# Patient Record
Sex: Female | Born: 1950
Health system: Southern US, Community
[De-identification: ages and names within clinical notes are randomized; demographics above are authoritative.]

## PROBLEM LIST (undated history)

## (undated) DIAGNOSIS — M1990 Unspecified osteoarthritis, unspecified site: Secondary | ICD-10-CM

## (undated) DIAGNOSIS — E119 Type 2 diabetes mellitus without complications: Secondary | ICD-10-CM

## (undated) HISTORY — DX: Unspecified osteoarthritis, unspecified site: M19.90

## (undated) HISTORY — DX: Type 2 diabetes mellitus without complications: E11.9

## (undated) HISTORY — PX: TUBAL LIGATION: SHX77

---

## 2004-04-08 ENCOUNTER — Emergency Department (HOSPITAL_COMMUNITY): Admission: EM | Admit: 2004-04-08 | Discharge: 2004-04-08 | Payer: Self-pay | Admitting: Family Medicine

## 2004-04-13 ENCOUNTER — Emergency Department (HOSPITAL_COMMUNITY): Admission: EM | Admit: 2004-04-13 | Discharge: 2004-04-13 | Payer: Self-pay | Admitting: Family Medicine

## 2005-12-23 ENCOUNTER — Encounter: Admission: RE | Admit: 2005-12-23 | Discharge: 2005-12-23 | Payer: Self-pay | Admitting: Internal Medicine

## 2006-12-24 ENCOUNTER — Encounter: Admission: RE | Admit: 2006-12-24 | Discharge: 2006-12-24 | Payer: Self-pay | Admitting: Internal Medicine

## 2007-12-27 ENCOUNTER — Encounter: Admission: RE | Admit: 2007-12-27 | Discharge: 2007-12-27 | Payer: Self-pay | Admitting: Internal Medicine

## 2009-01-29 ENCOUNTER — Encounter: Admission: RE | Admit: 2009-01-29 | Discharge: 2009-01-29 | Payer: Self-pay | Admitting: Internal Medicine

## 2009-02-28 ENCOUNTER — Encounter: Admission: RE | Admit: 2009-02-28 | Discharge: 2009-02-28 | Payer: Self-pay | Admitting: Obstetrics and Gynecology

## 2009-04-18 ENCOUNTER — Ambulatory Visit: Admission: RE | Admit: 2009-04-18 | Discharge: 2009-04-18 | Payer: Self-pay | Admitting: Gynecologic Oncology

## 2010-02-04 ENCOUNTER — Encounter: Admission: RE | Admit: 2010-02-04 | Discharge: 2010-02-04 | Payer: Self-pay | Admitting: Internal Medicine

## 2011-01-11 ENCOUNTER — Other Ambulatory Visit: Payer: Self-pay | Admitting: Internal Medicine

## 2011-01-11 DIAGNOSIS — Z1231 Encounter for screening mammogram for malignant neoplasm of breast: Secondary | ICD-10-CM

## 2011-02-05 ENCOUNTER — Ambulatory Visit
Admission: RE | Admit: 2011-02-05 | Discharge: 2011-02-05 | Disposition: A | Discharge status: Home / Self Care | Payer: 59 | Source: Ambulatory Visit | Attending: Internal Medicine | Admitting: Internal Medicine

## 2011-02-05 DIAGNOSIS — Z1231 Encounter for screening mammogram for malignant neoplasm of breast: Secondary | ICD-10-CM
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
## 2012-01-05 ENCOUNTER — Other Ambulatory Visit: Payer: Self-pay | Admitting: Internal Medicine

## 2012-01-05 DIAGNOSIS — Z1231 Encounter for screening mammogram for malignant neoplasm of breast: Secondary | ICD-10-CM

## 2012-02-09 ENCOUNTER — Ambulatory Visit
Admission: RE | Admit: 2012-02-09 | Discharge: 2012-02-09 | Disposition: A | Discharge status: Home / Self Care | Payer: 59 | Source: Ambulatory Visit | Attending: Internal Medicine | Admitting: Internal Medicine

## 2012-02-09 DIAGNOSIS — Z1231 Encounter for screening mammogram for malignant neoplasm of breast: Secondary | ICD-10-CM

## 2012-08-25 ENCOUNTER — Ambulatory Visit
Admission: RE | Admit: 2012-08-25 | Discharge: 2012-08-25 | Disposition: A | Discharge status: Home / Self Care | Payer: 59 | Source: Ambulatory Visit | Attending: Internal Medicine | Admitting: Internal Medicine

## 2012-08-25 ENCOUNTER — Other Ambulatory Visit: Payer: Self-pay | Admitting: Internal Medicine

## 2012-08-25 DIAGNOSIS — R05 Cough: Secondary | ICD-10-CM

## 2012-09-08 ENCOUNTER — Ambulatory Visit (HOSPITAL_COMMUNITY)
Admission: RE | Admit: 2012-09-08 | Discharge: 2012-09-08 | Disposition: A | Discharge status: Home / Self Care | Payer: 59 | Source: Ambulatory Visit | Attending: Internal Medicine | Admitting: Internal Medicine

## 2012-09-08 DIAGNOSIS — R1310 Dysphagia, unspecified: Secondary | ICD-10-CM | POA: Insufficient documentation

## 2012-09-08 NOTE — Procedures (Signed)
Objective Swallowing Evaluation: Modified Barium Swallowing Study  Patient Details  Name: [name] W Carra MRN: [id number] Date of Birth: February 10, 1951  Today's Date: 09/08/2012 Time: 1140-1200 SLP Time Calculation (min): 20 min  Past Medical History: No past medical history on file. Past Surgical History: No past surgical history on file. HPI:  61 year old female with medical history of prediabetes seen for outpatient MBS due to recent c/o new onset dry cough.  After further questioning, patient also reports new onset hoarse vocal quality and occassional globus.      Assessment / Plan / Recommendation Clinical Impression  Dysphagia Diagnosis: Within Functional Limits Clinical impression: Patient presents with a functional oropharyngeal swallow. Full airway protection noted with no penetration or aspiration. Occassional delayed swallow initiation and trace pharyngeal coating noted which is within normal limits. This SLP suspects that symtoms may be due to a primary esophageal dysfunction, ? GERD vs LPR, specifically due to her dry cough and hoarse vocal quality. Defer further w/o and/or medication management of symptoms to MD. No SLP f/u indicated.     Treatment Recommendation  No treatment recommended at this time    Diet Recommendation Regular;Thin liquid   Liquid Administration via: Straw;Cup Medication Administration: Whole meds with liquid Supervision: Patient able to self feed Compensations: Slow rate;Small sips/bites Postural Changes and/or Swallow Maneuvers: Seated upright 90 degrees;Upright 30-60 min after meal    Other  Recommendations Oral Care Recommendations: Oral care BID   Follow Up Recommendations  None             General HPI: 61 year old female with medical history of prediabetes seen for outpatient MBS due to recent c/o new onset dry cough.  After further questioning, patient also reports new onset hoarse vocal quality and occassional globus.  Type of Study:  Modified Barium Swallowing Study Reason for Referral: Objectively evaluate swallowing function Diet Prior to this Study: Regular;Thin liquids Temperature Spikes Noted: No Respiratory Status: Room air History of Recent Intubation: No Behavior/Cognition: Alert;Cooperative;Pleasant mood Oral Cavity - Dentition: Adequate natural dentition Oral Motor / Sensory Function: Within functional limits Self-Feeding Abilities: Able to feed self Patient Positioning: Upright in chair Baseline Vocal Quality: Clear Volitional Cough: Strong Volitional Swallow: Able to elicit Anatomy: Within functional limits Pharyngeal Secretions: Not observed secondary MBS    Reason for Referral Objectively evaluate swallowing function   Oral Phase Oral Preparation/Oral Phase Oral Phase: WFL   Pharyngeal Phase Pharyngeal Phase Pharyngeal Phase: Within functional limits  Cervical Esophageal Phase    GO  Ferdinand Lango MA, CCC-SLP [phone]   Cervical Esophageal Phase Cervical Esophageal Phase: [facility]         Faizon Capozzi Meryl 09/08/2012, 1:57 PM

## 2012-12-30 ENCOUNTER — Other Ambulatory Visit: Payer: Self-pay | Admitting: Internal Medicine

## 2012-12-30 DIAGNOSIS — Z1231 Encounter for screening mammogram for malignant neoplasm of breast: Secondary | ICD-10-CM

## 2013-02-09 ENCOUNTER — Ambulatory Visit
Admission: RE | Admit: 2013-02-09 | Discharge: 2013-02-09 | Disposition: A | Discharge status: Home / Self Care | Payer: 59 | Source: Ambulatory Visit | Attending: Internal Medicine | Admitting: Internal Medicine

## 2013-02-09 DIAGNOSIS — Z1231 Encounter for screening mammogram for malignant neoplasm of breast: Secondary | ICD-10-CM

## 2014-01-10 ENCOUNTER — Other Ambulatory Visit: Payer: Self-pay

## 2014-01-10 DIAGNOSIS — Z1231 Encounter for screening mammogram for malignant neoplasm of breast: Secondary | ICD-10-CM

## 2014-02-13 ENCOUNTER — Ambulatory Visit
Admission: RE | Admit: 2014-02-13 | Discharge: 2014-02-13 | Disposition: A | Discharge status: Home / Self Care | Payer: 59 | Source: Ambulatory Visit

## 2014-02-13 DIAGNOSIS — Z1231 Encounter for screening mammogram for malignant neoplasm of breast: Secondary | ICD-10-CM

## 2015-01-11 ENCOUNTER — Other Ambulatory Visit: Payer: Self-pay

## 2015-01-11 DIAGNOSIS — Z1231 Encounter for screening mammogram for malignant neoplasm of breast: Secondary | ICD-10-CM

## 2015-02-14 ENCOUNTER — Ambulatory Visit
Admission: RE | Admit: 2015-02-14 | Discharge: 2015-02-14 | Disposition: A | Discharge status: Home / Self Care | Payer: 59 | Source: Ambulatory Visit

## 2015-02-14 ENCOUNTER — Encounter (INDEPENDENT_AMBULATORY_CARE_PROVIDER_SITE_OTHER): Payer: Self-pay

## 2015-02-14 DIAGNOSIS — Z1231 Encounter for screening mammogram for malignant neoplasm of breast: Secondary | ICD-10-CM

## 2015-06-14 ENCOUNTER — Other Ambulatory Visit: Payer: Self-pay | Admitting: Obstetrics and Gynecology

## 2015-06-14 NOTE — Anesthesia Preprocedure Evaluation (Addendum)
Anesthesia Evaluation  Patient identified by MRN, date of birth, ID band Patient awake    Reviewed: Allergy & Precautions, NPO status , Patient's Chart, lab work & pertinent test results, reviewed documented beta blocker date and time   Airway Mallampati: II   Neck ROM: Full    Dental  (+) Partial Upper, Dental Advisory Given   Pulmonary neg pulmonary ROS, former smoker,  breath sounds clear to auscultation        Cardiovascular negative cardio ROS  Rhythm:Regular     Neuro/Psych negative neurological ROS  negative psych ROS   GI/Hepatic negative GI ROS, Neg liver ROS,   Endo/Other  diabetes, Oral Hypoglycemic Agents  Renal/GU negative Renal ROS     Musculoskeletal   Abdominal (+)  Abdomen: soft.    Peds  Hematology 12/36   Anesthesia Other Findings   Reproductive/Obstetrics                         Anesthesia Physical Anesthesia Plan  ASA: II  Anesthesia Plan: General   Post-op Pain Management:    Induction: Intravenous  Airway Management Planned: Oral ETT  Additional Equipment:   Intra-op Plan:   Post-operative Plan: Extubation in OR  Informed Consent: I have reviewed the patients History and Physical, chart, labs and discussed the procedure including the risks, benefits and alternatives for the proposed anesthesia with the patient or authorized representative who has indicated his/her understanding and acceptance.     Plan Discussed with:   Anesthesia Plan Comments:         Anesthesia Quick Evaluation

## 2015-06-15 ENCOUNTER — Encounter (HOSPITAL_COMMUNITY)
Admission: RE | Admit: 2015-06-15 | Discharge: 2015-06-15 | Disposition: A | Discharge status: Home / Self Care | Payer: 59 | Source: Ambulatory Visit | Attending: Obstetrics and Gynecology | Admitting: Obstetrics and Gynecology

## 2015-06-15 ENCOUNTER — Other Ambulatory Visit: Payer: Self-pay

## 2015-06-15 ENCOUNTER — Encounter (HOSPITAL_COMMUNITY): Payer: Self-pay

## 2015-06-15 DIAGNOSIS — Z01818 Encounter for other preprocedural examination: Secondary | ICD-10-CM | POA: Diagnosis not present

## 2015-06-15 LAB — BASIC METABOLIC PANEL
Anion gap: 7 (ref 5–15)
BUN: 17 mg/dL (ref 6–20)
CO2: 26 mmol/L (ref 22–32)
Calcium: 9.6 mg/dL (ref 8.9–10.3)
Chloride: 106 mmol/L (ref 101–111)
Creatinine, Ser: 0.78 mg/dL (ref 0.44–1.00)
Glucose, Bld: 132 mg/dL — ABNORMAL HIGH (ref 65–99)
Potassium: 4.2 mmol/L (ref 3.5–5.1)
Sodium: 139 mmol/L (ref 135–145)

## 2015-06-15 LAB — CBC
HCT: 36.2 % (ref 36.0–46.0)
Hemoglobin: 12.5 g/dL (ref 12.0–15.0)
MCH: 29.6 pg (ref 26.0–34.0)
MCHC: 34.5 g/dL (ref 30.0–36.0)
MCV: 85.6 fL (ref 78.0–100.0)
PLATELETS: 218 10*3/uL (ref 150–400)
RBC: 4.23 MIL/uL (ref 3.87–5.11)
RDW: 12.2 % (ref 11.5–15.5)
WBC: 6.6 10*3/uL (ref 4.0–10.5)

## 2015-06-15 NOTE — Patient Instructions (Signed)
Your procedure is scheduled on:  June 21, 2015  Enter through the Main Entrance of [facility] at:  11:15 am    Pick up the phone at the desk and dial [phone].  Call this number if you have problems the morning of surgery: [phone].  Remember: Do NOT eat food:  After midnight on Wednesday  Do NOT drink clear liquids after:  0845 day of surgery  Take these medicines the morning of surgery with a SIP OF WATER:  None  DO NOT TAKE METFORMIN 24 HOURS PRIOR TO SURGERY   Do NOT wear jewelry (body piercing), metal hair clips/bobby pins, make-up, or nail polish. Do NOT wear lotions, powders, or perfumes.  You may wear deoderant. Do NOT shave for 48 hours prior to surgery. Do NOT bring valuables to the hospital. Contacts, dentures, or bridgework may not be worn into surgery. Have a responsible adult drive you home and stay with you for 24 hours after your procedure

## 2015-06-21 ENCOUNTER — Ambulatory Visit (HOSPITAL_COMMUNITY)
Admission: RE | Admit: 2015-06-21 | Discharge: 2015-06-21 | Disposition: A | Discharge status: Home / Self Care | Payer: 59 | Source: Ambulatory Visit | Attending: Obstetrics and Gynecology | Admitting: Obstetrics and Gynecology

## 2015-06-21 ENCOUNTER — Ambulatory Visit (HOSPITAL_COMMUNITY): Payer: 59 | Admitting: Anesthesiology

## 2015-06-21 ENCOUNTER — Encounter (HOSPITAL_COMMUNITY): Payer: Self-pay | Admitting: Emergency Medicine

## 2015-06-21 ENCOUNTER — Encounter (HOSPITAL_COMMUNITY)
Admission: RE | Disposition: A | Discharge status: Home / Self Care | Payer: Self-pay | Source: Ambulatory Visit | Attending: Obstetrics and Gynecology

## 2015-06-21 DIAGNOSIS — D271 Benign neoplasm of left ovary: Secondary | ICD-10-CM | POA: Diagnosis not present

## 2015-06-21 DIAGNOSIS — N8320 Unspecified ovarian cysts: Secondary | ICD-10-CM | POA: Diagnosis present

## 2015-06-21 DIAGNOSIS — Z79899 Other long term (current) drug therapy: Secondary | ICD-10-CM | POA: Diagnosis not present

## 2015-06-21 DIAGNOSIS — N950 Postmenopausal bleeding: Secondary | ICD-10-CM | POA: Diagnosis not present

## 2015-06-21 DIAGNOSIS — N840 Polyp of corpus uteri: Secondary | ICD-10-CM | POA: Insufficient documentation

## 2015-06-21 DIAGNOSIS — Z87891 Personal history of nicotine dependence: Secondary | ICD-10-CM | POA: Diagnosis not present

## 2015-06-21 DIAGNOSIS — E119 Type 2 diabetes mellitus without complications: Secondary | ICD-10-CM | POA: Diagnosis not present

## 2015-06-21 DIAGNOSIS — N838 Other noninflammatory disorders of ovary, fallopian tube and broad ligament: Secondary | ICD-10-CM | POA: Diagnosis not present

## 2015-06-21 DIAGNOSIS — Z9851 Tubal ligation status: Secondary | ICD-10-CM | POA: Insufficient documentation

## 2015-06-21 HISTORY — PX: ROBOTIC ASSISTED SALPINGO OOPHERECTOMY: SHX6082

## 2015-06-21 HISTORY — PX: DILATATION & CURRETTAGE/HYSTEROSCOPY WITH RESECTOCOPE: SHX5572

## 2015-06-21 LAB — GLUCOSE, CAPILLARY: Glucose-Capillary: 133 mg/dL — ABNORMAL HIGH (ref 65–99)

## 2015-06-21 SURGERY — ROBOTIC ASSISTED SALPINGO OOPHORECTOMY
Anesthesia: General | Site: Vagina

## 2015-06-21 MED ORDER — GLYCOPYRROLATE 0.2 MG/ML IJ SOLN
INTRAMUSCULAR | Status: AC
  Filled 2015-06-21: qty 3

## 2015-06-21 MED ORDER — OXYCODONE-ACETAMINOPHEN 5-325 MG PO TABS: 1.0000 | ORAL_TABLET | Freq: Four times a day (QID) | ORAL | Status: DC | PRN

## 2015-06-21 MED ORDER — ARTIFICIAL TEARS OP OINT
TOPICAL_OINTMENT | OPHTHALMIC | Status: AC
Start: 2015-06-21 — End: 2015-06-21
  Filled 2015-06-21: qty 3.5

## 2015-06-21 MED ORDER — GLYCOPYRROLATE 0.2 MG/ML IJ SOLN
INTRAMUSCULAR | Status: DC | PRN
  Administered 2015-06-21: 0.6 mg via INTRAVENOUS

## 2015-06-21 MED ORDER — BUPIVACAINE HCL (PF) 0.25 % IJ SOLN
INTRAMUSCULAR | Status: DC | PRN
  Administered 2015-06-21: 15 mL

## 2015-06-21 MED ORDER — GLYCINE 1.5 % IR SOLN
Status: DC | PRN
  Administered 2015-06-21: 3000 mL

## 2015-06-21 MED ORDER — LACTATED RINGERS IR SOLN
Status: DC | PRN
  Administered 2015-06-21: 3000 mL

## 2015-06-21 MED ORDER — SODIUM CHLORIDE 0.9 % IR SOLN
Status: DC | PRN
  Administered 2015-06-21: 3000 mL

## 2015-06-21 MED ORDER — PROMETHAZINE HCL 25 MG/ML IJ SOLN
6.2500 mg | INTRAMUSCULAR | Status: DC | PRN
Start: 2015-06-21 — End: 2015-06-21

## 2015-06-21 MED ORDER — ONDANSETRON HCL 4 MG/2ML IJ SOLN
INTRAMUSCULAR | Status: DC | PRN
  Administered 2015-06-21: 4 mg via INTRAVENOUS

## 2015-06-21 MED ORDER — MIDAZOLAM HCL 2 MG/2ML IJ SOLN
INTRAMUSCULAR | Status: AC
  Filled 2015-06-21: qty 2

## 2015-06-21 MED ORDER — PROPOFOL 10 MG/ML IV BOLUS
INTRAVENOUS | Status: DC | PRN
  Administered 2015-06-21: 200 mg via INTRAVENOUS

## 2015-06-21 MED ORDER — NEOSTIGMINE METHYLSULFATE 10 MG/10ML IV SOLN
INTRAVENOUS | Status: AC
  Filled 2015-06-21: qty 1

## 2015-06-21 MED ORDER — BUPIVACAINE HCL (PF) 0.25 % IJ SOLN
INTRAMUSCULAR | Status: AC
  Filled 2015-06-21: qty 30

## 2015-06-21 MED ORDER — ONDANSETRON HCL 4 MG/2ML IJ SOLN
INTRAMUSCULAR | Status: AC
  Filled 2015-06-21: qty 2

## 2015-06-21 MED ORDER — HEPARIN SODIUM (PORCINE) 5000 UNIT/ML IJ SOLN
INTRAMUSCULAR | Status: AC
  Filled 2015-06-21: qty 1

## 2015-06-21 MED ORDER — SODIUM CHLORIDE 0.9 % IJ SOLN
INTRAMUSCULAR | Status: AC
  Filled 2015-06-21: qty 50

## 2015-06-21 MED ORDER — FENTANYL CITRATE (PF) 100 MCG/2ML IJ SOLN
INTRAMUSCULAR | Status: AC
  Filled 2015-06-21: qty 2

## 2015-06-21 MED ORDER — ROCURONIUM BROMIDE 100 MG/10ML IV SOLN
INTRAVENOUS | Status: DC | PRN
  Administered 2015-06-21 (×2): 10 mg via INTRAVENOUS
  Administered 2015-06-21: 50 mg via INTRAVENOUS

## 2015-06-21 MED ORDER — PHENYLEPHRINE 40 MCG/ML (10ML) SYRINGE FOR IV PUSH (FOR BLOOD PRESSURE SUPPORT)
PREFILLED_SYRINGE | INTRAVENOUS | Status: AC
  Filled 2015-06-21: qty 10

## 2015-06-21 MED ORDER — LACTATED RINGERS IV SOLN
INTRAVENOUS | Status: DC
  Administered 2015-06-21 (×2): via INTRAVENOUS

## 2015-06-21 MED ORDER — FENTANYL CITRATE (PF) 100 MCG/2ML IJ SOLN
INTRAMUSCULAR | Status: DC | PRN
  Administered 2015-06-21 (×6): 50 ug via INTRAVENOUS

## 2015-06-21 MED ORDER — MEPERIDINE HCL 25 MG/ML IJ SOLN: 6.2500 mg | INTRAMUSCULAR | Status: DC | PRN

## 2015-06-21 MED ORDER — DEXAMETHASONE SODIUM PHOSPHATE 4 MG/ML IJ SOLN
INTRAMUSCULAR | Status: DC | PRN
  Administered 2015-06-21: 4 mg via INTRAVENOUS

## 2015-06-21 MED ORDER — ROPIVACAINE HCL 5 MG/ML IJ SOLN
INTRAMUSCULAR | Status: AC
  Filled 2015-06-21: qty 30

## 2015-06-21 MED ORDER — LIDOCAINE HCL (CARDIAC) 20 MG/ML IV SOLN
INTRAVENOUS | Status: DC | PRN
  Administered 2015-06-21: 50 mg via INTRAVENOUS

## 2015-06-21 MED ORDER — DEXAMETHASONE SODIUM PHOSPHATE 10 MG/ML IJ SOLN
INTRAMUSCULAR | Status: AC
  Filled 2015-06-21: qty 1

## 2015-06-21 MED ORDER — LIDOCAINE HCL (CARDIAC) 20 MG/ML IV SOLN
INTRAVENOUS | Status: AC
  Filled 2015-06-21: qty 5

## 2015-06-21 MED ORDER — FENTANYL CITRATE (PF) 100 MCG/2ML IJ SOLN
INTRAMUSCULAR | Status: AC
Start: 2015-06-21 — End: 2015-06-21
  Filled 2015-06-21: qty 2

## 2015-06-21 MED ORDER — NEOSTIGMINE METHYLSULFATE 10 MG/10ML IV SOLN
INTRAVENOUS | Status: DC | PRN
  Administered 2015-06-21: 4 mg via INTRAVENOUS

## 2015-06-21 MED ORDER — KETOROLAC TROMETHAMINE 30 MG/ML IJ SOLN
INTRAMUSCULAR | Status: DC | PRN
  Administered 2015-06-21: 30 mg via INTRAVENOUS

## 2015-06-21 MED ORDER — PROPOFOL 10 MG/ML IV BOLUS
INTRAVENOUS | Status: AC
  Filled 2015-06-21: qty 20

## 2015-06-21 MED ORDER — ROPIVACAINE HCL 5 MG/ML IJ SOLN
INTRAMUSCULAR | Status: DC | PRN
  Administered 2015-06-21: 60 mL

## 2015-06-21 MED ORDER — FENTANYL CITRATE (PF) 100 MCG/2ML IJ SOLN
25.0000 ug | INTRAMUSCULAR | Status: DC | PRN
  Administered 2015-06-21: 50 ug via INTRAVENOUS

## 2015-06-21 SURGICAL SUPPLY — 61 items
BARRIER ADHS 3X4 INTERCEED (GAUZE/BANDAGES/DRESSINGS) ×4 IMPLANT
CANISTER SUCT 3000ML (MISCELLANEOUS) ×8 IMPLANT
CATH ROBINSON RED A/P 16FR (CATHETERS) IMPLANT
CHLORAPREP W/TINT 26ML (MISCELLANEOUS) ×4 IMPLANT
CLOTH BEACON ORANGE TIMEOUT ST (SAFETY) ×4 IMPLANT
CONT PATH 16OZ SNAP LID 3702 (MISCELLANEOUS) ×4 IMPLANT
CONTAINER PREFILL 10% NBF 60ML (FORM) ×8 IMPLANT
COVER BACK TABLE 60X90IN (DRAPES) ×8 IMPLANT
COVER TIP SHEARS 8 DVNC (MISCELLANEOUS) ×2 IMPLANT
COVER TIP SHEARS 8MM DA VINCI (MISCELLANEOUS) ×2
DECANTER SPIKE VIAL GLASS SM (MISCELLANEOUS) ×4 IMPLANT
DRAPE WARM FLUID 44X44 (DRAPE) ×4 IMPLANT
DRSG OPSITE POSTOP 3X4 (GAUZE/BANDAGES/DRESSINGS) ×4 IMPLANT
ELECT REM PT RETURN 9FT ADLT (ELECTROSURGICAL) ×4
ELECTRODE REM PT RTRN 9FT ADLT (ELECTROSURGICAL) ×2 IMPLANT
GAUZE VASELINE 3X9 (GAUZE/BANDAGES/DRESSINGS) IMPLANT
GLOVE BIOGEL PI IND STRL 7.0 (GLOVE) ×4 IMPLANT
GLOVE BIOGEL PI INDICATOR 7.0 (GLOVE) ×4
GLOVE ECLIPSE 6.5 STRL STRAW (GLOVE) ×4 IMPLANT
GOWN STRL REUS W/TWL LRG LVL3 (GOWN DISPOSABLE) ×8 IMPLANT
KIT ACCESSORY DA VINCI DISP (KITS) ×2
KIT ACCESSORY DVNC DISP (KITS) ×2 IMPLANT
LEGGING LITHOTOMY PAIR STRL (DRAPES) ×4 IMPLANT
LIQUID BAND (GAUZE/BANDAGES/DRESSINGS) ×4 IMPLANT
LOOP ANGLED CUTTING 22FR (CUTTING LOOP) ×4 IMPLANT
NEEDLE INSUFFLATION 120MM (ENDOMECHANICALS) ×4 IMPLANT
NS IRRIG 1000ML POUR BTL (IV SOLUTION) ×12 IMPLANT
OCCLUDER COLPOPNEUMO (BALLOONS) ×4 IMPLANT
PACK ROBOT WH (CUSTOM PROCEDURE TRAY) ×4 IMPLANT
PACK ROBOTIC GOWN (GOWN DISPOSABLE) ×4 IMPLANT
PACK VAGINAL MINOR WOMEN LF (CUSTOM PROCEDURE TRAY) ×4 IMPLANT
PAD OB MATERNITY 4.3X12.25 (PERSONAL CARE ITEMS) ×4 IMPLANT
PAD POSITIONER PINK NONSTERILE (MISCELLANEOUS) ×4 IMPLANT
PAD PREP 24X48 CUFFED NSTRL (MISCELLANEOUS) ×8 IMPLANT
SCISSORS LAP 5X35 DISP (ENDOMECHANICALS) IMPLANT
SET CYSTO W/LG BORE CLAMP LF (SET/KITS/TRAYS/PACK) IMPLANT
SET IRRIG TUBING LAPAROSCOPIC (IRRIGATION / IRRIGATOR) ×4 IMPLANT
SET TRI-LUMEN FLTR TB AIRSEAL (TUBING) ×4 IMPLANT
SUT VIC AB 0 CT1 27 (SUTURE) ×10
SUT VIC AB 0 CT1 27XBRD ANTBC (SUTURE) ×10 IMPLANT
SUT VICRYL 0 UR6 27IN ABS (SUTURE) ×4 IMPLANT
SUT VICRYL 4-0 PS2 18IN ABS (SUTURE) ×8 IMPLANT
SYR 50ML LL SCALE MARK (SYRINGE) ×4 IMPLANT
SYSTEM CONVERTIBLE TROCAR (TROCAR) ×4 IMPLANT
TIP UTERINE 5.1X6CM LAV DISP (MISCELLANEOUS) IMPLANT
TIP UTERINE 6.7X10CM GRN DISP (MISCELLANEOUS) IMPLANT
TIP UTERINE 6.7X6CM WHT DISP (MISCELLANEOUS) IMPLANT
TIP UTERINE 6.7X8CM BLUE DISP (MISCELLANEOUS) ×4 IMPLANT
TOWEL OR 17X24 6PK STRL BLUE (TOWEL DISPOSABLE) ×12 IMPLANT
TRAY FOLEY BAG SILVER LF 16FR (SET/KITS/TRAYS/PACK) ×4 IMPLANT
TROCAR 12M 150ML BLUNT (TROCAR) IMPLANT
TROCAR DISP BLADELESS 8 DVNC (TROCAR) ×2 IMPLANT
TROCAR DISP BLADELESS 8MM (TROCAR) ×2
TROCAR OPTI TIP 12M 100M (ENDOMECHANICALS) IMPLANT
TROCAR PORT AIRSEAL 5X120 (TROCAR) ×8 IMPLANT
TROCAR XCEL NON-BLD 5MMX100MML (ENDOMECHANICALS) IMPLANT
TROCAR Z-THREAD 12X150 (TROCAR) ×4 IMPLANT
TUBING AQUILEX INFLOW (TUBING) ×4 IMPLANT
TUBING AQUILEX OUTFLOW (TUBING) ×4 IMPLANT
WARMER LAPAROSCOPE (MISCELLANEOUS) ×4 IMPLANT
WATER STERILE IRR 1000ML POUR (IV SOLUTION) ×4 IMPLANT

## 2015-06-21 NOTE — Brief Op Note (Signed)
06/21/2015  2:43 PM  PATIENT:  Leah Robinson  64 y.o. female  PRE-OPERATIVE DIAGNOSIS:  Left Complex Adnexal Mass, Postmenopausal Bleeding, Endometrial Mass  POST-OPERATIVE DIAGNOSIS:  left complex adnexal mass,postmenopausal bleeding, submucosal fibroids  PROCEDURE:  Diagnostic hysterectomy, hysteroscopic resection of submucosal fibroids, dilation and curettage, da vinci robotic left salpingo-oophorectomy, right salpingectomy, pelvic washings,   SURGEON:  Surgeon(s) and Role:    * Servando Salina, MD - Primary  PHYSICIAN ASSISTANT:   ASSISTANTS: Laury Deep, CNM   ANESTHESIA:   general Findings: right tube with falope ring, left tube and ovary cystic enlarged and attached to left pelvic sidewall, left bowel adhesions, fibroid uterus, multiple small sm fibroids EBL:  Total I/O In: 1500 [I.V.:1500] Out: 425 [Urine:400; Blood:25]  BLOOD ADMINISTERED:none  DRAINS: none   LOCAL MEDICATIONS USED:  MARCAINE    and BUPIVICAINE   SPECIMEN:  Source of Specimen:  lso, right tube, pelvic washing  DISPOSITION OF SPECIMEN:  PATHOLOGY  COUNTS:  YES  TOURNIQUET:  * No tourniquets in log *  DICTATION: .Other Dictation: Dictation Number [id number]  PLAN OF CARE: Discharge to home after PACU  PATIENT DISPOSITION:  PACU - hemodynamically stable.   Delay start of Pharmacological VTE agent (>24hrs) due to surgical blood loss or risk of bleeding: no

## 2015-06-21 NOTE — Transfer of Care (Signed)
Immediate Anesthesia Transfer of Care Note  Patient: Leah Robinson  Procedure(s) Performed: Procedure(s) with comments: ROBOTIC ASSISTED LEFT SALPINGO OOPHORECTOMY AND RIGHT SALPINGECTOMY With  Pelvic Washings (Left) - 2 1/2 hrs. DILATATION & CURETTAGE/HYSTEROSCOPY WITH RESECTOCOPE (N/A)  Patient Location: PACU  Anesthesia Type:General  Level of Consciousness: awake, alert , oriented and patient cooperative  Airway & Oxygen Therapy: Patient Spontanous Breathing and Patient connected to nasal cannula oxygen  Post-op Assessment: Report given to RN, Post -op Vital signs reviewed and stable and Patient moving all extremities X 4  Post vital signs: Reviewed and stable  Last Vitals:  Filed Vitals:   06/21/15 1034  BP: 145/68  Pulse: 66  Temp: 36.8 C  Resp: 16    Complications: No apparent anesthesia complications

## 2015-06-21 NOTE — Discharge Instructions (Signed)

## 2015-06-21 NOTE — Anesthesia Procedure Notes (Signed)
Procedure Name: Intubation Date/Time: 06/21/2015 11:16 AM Performed by: Gilmer Mor R Pre-anesthesia Checklist: Patient identified, Emergency Drugs available, Suction available, Patient being monitored and Timeout performed Patient Re-evaluated:Patient Re-evaluated prior to inductionOxygen Delivery Method: Circle system utilized Preoxygenation: Pre-oxygenation with 100% oxygen Intubation Type: IV induction Ventilation: Mask ventilation without difficulty Laryngoscope Size: Mac and 3 Tube type: Oral Tube size: 7.0 mm Number of attempts: 1 Placement Confirmation: ETT inserted through vocal cords under direct vision,  positive ETCO2 and breath sounds checked- equal and bilateral Secured at: 21 cm Tube secured with: Tape Dental Injury: Teeth and Oropharynx as per pre-operative assessment

## 2015-06-22 NOTE — Op Note (Signed)
NAME:  Leah Robinson, Leah Robinson              ACCOUNT NO.:  [id number]  MEDICAL RECORD NO.:  [id number]  LOCATION:  [facility]                          FACILITY:  [place]  PHYSICIAN:  Servando Salina, M.D.DATE OF BIRTH:  06/18/1951  DATE OF PROCEDURE:  06/21/2015 DATE OF DISCHARGE:  06/21/2015                              OPERATIVE REPORT   PREOPERATIVE DIAGNOSES:  Postmenopausal bleeding, endometrial polyp, persistent left complex ovarian cyst.  PROCEDURES:  Diagnostic hysteroscopy, hysteroscopic resection of submucosal fibroid, dilation and curettage, da Vinci robotic left salpingo-oophorectomy, right salpingectomy.  POSTOPERATIVE DIAGNOSES:  Left ovarian dermoid cyst, postmenopausal bleeding, submucosal fibroids.  ANESTHESIA:  General.  SURGEON:  Servando Salina, M.D.  ASSISTANT:  Laury Deep, CNM  DESCRIPTION OF PROCEDURE:  Under adequate general anesthesia, the patient was placed in the dorsal lithotomy position.  She was sterilely prepped and draped in usual fashion.  The bladder was not initially catheterized.  Examination under anesthesia revealed an irregular anteverted uterus with left adnexal fullness noted.  The patient was positioned for robotic surgery.  She has had a prior tubal ligation.  A bivalve speculum was placed in the vagina.  Single-tooth tenaculum was placed on the anterior lip of the cervix.  The cervix was serially dilated up to #27 [facility] dilator.  The resectoscope with a single loop was then introduced into the uterine cavity.  Other than an irregular cystic cavity, particularly in the fundal area, small submucosal fibroid was noted.  No endometrial polyps seen.  The tubal ostia were sclerosed. Nonetheless, using the small loop, the small submucosal fibroids were resected.  The resectoscope was removed.  The cavity was gently curetted for scant amount of tissue.  The resectoscope was inserted.  No other lesions were noted.  The endocervical canal was without  any lesions.  At that point, the resectoscope was removed.  Uterus had sounded to 9 cm and therefore a # 8 uterine manipulator without the Rumi cup was placed.  I then went to the patient's left side.  The patient's prior infraumbilical scar was noted.  Marcaine 0.25% was injected at that site.  Infraumbilical incision was then made and the umbilical site was opened, carried down to the rectus fascia.  Rectus fascia was then grasped and opened in a vertical fashion.  The rectus muscle was then noted.  The parietal peritoneum was subsequently identified and opened. Pursestring suture 0 Vicryl was then placed on fascia.  The Hasson cannula with 12 mm trocar was introduced into the abdomen and a lighted video laparoscope was then inserted, confirming entering the abdomen was without incident.  The patient had a  fatty omentum .  The liver could not be seen with this omental fat.  Nonetheless, with the abdomen subsequently distended and the patient placed in Trendelenburg position, the pelvis was inspected.  There was evidence of prior tubal ligation with the Falope ring that was noted.  On the left, there was proximal portion of the fallopian tube segment, was attached to some bowel/omentum area.  The left ovary was involved in the cul-de-sac on the left side and clearly was enlarged and cystic in appearance.  The right ovary was normal.  The right tube other than  the surgical separation was normal.  The patient was taken out of Trendelenburg.  500 mL of fluid was instilled in the abdomen and basically almost 500 mL was obtained for pelvic washings.  The patient was placed back in Trendelenburg.  The additional robotic port sites were then placed with 2 being used, 1 on the right, 1 on the left, and the AirSeal was used for insufflation as well as the assistant port.  The robot was then docked to the patient's left side.  I then went to the surgical console. At the surgical console, the  pelvis was once again inspected and in particular the left ovary, it was then noted that there were some bowel adhesions to the left pelvic brim which was obscuring the view to the Ureter.  The ureter still was not clearly seen.  It was then noted that the ovary with the cyst was attached to the pelvic sidewall and bearing into the retroperitoneal space.  The procedure was started with right fallopian tube, the mesosalpinx was serially clamped, cauterized, and then cut and the tube removed including the Falope ring. On the left side, the retroperitoneal space was opened, however, there was scarring in that area as well, and attempt to lift the ovary off the pelvic side wall resulted in incidental rupture of one of the cysts which was clear serous appearing fluid.  Subsequently as this being lifted further, that was ruptured, showed sebaceous material consistent with a dermoid.  The ureter again was attempted to be identified.  The left retroperitoneal space was then attempted to be opened, but was not amenable to dissection that well either from the medial or lateral side of the retroperitoneum.  A decision was then made to start with the proximal utero-ovarian ligament which was then carefully clamped, cauterized, and then cut.  Brought that down close to the ovary and utilizing that to open up the retroperitoneal space on that side and continued the dissection.  Once the scar tissue in that retroperitoneal space was dissected further, as the ovary was being lifted up and brought up, I was then able to see the retroperitoneal space better, subsequently find the ureter proximally deep in the pelvis peristalsing. Since that was found, dissection was then continued from distal to proximal with resultant IP ligament being serially clamped, cauterized, and cut with then removal of the left tube and ovary.  The ruptured cystic fluid throughout the case was being suctioned.  Once that ovary was  detached, the arm #2 was undocked and the Endobag was then placed. The specimen was placed in the bag.  Pelvis was copiously irrigated and suctioned.  The robot was subsequently undocked further and the specimen in the bag was brought up to the umbilicus.  At the umbilicus, using the S retractors, the specimen was then easily removed.  Once that was removed, the pelvis was again inspected, irrigated copiously, suctioned until fluid was clear.  The sites were inspected.  Good hemostasis was noted.  Bupivacaine was then instilled in the pelvis.  The robotic port sites were then removed and lastly, the AirSeal was removed.  The infraumbilical site, the pursestring was closed, taking care not to bring up any underlying structure and then the skin incision sites were then closed with 4-0 Vicryl subcuticular sutures.  SPECIMENS:  Endometrial curetting with submucosal fibroid, left tube and ovary and the right fallopian tube, pelvic washings all sent  ESTIMATED BLOOD LOSS:  Less than 25 mL.  INTRAOPERATIVE FLUID:  About  a liter.  URINE OUTPUT:  About 200, clear yellow urine.  COUNTS:  Sponge and instrument counts x2 were correct.  COMPLICATIONS:  None.  The patient tolerated the procedure well and was transferred to recovery in stable condition.     Servando Salina, M.D.     [place]/MEDQ  D:  06/21/2015  T:  06/22/2015  Job:  [id number]

## 2015-06-23 MED FILL — Heparin Sodium (Porcine) Inj 5000 Unit/ML: INTRAMUSCULAR | Qty: 1 | Status: AC

## 2015-06-25 ENCOUNTER — Encounter (HOSPITAL_COMMUNITY): Payer: Self-pay | Admitting: Obstetrics and Gynecology

## 2015-07-02 ENCOUNTER — Encounter (HOSPITAL_COMMUNITY): Payer: Self-pay | Admitting: Obstetrics and Gynecology

## 2015-07-02 NOTE — Anesthesia Postprocedure Evaluation (Signed)
  Anesthesia Post-op Note  Patient: Leah Robinson  Procedure(s) Performed: Procedure(s) with comments: ROBOTIC ASSISTED LEFT SALPINGO OOPHORECTOMY AND RIGHT SALPINGECTOMY With  Pelvic Washings (Left) - 2 1/2 hrs. DILATATION & CURETTAGE/HYSTEROSCOPY WITH RESECTOCOPE (N/A) Patient is awake and responsive. Pain and nausea are reasonably well controlled. Vital signs are stable and clinically acceptable. Oxygen saturation is clinically acceptable. There are no apparent anesthetic complications at this time. Patient is ready for discharge.

## 2015-07-13 ENCOUNTER — Encounter (HOSPITAL_COMMUNITY): Payer: Self-pay | Admitting: Obstetrics and Gynecology

## 2015-07-13 NOTE — Addendum Note (Signed)
Addendum  created 07/13/15 0021 by Lyndle Herrlich, MD   Modules edited: Anesthesia Events

## 2016-01-14 ENCOUNTER — Other Ambulatory Visit: Payer: Self-pay

## 2016-01-14 DIAGNOSIS — Z1231 Encounter for screening mammogram for malignant neoplasm of breast: Secondary | ICD-10-CM

## 2016-02-18 ENCOUNTER — Ambulatory Visit
Admission: RE | Admit: 2016-02-18 | Discharge: 2016-02-18 | Disposition: A | Discharge status: Home / Self Care | Payer: 59 | Source: Ambulatory Visit

## 2016-02-18 DIAGNOSIS — Z1231 Encounter for screening mammogram for malignant neoplasm of breast: Secondary | ICD-10-CM

## 2016-04-03 DIAGNOSIS — E785 Hyperlipidemia, unspecified: Secondary | ICD-10-CM | POA: Diagnosis not present

## 2016-04-03 DIAGNOSIS — E559 Vitamin D deficiency, unspecified: Secondary | ICD-10-CM | POA: Diagnosis not present

## 2016-04-03 DIAGNOSIS — E1165 Type 2 diabetes mellitus with hyperglycemia: Secondary | ICD-10-CM | POA: Diagnosis not present

## 2016-04-03 DIAGNOSIS — Z0000 Encounter for general adult medical examination without abnormal findings: Secondary | ICD-10-CM | POA: Diagnosis not present

## 2016-04-03 DIAGNOSIS — Z79899 Other long term (current) drug therapy: Secondary | ICD-10-CM | POA: Diagnosis not present

## 2016-04-07 DIAGNOSIS — E1165 Type 2 diabetes mellitus with hyperglycemia: Secondary | ICD-10-CM | POA: Diagnosis not present

## 2016-04-07 DIAGNOSIS — E785 Hyperlipidemia, unspecified: Secondary | ICD-10-CM | POA: Diagnosis not present

## 2016-04-07 DIAGNOSIS — Z0000 Encounter for general adult medical examination without abnormal findings: Secondary | ICD-10-CM | POA: Diagnosis not present

## 2016-09-15 DIAGNOSIS — E785 Hyperlipidemia, unspecified: Secondary | ICD-10-CM | POA: Diagnosis not present

## 2016-09-15 DIAGNOSIS — E1165 Type 2 diabetes mellitus with hyperglycemia: Secondary | ICD-10-CM | POA: Diagnosis not present

## 2016-09-15 DIAGNOSIS — Z79899 Other long term (current) drug therapy: Secondary | ICD-10-CM | POA: Diagnosis not present

## 2016-12-10 DIAGNOSIS — E1165 Type 2 diabetes mellitus with hyperglycemia: Secondary | ICD-10-CM | POA: Diagnosis not present

## 2016-12-10 DIAGNOSIS — E785 Hyperlipidemia, unspecified: Secondary | ICD-10-CM | POA: Diagnosis not present

## 2017-01-15 ENCOUNTER — Ambulatory Visit: Payer: 59 | Admitting: Internal Medicine

## 2017-01-26 ENCOUNTER — Other Ambulatory Visit: Payer: Self-pay | Admitting: Internal Medicine

## 2017-01-26 DIAGNOSIS — Z1231 Encounter for screening mammogram for malignant neoplasm of breast: Secondary | ICD-10-CM

## 2017-02-20 ENCOUNTER — Ambulatory Visit
Admission: RE | Admit: 2017-02-20 | Discharge: 2017-02-20 | Disposition: A | Discharge status: Home / Self Care | Payer: Medicare PPO | Source: Ambulatory Visit | Attending: Internal Medicine | Admitting: Internal Medicine

## 2017-02-20 DIAGNOSIS — Z1231 Encounter for screening mammogram for malignant neoplasm of breast: Secondary | ICD-10-CM | POA: Diagnosis not present

## 2017-02-27 ENCOUNTER — Other Ambulatory Visit: Payer: Self-pay

## 2017-02-27 ENCOUNTER — Encounter: Payer: Self-pay | Admitting: Internal Medicine

## 2017-02-27 ENCOUNTER — Ambulatory Visit (INDEPENDENT_AMBULATORY_CARE_PROVIDER_SITE_OTHER): Payer: Medicare PPO | Admitting: Internal Medicine

## 2017-02-27 VITALS — BP 122/82 | HR 70 | Ht 65.0 in | Wt 214.0 lb

## 2017-02-27 DIAGNOSIS — E119 Type 2 diabetes mellitus without complications: Secondary | ICD-10-CM | POA: Insufficient documentation

## 2017-02-27 DIAGNOSIS — E1165 Type 2 diabetes mellitus with hyperglycemia: Secondary | ICD-10-CM | POA: Diagnosis not present

## 2017-02-27 LAB — POCT GLYCOSYLATED HEMOGLOBIN (HGB A1C): Hemoglobin A1C: 6.9

## 2017-02-27 MED ORDER — ACCU-CHEK FASTCLIX LANCETS MISC: 3 refills | Status: DC

## 2017-02-27 MED ORDER — GLUCOSE BLOOD VI STRP: ORAL_STRIP | 5 refills | Status: DC

## 2017-02-27 MED ORDER — ACCU-CHEK GUIDE W/DEVICE KIT: 1.0000 | PACK | Freq: Every day | 0 refills | Status: DC

## 2017-02-27 MED ORDER — ONETOUCH DELICA LANCETS FINE MISC: 5 refills | Status: DC

## 2017-02-27 NOTE — Patient Instructions (Signed)
Please stay off Metformin.  Please let me know if the sugars are consistently <80 or >200.c  Start checking sugars 1x a day, rotating check times.  Let me know about the test strips.  Please return in 3 months with your sugar log.   PATIENT INSTRUCTIONS FOR TYPE 2 DIABETES:  **Please join MyChart!** - see attached instructions about how to join if you have not done so already.  DIET AND EXERCISE Diet and exercise is an important part of diabetic treatment.  We recommended aerobic exercise in the form of brisk walking (working between 40-60% of maximal aerobic capacity, similar to brisk walking) for 150 minutes per week (such as 30 minutes five days per week) along with 3 times per week performing 'resistance' training (using various gauge rubber tubes with handles) 5-10 exercises involving the major muscle groups (upper body, lower body and core) performing 10-15 repetitions (or near fatigue) each exercise. Start at half the above goal but build slowly to reach the above goals. If limited by weight, joint pain, or disability, we recommend daily walking in a swimming pool with water up to waist to reduce pressure from joints while allow for adequate exercise.    BLOOD GLUCOSES Monitoring your blood glucoses is important for continued management of your diabetes. Please check your blood glucoses 2-4 times a day: fasting, before meals and at bedtime (you can rotate these measurements - e.g. one day check before the 3 meals, the next day check before 2 of the meals and before bedtime, etc.).   HYPOGLYCEMIA (low blood sugar) Hypoglycemia is usually a reaction to not eating, exercising, or taking too much insulin/ other diabetes drugs.  Symptoms include tremors, sweating, hunger, confusion, headache, etc. Treat IMMEDIATELY with 15 grams of Carbs: . 4 glucose tablets .  cup regular juice/soda . 2 tablespoons raisins . 4 teaspoons sugar . 1 tablespoon honey Recheck blood glucose in 15 mins and  repeat above if still symptomatic/blood glucose <100.  RECOMMENDATIONS TO REDUCE YOUR RISK OF DIABETIC COMPLICATIONS: * Take your prescribed MEDICATION(S) * Follow a DIABETIC diet: Complex carbs, fiber rich foods, (monounsaturated and polyunsaturated) fats * AVOID saturated/trans fats, high fat foods, >2,300 mg salt per day. * EXERCISE at least 5 times a week for 30 minutes or preferably daily.  * DO NOT SMOKE OR DRINK more than 1 drink a day. * Check your FEET every day. Do not wear tightfitting shoes. Contact us if you develop an ulcer * See your EYE doctor once a year or more if needed * Get a FLU shot once a year * Get a PNEUMONIA vaccine once before and once after age 107 years  GOALS:  * Your Hemoglobin A1c of <7%  * fasting sugars need to be <130 * after meals sugars need to be <180 (2h after you start eating) * Your Systolic BP should be 233 or lower  * Your Diastolic BP should be 80 or lower  * Your HDL (Good Cholesterol) should be 40 or higher  * Your LDL (Bad Cholesterol) should be 100 or lower. * Your Triglycerides should be 150 or lower  * Your Urine microalbumin (kidney function) should be <30 * Your Body Mass Index should be 25 or lower    Please consider the following ways to cut down carbs and fat and increase fiber and micronutrients in your diet: - substitute whole grain for white bread or pasta - substitute brown rice for white rice - substitute 90-calorie flat bread pieces for slices  of bread when possible - substitute sweet potatoes or yams for white potatoes - substitute humus for margarine - substitute tofu for cheese when possible - substitute almond or rice milk for regular milk (would not drink soy milk daily due to concern for soy estrogen influence on breast cancer risk) - substitute dark chocolate for other sweets when possible - substitute water - can add lemon or orange slices for taste - for diet sodas (artificial sweeteners will trick your body that  you can eat sweets without getting calories and will lead you to overeating and weight gain in the long run) - do not skip breakfast or other meals (this will slow down the metabolism and will result in more weight gain over time)  - can try smoothies made from fruit and almond/rice milk in am instead of regular breakfast - can also try old-fashioned (not instant) oatmeal made with almond/rice milk in am - order the dressing on the side when eating salad at a restaurant (pour less than half of the dressing on the salad) - eat as little meat as possible - can try juicing, but should not forget that juicing will get rid of the fiber, so would alternate with eating raw veg./fruits or drinking smoothies - use as little oil as possible, even when using olive oil - can dress a salad with a mix of balsamic vinegar and lemon juice, for e.g. - use agave nectar, stevia sugar, or regular sugar rather than artificial sweateners - steam or broil/roast veggies  - snack on veggies/fruit/nuts (unsalted, preferably) when possible, rather than processed foods - reduce or eliminate aspartame in diet (it is in diet sodas, chewing gum, etc) Read the labels!  Try to read Dr. Janene Harvey book: "Program for Reversing Diabetes" for other ideas for healthy eating.  Also, "[employer]" by the same author.

## 2017-02-27 NOTE — Telephone Encounter (Signed)
Called and LVM for patient regarding the meter, strips, and lancets. Left call back number for patient to call back if any questions.

## 2017-02-27 NOTE — Progress Notes (Signed)
Patient ID: Leah W Hice, female   DOB: [birth year], 66 y.o.   MRN: [id number]   HPI: Leah Robinson is a 66 y.o.-year-old female, referred by her Damita Lack FNP (Dr. Glendale Chard), for management of DM2, dx as prediabetes in 2016, then DM2 in , non-insulin-dependent, uncontrolled, without long term complications.  Last hemoglobin A1c was: Reportedly, in the 6-7% range. No results found for: HGBA1C  Pt is not on any meds for DM. She had to stop Metformin (started 2016) b/c diarrhea. She did not try Metformin ER, although this was recommended by PCP. She tells me she could not get this from the pharmacy.  Pt does not check sugars. - am: n/c - 2h after b'fast: n/c - before lunch: n/c - 2h after lunch: n/c - before dinner: n/c - 2h after dinner: n/c - bedtime: n/c - nighttime: n/c  Glucometer: ? (will let me know)  Pt's meals are: - Breakfast: toast, bacon, cereal, sandwich, ham bisqit, boiled egg - Lunch: salad + nuts, [place] sandwich, tuna - Dinner: greens or veggies, chicken/pork/fish - Snacks: 3 (mostly at night)  She goes to the [facility] 5x a week: 2-3h a day: Silver Sneakers, Zumba, kick boxing, swimming.  - no CKD, last BUN/creatinine:  12/10/2016: 10/0.80, eGFR 89, Glu 126 Lab Results  Component Value Date   BUN 17 06/15/2015   CREATININE 0.78 06/15/2015   - She has HL. Latets Lipid panel: 12/10/2016: 186/83/51/118 No results found for: CHOL, HDL, LDLCALC, LDLDIRECT, TRIG, CHOLHDL  On Lipitor. - last eye exam was in 05/2015. No DR.  - no numbness and tingling in her feet.  Pt has FH of DM in mother, maternal uncle.  ROS: Constitutional: no weight gain/loss, no fatigue, no subjective hyperthermia/hypothermia Eyes: no blurry vision, no xerophthalmia ENT: no sore throat, no nodules palpated in throat, no dysphagia/odynophagia, no hoarseness Cardiovascular: no CP/SOB/palpitations/leg swelling Respiratory: + cough/ no SOB Gastrointestinal: no  N/V/D/C Musculoskeletal: no muscle/joint aches Skin: no rashes Neurological: no tremors/numbness/tingling/dizziness Psychiatric: no depression/anxiety  No past medical history on file. Past Surgical History:  Procedure Laterality Date  . DILATATION & CURRETTAGE/HYSTEROSCOPY WITH RESECTOCOPE N/A 06/21/2015   Procedure: DILATATION & CURETTAGE/HYSTEROSCOPY WITH RESECTOCOPE;  Surgeon: Servando Salina, MD;  Location: [facility];  Service: Gynecology;  Laterality: N/A;  . ROBOTIC ASSISTED SALPINGO OOPHERECTOMY Left 06/21/2015   Procedure: ROBOTIC ASSISTED LEFT SALPINGO OOPHORECTOMY AND RIGHT SALPINGECTOMY With  Pelvic Washings;  Surgeon: Servando Salina, MD;  Location: [facility];  Service: Gynecology;  Laterality: Left;  2 1/2 hrs.  . TUBAL LIGATION     Social History   Social History  . Marital status: Married    Spouse name: N/A  . Number of children: 1   Occupational History  . Retired from [employer]   Social History Main Topics  . Smoking status: Former Research scientist (life sciences), quit in 1995  . Smokeless tobacco: Never Used  . Alcohol use No  . Drug use: No   Current Outpatient Prescriptions on File Prior to Visit  Medication Sig Dispense Refill  . cholecalciferol (VITAMIN D) 1000 UNITS tablet Take 1,000 Units by mouth daily.    . Multiple Vitamin (MULTIVITAMIN WITH MINERALS) TABS tablet Take 1 tablet by mouth daily.     No current facility-administered medications on file prior to visit.    No Known Allergies   No family history on file.  PE: BP 122/82 (BP Location: Left Arm, Patient Position: Sitting)   Pulse 70   Ht '5\' 5"'$  (1.651 m)  Wt 214 lb (97.1 kg)   SpO2 97%   BMI 35.61 kg/m  Wt Readings from Last 3 Encounters:  02/27/17 214 lb (97.1 kg)  06/15/15 217 lb 4 oz (98.5 kg)   Constitutional: overweight, in NAD Eyes: PERRLA, EOMI, no exophthalmos ENT: moist mucous membranes, no thyromegaly, no cervical lymphadenopathy Cardiovascular: RRR, No MRG Respiratory: CTA B Gastrointestinal:  abdomen soft, NT, ND, BS+ Musculoskeletal: no deformities, strength intact in all 4 Skin: moist, warm, no rashes Neurological: no tremor with outstretched hands, DTR normal in all 4  ASSESSMENT: 1. DM2, non-insulin-dependent (diet-controlled), without long term complications, but with hyperglycemia  PLAN:  1. Patient with relatively recent, diet-controlled diabetes, prev. On Metformin, however, which she could not tolerate 2/2 abd. Cramps and diarrhea. She would like to continue to control her DM with diet and exercise, if possible. HbA1c today 6.9%.  - she is doing a wonderful job with exercise and is also improving her diet. We discussed about the benefits of reducing fat in her diet to reduce her insulin resistance. She is interested in doing this (eliminating bacon, cheese, eggs,  and most meats). I recommended materials for the plant-based diet. No other intervention is needed for now. - I suggested to:  Patient Instructions  Please stay off Metformin.  Please let me know if the sugars are consistently <80 or >200.c  Start checking sugars 1x a day, rotating check times.  Let me know about the test strips.  Please return in 3 months with your sugar log.  - Strongly advised her to start checking sugars at different times of the day - check once a day, rotating checks >> she will let us know what meter she has at home so we can send lancets and strips to her pharmacy - given sugar log and advised how to fill it and to bring it at next appt  - given foot care handout and explained the principles  - given instructions for hypoglycemia management "15-15 rule"  - advised for yearly eye exams >> she needs one - Return to clinic in 3 mo with sugar log   [name], MD PhD [facility]

## 2017-02-27 NOTE — Telephone Encounter (Signed)
Patient called and stated the meter she has was the one touch verio flex, I submitted the test strips and lancets to her pharmacy. Patient wanted you to be aware.

## 2017-05-25 ENCOUNTER — Ambulatory Visit: Payer: Medicare PPO | Admitting: Internal Medicine

## 2017-06-26 DIAGNOSIS — R202 Paresthesia of skin: Secondary | ICD-10-CM | POA: Diagnosis not present

## 2017-06-26 DIAGNOSIS — H9190 Unspecified hearing loss, unspecified ear: Secondary | ICD-10-CM | POA: Diagnosis not present

## 2017-06-26 DIAGNOSIS — E1165 Type 2 diabetes mellitus with hyperglycemia: Secondary | ICD-10-CM | POA: Diagnosis not present

## 2017-06-26 DIAGNOSIS — Z0000 Encounter for general adult medical examination without abnormal findings: Secondary | ICD-10-CM | POA: Diagnosis not present

## 2017-06-26 DIAGNOSIS — E785 Hyperlipidemia, unspecified: Secondary | ICD-10-CM | POA: Diagnosis not present

## 2017-08-17 ENCOUNTER — Encounter: Payer: Self-pay | Admitting: Internal Medicine

## 2017-08-17 ENCOUNTER — Ambulatory Visit (INDEPENDENT_AMBULATORY_CARE_PROVIDER_SITE_OTHER): Payer: Medicare PPO | Admitting: Internal Medicine

## 2017-08-17 VITALS — BP 130/82 | HR 60 | Ht 65.0 in | Wt 216.0 lb

## 2017-08-17 DIAGNOSIS — E785 Hyperlipidemia, unspecified: Secondary | ICD-10-CM

## 2017-08-17 DIAGNOSIS — E119 Type 2 diabetes mellitus without complications: Secondary | ICD-10-CM

## 2017-08-17 LAB — POCT GLYCOSYLATED HEMOGLOBIN (HGB A1C): Hemoglobin A1C: 7.7

## 2017-08-17 NOTE — Progress Notes (Addendum)
Patient ID: Leah Robinson, female   DOB: [birth year], 66 y.o.   MRN: [id number]   HPI: Leah Robinson is a 66 y.o.-year-old female, initially referred by her Ko Vaya FNP (Dr. Glendale Chard - [facility] returning for f/u for DM2, dx as prediabetes in 2016, then DM2 in , non-insulin-dependent, uncontrolled, without long term complications. Last visit 5.5 mo ago.  She had increased stress this summer: relatives were sick, a friend died unexpectedly. She was eating at night more.  Last hemoglobin A1c was: Lab Results  Component Value Date   HGBA1C 6.9 02/27/2017  Reportedly, prev. in the 6-7% range.  Pt is not on any meds for DM. She had to stop Metformin (started 2016) b/c diarrhea. She did not try Metformin ER, although this was recommended by PCP. She could not get this from the pharmacy (?).  Pt checks sugars 0-1x a day: - am: n/c >> 148 (not usually checking as she goes to the gym at 5 am) - 2h after b'fast: n/c >> 129, 150 - before lunch: n/c >> 119-136 - 2h after lunch: n/c >> 126, 212 - before dinner: n/c >> 105-120, 173 - 2h after dinner: n/c >> 90-127 - bedtime: n/c >> 134, 149 - nighttime: n/c  Glucometer: AccuChek Guide  Pt's meals are: - Breakfast: toast, bacon, cereal, sandwich, ham bisqit, boiled egg - Lunch: salad + nuts, [place] sandwich, tuna - Dinner: greens or veggies, chicken/pork/fish - Snacks: 3 (mostly at night)  She continues to go to the [facility] 5x a week, 2-3 hrs a day: Silver Sneakers, Zumba, kick boxing, swimming.  - No CKD, last BUN/creatinine:  12/10/2016: 10/0.80, eGFR 89, Glu 126 Lab Results  Component Value Date   BUN 17 06/15/2015   CREATININE 0.78 06/15/2015   - + HL. Latest lipids: 12/10/2016: 186/83/51/118 No results found for: CHOL, HDL, LDLCALC, LDLDIRECT, TRIG, CHOLHDL  Was supposed to be on Lipitor. - last eye exam was in 05/2015 >> No DR. - she denies numbness and tingling in her feet.  ROS: Constitutional: no  weight gain/no weight loss, no fatigue, no subjective hyperthermia, no subjective hypothermia Eyes: no blurry vision, no xerophthalmia ENT: no sore throat, no nodules palpated in throat, no dysphagia, no odynophagia, no hoarseness Cardiovascular: no CP/no SOB/no palpitations/no leg swelling Respiratory: no cough/no SOB/no wheezing Gastrointestinal: no N/no V/no D/no C/no acid reflux Musculoskeletal: no muscle aches/no joint aches Skin: no rashes, no hair loss Neurological: no tremors/no numbness/no tingling/no dizziness  I reviewed pt's medications, allergies, PMH, social hx, family hx, and changes were documented in the history of present illness. Otherwise, unchanged from my initial visit note.  No past medical history on file. Past Surgical History:  Procedure Laterality Date  . DILATATION & CURRETTAGE/HYSTEROSCOPY WITH RESECTOCOPE N/A 06/21/2015   Procedure: DILATATION & CURETTAGE/HYSTEROSCOPY WITH RESECTOCOPE;  Surgeon: Servando Salina, MD;  Location: [facility];  Service: Gynecology;  Laterality: N/A;  . ROBOTIC ASSISTED SALPINGO OOPHERECTOMY Left 06/21/2015   Procedure: ROBOTIC ASSISTED LEFT SALPINGO OOPHORECTOMY AND RIGHT SALPINGECTOMY With  Pelvic Washings;  Surgeon: Servando Salina, MD;  Location: [facility];  Service: Gynecology;  Laterality: Left;  2 1/2 hrs.  . TUBAL LIGATION     Social History   Social History  . Marital status: Married    Spouse name: N/A  . Number of children: 1   Occupational History  . Retired from [employer]   Social History Main Topics  . Smoking status: Former Research scientist (life sciences), quit in 1995  . Smokeless tobacco:  Never Used  . Alcohol use No  . Drug use: No   Current Outpatient Prescriptions on File Prior to Visit  Medication Sig Dispense Refill  . ACCU-CHEK FASTCLIX LANCETS MISC Use to check sugar once daily 100 each 3  . Blood Glucose Monitoring Suppl (ACCU-CHEK GUIDE) w/Device KIT 1 Device by Does not apply route daily. 1 kit 0  . cholecalciferol (VITAMIN  D) 1000 UNITS tablet Take 1,000 Units by mouth daily.    Marland Kitchen glucose blood (ACCU-CHEK GUIDE) test strip Use as instructed to check sugar once daily 100 each 5  . Multiple Vitamin (MULTIVITAMIN WITH MINERALS) TABS tablet Take 1 tablet by mouth daily.     No current facility-administered medications on file prior to visit.    No Known Allergies   Pertinent FH: Pt has FH of DM in mother, maternal uncle.  PE: BP 130/82 (BP Location: Left Arm, Patient Position: Sitting)   Pulse 60   Ht _0  (1.651 m)   Wt 216 lb (98 kg)   SpO2 95%   BMI 35.94 kg/m   Wt Readings from Last 3 Encounters:  08/17/17 216 lb (98 kg)  02/27/17 214 lb (97.1 kg)  06/15/15 217 lb 4 oz (98.5 kg)   Constitutional: overweight, in NAD Eyes: PERRLA, EOMI, no exophthalmos ENT: moist mucous membranes, no thyromegaly, no cervical lymphadenopathy Cardiovascular: RRR, No MRG Respiratory: CTA B Gastrointestinal: abdomen soft, NT, ND, BS+ Musculoskeletal: no deformities, strength intact in all 4 Skin: moist, warm, no rashes Neurological: no tremor with outstretched hands, DTR normal in all 4  ASSESSMENT: 1. DM2, non-insulin-dependent (diet-controlled), without long term complications, but with hyperglycemia  2. HL  PLAN:  1. Patient with Relatively recent diagnosis of diabetes, previously on metformin, however, she could not tolerate this because of abdominal cramps and diarrhea. She was suggested to start metformin ER, but the prescription was apparently not ready at the pharmacy. At last visit, since her HbA1c was at goal, lower than 7, we continued off diabetes medicines per her preference. Since then, however, she relaxed her diet and, despite continuing exercise, she feels that her sugars are higher. Reviewing her log, her sugars are higher in the morning, but she did not have many checks in the last 2 months. Previously (150-190). Today, HbA1c is 7.7% (higher). - We discussed about diet and I recommended to cut  down eating at night. We also discussed about how best to eat fruit and the need to cut down on dietary fats. She would like to work on her diet for now and not start any medication for diabetes, but she agrees to come back in 3 months and to start metformin ER then, if still needed. - I suggested to:  Patient Instructions  You can continue without diabetes medications, but try to limit eating at night.  Please let me know if the sugars are consistently <80 or >200.  Check sugars 1x a day, rotating check times.  Please return in 3 months with your sugar log.  - continue checking sugars at different times of the day - check 1x a day, rotating checks - advised for yearly eye exams >> she needs one - Return to clinic in 3 mo with sugar log    2. HL - she had recent labs by PCP >> we are asking for records - she was apparently started on Lipitor but when she went to the pharmacy, her Rx was not there >> not on the med - I will let  her know about the labs when I receive them, but if they were not checked, will check them at next visit and may start the statin then.  Received labs from PCP, performed on 06/30/2017:  HbA1c 7.8% TSH 2.88 Lipids: 223/102/58/145 CMP normal, but with a glucose of 168, BUN/Cr 14/0.86 (GFR 81)  CBC with differential normal     [name], MD PhD [facility]

## 2017-08-17 NOTE — Addendum Note (Signed)
Addended by: Caprice Beaver T on: 08/17/2017 09:00 AM   Modules accepted: Orders

## 2017-08-17 NOTE — Patient Instructions (Addendum)
You can continue without diabetes medications, but try to limit eating at night.  Please let me know if the sugars are consistently <80 or >200.  Check sugars 1x a day, rotating check times.  Please return in 3 months with your sugar log.

## 2017-11-17 ENCOUNTER — Ambulatory Visit: Payer: Medicare PPO | Admitting: Internal Medicine

## 2018-01-18 ENCOUNTER — Other Ambulatory Visit: Payer: Self-pay | Admitting: Internal Medicine

## 2018-01-18 DIAGNOSIS — Z1231 Encounter for screening mammogram for malignant neoplasm of breast: Secondary | ICD-10-CM

## 2018-01-19 ENCOUNTER — Encounter: Payer: Self-pay | Admitting: Internal Medicine

## 2018-01-19 ENCOUNTER — Ambulatory Visit: Payer: Medicare PPO | Admitting: Internal Medicine

## 2018-01-19 VITALS — BP 142/80 | HR 78 | Ht 65.0 in | Wt 216.4 lb

## 2018-01-19 DIAGNOSIS — E119 Type 2 diabetes mellitus without complications: Secondary | ICD-10-CM

## 2018-01-19 DIAGNOSIS — E785 Hyperlipidemia, unspecified: Secondary | ICD-10-CM | POA: Diagnosis not present

## 2018-01-19 LAB — POCT GLYCOSYLATED HEMOGLOBIN (HGB A1C): Hemoglobin A1C: 8.1

## 2018-01-19 MED ORDER — GLUCOSE BLOOD VI STRP: ORAL_STRIP | 3 refills | Status: DC

## 2018-01-19 NOTE — Addendum Note (Signed)
Addended by: Drucilla Schmidt on: 01/19/2018 03:50 PM   Modules accepted: Orders

## 2018-01-19 NOTE — Patient Instructions (Addendum)
You can continue without diabetes medications for now.  Please stop at the lab.  Please let me know if the sugars are consistently <80 or >200.  Check sugars 1x a day, rotating check times.  Please return in 4 months with your sugar log.

## 2018-01-19 NOTE — Progress Notes (Signed)
Patient ID: Leah W Barasch, female   DOB: [birth year], 67 y.o.   MRN: [id number]   HPI: Leah Robinson is a 67 y.o.-year-old female, initially referred by her [place] FNP (Dr. Glendale Chard - [facility] returning for f/u for DM2, dx as prediabetes in 2016, then DM2 in , non-insulin-dependent, uncontrolled, without long term complications. Last visit 5 mo ago.  She is exercising: Silver sneakers and lifting weights.   Last hemoglobin A1c was: Lab Results  Component Value Date   HGBA1C 7.7 08/17/2017   HGBA1C 6.9 02/27/2017  Received labs from PCP, performed on 06/30/2017:  HbA1c 7.8% Reportedly, prev. in the 6-7% range.  Pt is not on any meds for DM. She had to stop Metformin (started 2016) because of diarrhea and abdominal pain. She did not try Metformin ER, although this was recommended by PCP. She could not get this from the pharmacy (?).  Pt checks sugars 0 to once a day: - am: n/c >> 148 (not usually checking as she goes to the gym at 5 am) >> 140 - 2h after b'fast: n/c >> 129, 150 >> 118-140 - before lunch: n/c >> 119-136 >> 110-123 - 2h after lunch: n/c >> 126, 212 >> 121-128 - before dinner: n/c >> 105-120, 173 >> 96-119 - 2h after dinner: n/c >> 90-127 >> 118-130, 150 - bedtime: n/c >> 134, 149 >> 120 - nighttime: n/c  Glucometer: AccuChek Guide  Pt's meals are: - Breakfast: toast, bacon, cereal, sandwich, ham bisqit, boiled egg - Lunch: salad + nuts, [place] sandwich, tuna - Dinner: greens or veggies, chicken/pork/fish - Snacks: 3 (mostly at night)  -+ Mild CKD, last BUN/creatinine:  Received labs from PCP, performed on 06/30/2017:  CMP normal, but with a glucose of 168, BUN/Cr 14/0.86 (GFR 81)  12/10/2016: 10/0.80, eGFR 89, Glu 126 Lab Results  Component Value Date   BUN 17 06/15/2015   CREATININE 0.78 06/15/2015   - + HL. Latest lipids: 06/30/2017: 223/102/58/145  12/10/2016: 186/83/51/118 No results found for: CHOL, HDL, LDLCALC, LDLDIRECT,  TRIG, CHOLHDL  Not on a statin. - last eye exam was in 10/2017 >>  No DR - + occasional numbness and tingling in her feet.  ROS: Constitutional: no weight gain/no weight loss, no fatigue, no subjective hyperthermia, no subjective hypothermia Eyes: no blurry vision, no xerophthalmia ENT: no sore throat, no nodules palpated in throat, no dysphagia, no odynophagia, no hoarseness Cardiovascular: no CP/no SOB/no palpitations/no leg swelling Respiratory: no cough/no SOB/no wheezing Gastrointestinal: no N/no V/no D/no C/no acid reflux Musculoskeletal: no muscle aches/no joint aches Skin: no rashes, no hair loss Neurological: no tremors/+ numbness/+ tingling/no dizziness  I reviewed pt's medications, allergies, PMH, social hx, family hx, and changes were documented in the history of present illness. Otherwise, unchanged from my initial visit note.  No past medical history on file. Past Surgical History:  Procedure Laterality Date  . DILATATION & CURRETTAGE/HYSTEROSCOPY WITH RESECTOCOPE N/A 06/21/2015   Procedure: DILATATION & CURETTAGE/HYSTEROSCOPY WITH RESECTOCOPE;  Surgeon: Servando Salina, MD;  Location: [facility];  Service: Gynecology;  Laterality: N/A;  . ROBOTIC ASSISTED SALPINGO OOPHERECTOMY Left 06/21/2015   Procedure: ROBOTIC ASSISTED LEFT SALPINGO OOPHORECTOMY AND RIGHT SALPINGECTOMY With  Pelvic Washings;  Surgeon: Servando Salina, MD;  Location: [facility];  Service: Gynecology;  Laterality: Left;  2 1/2 hrs.  . TUBAL LIGATION     Social History   Social History  . Marital status: Married    Spouse name: N/A  . Number of children: 1  Occupational History  . Retired from [employer]   Social History Main Topics  . Smoking status: Former Research scientist (life sciences), quit in 1995  . Smokeless tobacco: Never Used  . Alcohol use No  . Drug use: No   Current Outpatient Medications on File Prior to Visit  Medication Sig Dispense Refill  . ACCU-CHEK FASTCLIX LANCETS MISC Use to check sugar once daily 100  each 3  . Blood Glucose Monitoring Suppl (ACCU-CHEK GUIDE) w/Device KIT 1 Device by Does not apply route daily. 1 kit 0  . cholecalciferol (VITAMIN D) 1000 UNITS tablet Take 1,000 Units by mouth daily.    Marland Kitchen glucose blood (ACCU-CHEK GUIDE) test strip Use as instructed to check sugar once daily 100 each 5  . Multiple Vitamin (MULTIVITAMIN WITH MINERALS) TABS tablet Take 1 tablet by mouth daily.     No current facility-administered medications on file prior to visit.    No Known Allergies   Pertinent FH: Pt has FH of DM in mother, maternal uncle.  PE: BP (!) 142/80   Pulse 78   Ht 5' 5" (1.651 m)   Wt 216 lb 6.4 oz (98.2 kg)   SpO2 96%   BMI 36.01 kg/m  Wt Readings from Last 3 Encounters:  01/19/18 216 lb 6.4 oz (98.2 kg)  08/17/17 216 lb (98 kg)  02/27/17 214 lb (97.1 kg)   Constitutional: overweight, in NAD Eyes: PERRLA, EOMI, no exophthalmos ENT: moist mucous membranes, no thyromegaly, no cervical lymphadenopathy Cardiovascular: RRR, No MRG Respiratory: CTA B Gastrointestinal: abdomen soft, NT, ND, BS+ Musculoskeletal: no deformities, strength intact in all 4 Skin: moist, warm, no rashes Neurological: no tremor with outstretched hands, DTR normal in all 4  ASSESSMENT: 1. DM2, non-insulin-dependent (diet-controlled), without long term complications, but with hyperglycemia  2. HL  PLAN:  1. Patient with relatively recent diagnosis of diabetes, previously on metformin, which she could not tolerate because of abdominal cramps and diarrhea.  At last visit, her HbA1c was higher, at 7.7%, but this was after a period of dietary indiscretions so we discussed about resuming her previous diet but we did not attempt to start metformin ER at that time. - At this visit, her sugars are at or close to goal, still slightly higher in the morning, but she does not have many readings in the morning.  During the rest of the day, I do not see any spikes in the last 2 months, but she had some in  September. - today, HbA1c is 8.1% (higher) but does not correlate with the sugars in her log.  We will check a fructosamine. - I suggested to:  Patient Instructions  You can continue without diabetes medications for now.  Please let me know if the sugars are consistently <80 or >200.  Check sugars 1x a day, rotating check times.  Please return in 4 months with your sugar log.  - continue checking sugars at different times of the day - check 1x a day, rotating checks - advised for yearly eye exams >> she is UTD - Return to clinic in 4 mo with sugar log    2. HL - Reviewed her most recent lipid panel from 07/2017: LDL and triglycerides high - she is not on Lipitor... - She has another visit with PCP in 02/2018.  She may need to start a statin at that time.  We will review her lipids with her at next visit.   [name], MD PhD [facility]

## 2018-02-26 DIAGNOSIS — E119 Type 2 diabetes mellitus without complications: Secondary | ICD-10-CM | POA: Diagnosis not present

## 2018-02-26 DIAGNOSIS — E785 Hyperlipidemia, unspecified: Secondary | ICD-10-CM | POA: Diagnosis not present

## 2018-03-16 ENCOUNTER — Ambulatory Visit
Admission: RE | Admit: 2018-03-16 | Discharge: 2018-03-16 | Disposition: A | Discharge status: Home / Self Care | Payer: Medicare PPO | Source: Ambulatory Visit | Attending: Internal Medicine | Admitting: Internal Medicine

## 2018-03-16 DIAGNOSIS — Z1231 Encounter for screening mammogram for malignant neoplasm of breast: Secondary | ICD-10-CM

## 2018-03-30 DIAGNOSIS — E119 Type 2 diabetes mellitus without complications: Secondary | ICD-10-CM | POA: Diagnosis not present

## 2018-04-26 ENCOUNTER — Other Ambulatory Visit: Payer: Self-pay

## 2018-04-26 MED ORDER — ACCU-CHEK FASTCLIX LANCETS MISC: 3 refills | Status: DC

## 2018-05-18 ENCOUNTER — Ambulatory Visit: Payer: Medicare PPO | Admitting: Internal Medicine

## 2018-05-18 ENCOUNTER — Encounter: Payer: Self-pay | Admitting: Internal Medicine

## 2018-05-18 VITALS — BP 146/78 | HR 77 | Ht 65.0 in | Wt 213.0 lb

## 2018-05-18 DIAGNOSIS — E785 Hyperlipidemia, unspecified: Secondary | ICD-10-CM | POA: Diagnosis not present

## 2018-05-18 DIAGNOSIS — E119 Type 2 diabetes mellitus without complications: Secondary | ICD-10-CM | POA: Diagnosis not present

## 2018-05-18 LAB — POCT GLYCOSYLATED HEMOGLOBIN (HGB A1C): Hemoglobin A1C: 7.9 % — AB (ref 4.0–5.6)

## 2018-05-18 NOTE — Patient Instructions (Addendum)
You can continue with all diabetes medicines for now.  Please let me know if the sugars are consistently lower than 80 or higher than 180s.  Check sugars once a day, rotating check times.  Stop eating after dinner or earlier during the weekend.  Please stop at the lab.  Please return in 4 months with your sugar log.

## 2018-05-18 NOTE — Progress Notes (Signed)
Patient ID: Leah Robinson, female   DOB: 21-Oct-1951, 67 y.o.   MRN: [id number]   HPI: Leah Robinson is a 67 y.o.-year-old female, initially referred by her [place] FNP (Dr. Glendale Chard - [facility] returning for f/u for DM2, dx as prediabetes in 2016, then DM2 in , non-insulin-dependent, uncontrolled, without long term complications. Last visit 4 months ago.  Last hemoglobin A1c was: Lab Results  Component Value Date   HGBA1C 8.1 01/19/2018   HGBA1C 7.7 08/17/2017   HGBA1C 6.9 02/27/2017  Received labs from PCP, performed on 06/30/2017:  HbA1c 7.8% Reportedly, prev. in the 6-7% range.  Patient is not on any meds for diabetes. She had to stop Metformin (started 2016) because of diarrhea and abdominal pain. She did not try Metformin ER, although this was recommended by PCP. She could not get this from the pharmacy (?).  Pt checks sugars 1x a day: - am: n/c >> 148  >> 140 >> 140-160 (snacks at bedtime!) - 2h after b'fast: 129, 150 >> 118-140 >> 128-140 - before lunch:  119-136 >> 110-123 >> 130-140 - 2h after lunch:126, 212 >> 121-128 >> 129-141 - before dinner: 105-120, 173 >> 96-119 >> 131-143 - 2h after dinner: 90-127 >> 118-130, 150 >> 89, 104-140 - bedtime: n/c >> 134, 149 >> 120 >> n/c - nighttime: n/c  Glucometer: Accu-Chek guide >> needs new one as hers is broken (given today)  Pt's meals are: - Breakfast: toast, bacon, cereal, sandwich, ham bisqit, boiled egg - Lunch: salad + nuts, [place] sandwich, tuna - Dinner: greens or veggies, chicken/pork/fish - Snacks: 3 (mostly at night)  - + mild CKD, last BUN/creatinine:  06/30/2017: CMP normal, but with a glucose of 168, BUN/Cr 14/0.86 (GFR 81)  12/10/2016: 10/0.80, eGFR 89, Glu 126 Lab Results  Component Value Date   BUN 17 06/15/2015   CREATININE 0.78 06/15/2015   - + HL. Latest lipids: 06/30/2017: 223/102/58/145  12/10/2016: 186/83/51/118 No results found for: CHOL, HDL, LDLCALC, LDLDIRECT,  TRIG, CHOLHDL  Not on a statin.  She had lipids checked by PCP in 02/2018 >> need records.  - last eye exam was in 03/2018: No DR, low stage cataracts  - + Occasional numbness and tingling in her feet.  ROS: Constitutional: no weight gain/no weight loss, no fatigue, no subjective hyperthermia, no subjective hypothermia Eyes: no blurry vision, no xerophthalmia ENT: no sore throat, no nodules palpated in throat, no dysphagia, no odynophagia, no hoarseness Cardiovascular: no CP/no SOB/no palpitations/no leg swelling Respiratory: + cough/no SOB/no wheezing Gastrointestinal: no N/no V/no D/no C/no acid reflux Musculoskeletal: no muscle aches/no joint aches Skin: no rashes, no hair loss Neurological: no tremors/+ numbness/+ tingling/no dizziness  I reviewed pt's medications, allergies, PMH, social hx, family hx, and changes were documented in the history of present illness. Otherwise, unchanged from my initial visit note.  History reviewed. No pertinent past medical history. Past Surgical History:  Procedure Laterality Date  . DILATATION & CURRETTAGE/HYSTEROSCOPY WITH RESECTOCOPE N/A 06/21/2015   Procedure: DILATATION & CURETTAGE/HYSTEROSCOPY WITH RESECTOCOPE;  Surgeon: Servando Salina, MD;  Location: [facility];  Service: Gynecology;  Laterality: N/A;  . ROBOTIC ASSISTED SALPINGO OOPHERECTOMY Left 06/21/2015   Procedure: ROBOTIC ASSISTED LEFT SALPINGO OOPHORECTOMY AND RIGHT SALPINGECTOMY With  Pelvic Washings;  Surgeon: Servando Salina, MD;  Location: [facility];  Service: Gynecology;  Laterality: Left;  2 1/2 hrs.  . TUBAL LIGATION     Social History   Social History  . Marital status: Married  Spouse name: N/A  . Number of children: 1   Occupational History  . Retired from [employer]   Social History Main Topics  . Smoking status: Former Research scientist (life sciences), quit in 1995  . Smokeless tobacco: Never Used  . Alcohol use No  . Drug use: No   Current Outpatient Medications on File Prior to Visit   Medication Sig Dispense Refill  . ACCU-CHEK FASTCLIX LANCETS MISC Use to check sugar once daily 102 each 3  . Blood Glucose Monitoring Suppl (ACCU-CHEK GUIDE) w/Device KIT 1 Device by Does not apply route daily. 1 kit 0  . cholecalciferol (VITAMIN D) 1000 UNITS tablet Take 1,000 Units by mouth daily.    Marland Kitchen glucose blood (ACCU-CHEK GUIDE) test strip Use as instructed to check sugar once daily 100 each 3  . Multiple Vitamin (MULTIVITAMIN WITH MINERALS) TABS tablet Take 1 tablet by mouth daily.     No current facility-administered medications on file prior to visit.    No Known Allergies   Pertinent FH: Pt has FH of DM in mother, maternal uncle.  PE: BP (!) 146/78 (BP Location: Left Arm, Patient Position: Sitting, Cuff Size: Normal)   Pulse 77   Ht '5\' 5"'$  (1.651 m)   Wt 213 lb (96.6 kg)   SpO2 95%   BMI 35.45 kg/m  Wt Readings from Last 3 Encounters:  05/18/18 213 lb (96.6 kg)  01/19/18 216 lb 6.4 oz (98.2 kg)  08/17/17 216 lb (98 kg)   Constitutional: overweight, in NAD Eyes: PERRLA, EOMI, no exophthalmos ENT: moist mucous membranes, no thyromegaly, no cervical lymphadenopathy Cardiovascular: RRR, No MRG Respiratory: CTA B Gastrointestinal: abdomen soft, NT, ND, BS+ Musculoskeletal: no deformities, strength intact in all 4 Skin: moist, warm, no rashes Neurological: no tremor with outstretched hands, DTR normal in all 4  ASSESSMENT: 1. DM2, non-insulin-dependent (diet-controlled), without long term complications, but with hyperglycemia  2. HL  PLAN:  1. Patient with relatively recent diagnosis of diabetes, previously on metformin, which she could not tolerate because of abdominal cramps and diarrhea.  Her HbA1c was higher at last visit, at 8.1%, but this did not correlate with the sugars in her log.  I advised her to stop at the lab for a fructosamine level at that time, but she forgot.  We continued her without medications at last visit.   - at this visit, sugars are mostly  at goal except am hyperglycemia, most likely 2/2 snacks at night >> discussed to stop eating after dinner and maybe during the weekends to skip dinner (intermittent fasting). She wants to try. - will not add medications now, but discussed improving diet (see above) - I suggested to:  Patient Instructions  You can continue with all diabetes medicines for now.  Please let me know if the sugars are consistently lower than 80 or higher than 180s.  Check sugars once a day, rotating check times.  Stop eating after dinner or earlier during the weekend.  Please stop at the lab.  Please return in 4 months with your sugar log.  - today, HbA1c is 7.9% (slightly better) - will also check a fructosamine level - continue checking sugars at different times of the day - check 1x a day, rotating checks - advised for yearly eye exams >> she is UTD - Return to clinic in 4 mo with sugar log    2. HL - Reviewed latest lipid panel from 07/2017 >> LDL and TG high - not on a statin - will try to  obtain latest labs from PCP  Component     Latest Ref Rng & Units 05/18/2018  Hemoglobin A1C     4.0 - 5.6 % 7.9 (A)  Fructosamine     190 - 270 umol/L 288 (H)   HbA1c calculated from the fructosamine is much better, at 6.5%.  [name], MD PhD [facility] Endocrinology

## 2018-05-20 LAB — FRUCTOSAMINE: FRUCTOSAMINE: 288 umol/L — AB (ref 190–270)

## 2018-08-25 DIAGNOSIS — Z124 Encounter for screening for malignant neoplasm of cervix: Secondary | ICD-10-CM | POA: Diagnosis not present

## 2018-08-25 DIAGNOSIS — E119 Type 2 diabetes mellitus without complications: Secondary | ICD-10-CM | POA: Diagnosis not present

## 2018-08-25 DIAGNOSIS — E785 Hyperlipidemia, unspecified: Secondary | ICD-10-CM | POA: Diagnosis not present

## 2018-08-25 DIAGNOSIS — Z0000 Encounter for general adult medical examination without abnormal findings: Secondary | ICD-10-CM | POA: Diagnosis not present

## 2018-08-25 DIAGNOSIS — Z13820 Encounter for screening for osteoporosis: Secondary | ICD-10-CM | POA: Diagnosis not present

## 2018-08-25 DIAGNOSIS — E2839 Other primary ovarian failure: Secondary | ICD-10-CM | POA: Diagnosis not present

## 2018-09-03 ENCOUNTER — Other Ambulatory Visit: Payer: Self-pay | Admitting: Nurse Practitioner

## 2018-09-03 DIAGNOSIS — E2839 Other primary ovarian failure: Secondary | ICD-10-CM

## 2018-09-22 ENCOUNTER — Encounter: Payer: Self-pay | Admitting: Internal Medicine

## 2018-09-22 ENCOUNTER — Ambulatory Visit: Payer: Medicare PPO | Admitting: Internal Medicine

## 2018-09-22 VITALS — BP 140/80 | HR 83 | Ht 65.0 in | Wt 209.0 lb

## 2018-09-22 DIAGNOSIS — E669 Obesity, unspecified: Secondary | ICD-10-CM | POA: Insufficient documentation

## 2018-09-22 DIAGNOSIS — E785 Hyperlipidemia, unspecified: Secondary | ICD-10-CM | POA: Diagnosis not present

## 2018-09-22 DIAGNOSIS — Z23 Encounter for immunization: Secondary | ICD-10-CM

## 2018-09-22 DIAGNOSIS — E119 Type 2 diabetes mellitus without complications: Secondary | ICD-10-CM | POA: Diagnosis not present

## 2018-09-22 LAB — POCT GLYCOSYLATED HEMOGLOBIN (HGB A1C): Hemoglobin A1C: 7.7 % — AB (ref 4.0–5.6)

## 2018-09-22 NOTE — Progress Notes (Addendum)
Patient ID: Leah W Cech, female   DOB: [birth year], 67 y.o.   MRN: [id number]   HPI: Leah Robinson is a 67 y.o.-year-old female, initially referred by her [place] FNP (Dr. Glendale Chard - [facility] returning for f/u for DM2, dx as prediabetes in 2016, then DM2 in , non-insulin-dependent, uncontrolled, without long term complications. Last visit 4 months ago.  Last hemoglobin A1c was: 05/18/2018:HbA1c calculated from the fructosamine is much better, at 6.5%. Lab Results  Component Value Date   HGBA1C 7.9 (A) 05/18/2018   HGBA1C 8.1 01/19/2018   HGBA1C 7.7 08/17/2017  Received labs from PCP, performed on 06/30/2017:  HbA1c 7.8% Reportedly, prev. in the 6-7% range.  Patient is not on any medicines for her diabetes. She had to stop Metformin (started 2016) because of diarrhea and abdominal pain. She did not try Metformin ER, although this was recommended by PCP. She could not get this from the pharmacy (?).  Pt checks sugars once a day: - am: 140 >> 140-160 (snacks at bedtime!) >> 136-150 - 2h after b'fast: 129, 150 >> 118-140 >> 128-140 >> 138, 142 - before lunch:  119-136 >> 110-123 >> 130-140 >> 134-141 - 2h after lunch:126, 212 >> 121-128 >> 129-141 >> n/c - before dinner:  96-119 >> 131-143 >> 112-124, 160, 195 - 2h after dinner: 118-130, 150 >> 89, 104-140 >> 118 - bedtime: n/c >> 134, 149 >> 120 >> n/c >> 126 - nighttime: n/c  Glucometer: Accu-Chek guide   Pt's meals are: - Breakfast: toast, bacon, cereal, sandwich, ham bisqit, boiled egg - Lunch: salad + nuts, [place] sandwich, tuna - Dinner: greens or veggies, chicken/pork/fish - Snacks: 3 (mostly at night)  -+ Mild CKD, last BUN/creatinine:  06/30/2017: CMP normal, but with a glucose of 168, BUN/Cr 14/0.86 (GFR 81)  12/10/2016: 10/0.80, eGFR 89, Glu 126 Lab Results  Component Value Date   BUN 17 06/15/2015   CREATININE 0.78 06/15/2015   -+ HL. Latest lipids: 06/30/2017: 223/102/58/145   12/10/2016: 186/83/51/118 No results found for: CHOL, HDL, LDLCALC, LDLDIRECT, TRIG, CHOLHDL  Not on a statin.  She had lipids checked by PCP in 02/2018 >> need records.  - last eye exam was in 03/2018: No DR, low stage cataracts  -+ Occasional numbness and tingling in her feet.  She goes to the gym: swimming, walking, weights, Silver sneakers class-  5x a week.  ROS: Constitutional: no weight gain/no weight loss, no fatigue, no subjective hyperthermia, no subjective hypothermia Eyes: no blurry vision, no xerophthalmia ENT: no sore throat, no nodules palpated in throat, no dysphagia, no odynophagia, no hoarseness Cardiovascular: no CP/no SOB/no palpitations/no leg swelling Respiratory: no cough/no SOB/no wheezing, + upper respiratory congestion Gastrointestinal: no N/no V/no D/no C/no acid reflux Musculoskeletal: no muscle aches/no joint aches Skin: no rashes, no hair loss Neurological: no tremors/+ numbness/+ tingling/no dizziness  I reviewed pt's medications, allergies, PMH, social hx, family hx, and changes were documented in the history of present illness. Otherwise, unchanged from my initial visit note.  No past medical history on file. Past Surgical History:  Procedure Laterality Date  . DILATATION & CURRETTAGE/HYSTEROSCOPY WITH RESECTOCOPE N/A 06/21/2015   Procedure: DILATATION & CURETTAGE/HYSTEROSCOPY WITH RESECTOCOPE;  Surgeon: Servando Salina, MD;  Location: [facility];  Service: Gynecology;  Laterality: N/A;  . ROBOTIC ASSISTED SALPINGO OOPHERECTOMY Left 06/21/2015   Procedure: ROBOTIC ASSISTED LEFT SALPINGO OOPHORECTOMY AND RIGHT SALPINGECTOMY With  Pelvic Washings;  Surgeon: Servando Salina, MD;  Location: [place] [facility];  Service: Gynecology;  Laterality: Left;  2 1/2 hrs.  . TUBAL LIGATION     Social History   Social History  . Marital status: Married    Spouse name: N/A  . Number of children: 1   Occupational History  . Retired from [employer]   Social History Main  Topics  . Smoking status: Former Research scientist (life sciences), quit in 1995  . Smokeless tobacco: Never Used  . Alcohol use No  . Drug use: No   Current Outpatient Medications on File Prior to Visit  Medication Sig Dispense Refill  . ACCU-CHEK FASTCLIX LANCETS MISC Use to check sugar once daily 102 each 3  . Blood Glucose Monitoring Suppl (ACCU-CHEK GUIDE) w/Device KIT 1 Device by Does not apply route daily. 1 kit 0  . cholecalciferol (VITAMIN D) 1000 UNITS tablet Take 1,000 Units by mouth daily.    Marland Kitchen glucose blood (ACCU-CHEK GUIDE) test strip Use as instructed to check sugar once daily 100 each 3  . Multiple Vitamin (MULTIVITAMIN WITH MINERALS) TABS tablet Take 1 tablet by mouth daily.     No current facility-administered medications on file prior to visit.    No Known Allergies   Pertinent FH: Pt has FH of DM in mother, maternal uncle.  PE: BP 140/80   Pulse 83   Ht 5' 5" (1.651 m)   Wt 209 lb (94.8 kg)   SpO2 97%   BMI 34.78 kg/m  Wt Readings from Last 3 Encounters:  09/22/18 209 lb (94.8 kg)  05/18/18 213 lb (96.6 kg)  01/19/18 216 lb 6.4 oz (98.2 kg)   Constitutional: overweight, in NAD Eyes: PERRLA, EOMI, no exophthalmos ENT: moist mucous membranes, no thyromegaly, no cervical lymphadenopathy Cardiovascular: RRR, No MRG Respiratory: CTA B Gastrointestinal: abdomen soft, NT, ND, BS+ Musculoskeletal: no deformities, strength intact in all 4 Skin: moist, warm, no rashes Neurological: no tremor with outstretched hands, DTR normal in all 4  ASSESSMENT: 1. DM2, non-insulin-dependent (diet-controlled), without long term complications, but with hyperglycemia  2. HL  3.  Obesity  PLAN:  1. Patient with relatively well-controlled diabetes, previously on metformin, which she could not tolerate because of abdominal cramps and diarrhea.  Her sugar levels at home do not usually correlate with the HbA1c.  At last visit, a measured HbA1c was 7.9%, however, calculated from fructosamine, this was  6.5%, much better correlating with sugars at home.  We continued her off medications at last visit. Time, her sugars were mostly at goal, except a.m. hyperglycemia, most likely due to snacks at night.  We discussed about stopping eating after dinner or even earlier during the weekends. -At this visit, sugars are slightly better, but still the highest in the morning.  We discussed about possible reasons for this to include increased liver gluconeogenesis overnight, dawn phenomenon, hyperglycemia after dinner (however, this latter process does not appear to be going on for her based on review of her log).  As of now, we do not absolutely need to start medication, since sugars do appear to be controlled. - I suggested to:  Patient Instructions  You can continue to stay off diabetes medicines for now..  Please let me know if the sugars are consistently lower than 80 or higher than 180.  Check sugars once a day, rotating check times..  Please stop at the lab.  Please return in 4 months with your sugar log.  - today, we will check a fructosamine level.  HbA1c today was 7.7%, but usually fructosamine levels are more accurate for her. -  continue checking sugars at different times of the day - check 1x a day, rotating checks - advised for yearly eye exams >> she is UTD - + flu shot today - Return to clinic in 4 mo with sugar log     2. HL - Reviewed latest lipid panel from 07/2017: LDL and triglycerides high - Not on a statin - We will try to obtain latest lipid panel from PCP  3.  Obesity -She reduced snacking at night.  She was able to lose 4 pounds since last visit and 7 pounds since the beginning of the year.  I congratulated her and strongly advised her to continue with reducing snacks.  She is also exercising 5 times a week at the gym, early in the morning.  Office Visit on 09/22/2018  Component Date Value Ref Range Status  . Fructosamine 09/22/2018 288* 190 - 270 umol/L Final  .  Hemoglobin A1C 09/22/2018 7.7* 4.0 - 5.6 % Final  HbA1c calculated from the fructosamine is 6.5% -stable.  [name], MD PhD [facility] Endocrinology

## 2018-09-22 NOTE — Patient Instructions (Signed)
You can continue to stay off diabetes medicines for now..  Please let me know if the sugars are consistently lower than 80 or higher than 180.  Check sugars once a day, rotating check times..  Please stop at the lab.  Please return in 4 months with your sugar log.

## 2018-09-22 NOTE — Addendum Note (Signed)
Addended by: Cardell Peach I on: 09/22/2018 10:45 AM   Modules accepted: Orders

## 2018-09-25 LAB — FRUCTOSAMINE: FRUCTOSAMINE: 288 umol/L — AB (ref 190–270)

## 2018-09-29 ENCOUNTER — Telehealth: Payer: Self-pay

## 2018-09-29 NOTE — Telephone Encounter (Signed)
-----   Message from [name], MD sent at 09/28/2018  5:12 PM EDT ----- Lenna Sciara, can you please call pt: HbA1c calculated from the fructosamine is 6.5% -stable.

## 2018-09-29 NOTE — Telephone Encounter (Signed)
Left message for patient to return our call at [phone].  

## 2018-09-30 NOTE — Telephone Encounter (Signed)
Notified patient of message from Dr. Gherghe, patient expressed understanding and agreement. No further questions.  

## 2018-10-29 ENCOUNTER — Ambulatory Visit
Admission: RE | Admit: 2018-10-29 | Discharge: 2018-10-29 | Disposition: A | Discharge status: Home / Self Care | Payer: Medicare PPO | Source: Ambulatory Visit | Attending: Nurse Practitioner | Admitting: Nurse Practitioner

## 2018-10-29 DIAGNOSIS — Z780 Asymptomatic menopausal state: Secondary | ICD-10-CM | POA: Diagnosis not present

## 2018-10-29 DIAGNOSIS — E2839 Other primary ovarian failure: Secondary | ICD-10-CM

## 2018-10-29 DIAGNOSIS — Z13820 Encounter for screening for osteoporosis: Secondary | ICD-10-CM | POA: Diagnosis not present

## 2018-12-24 ENCOUNTER — Ambulatory Visit: Payer: Self-pay | Admitting: Nurse Practitioner

## 2019-01-24 ENCOUNTER — Ambulatory Visit: Payer: Medicare PPO | Admitting: Internal Medicine

## 2019-02-15 ENCOUNTER — Other Ambulatory Visit: Payer: Self-pay | Admitting: Internal Medicine

## 2019-02-15 DIAGNOSIS — Z1231 Encounter for screening mammogram for malignant neoplasm of breast: Secondary | ICD-10-CM

## 2019-02-18 ENCOUNTER — Encounter: Payer: Self-pay | Admitting: Nurse Practitioner

## 2019-02-18 ENCOUNTER — Ambulatory Visit: Payer: Medicare PPO | Admitting: Nurse Practitioner

## 2019-02-18 VITALS — BP 130/82 | HR 57 | Temp 97.5°F | Ht 65.0 in | Wt 210.0 lb

## 2019-02-18 DIAGNOSIS — E119 Type 2 diabetes mellitus without complications: Secondary | ICD-10-CM | POA: Diagnosis not present

## 2019-02-18 DIAGNOSIS — E785 Hyperlipidemia, unspecified: Secondary | ICD-10-CM

## 2019-02-18 LAB — POCT UA - MICROALBUMIN
Creatinine, POC: 200 mg/dL
Microalbumin Ur, POC: 30 mg/L

## 2019-02-18 LAB — POCT URINALYSIS DIP (PROADVANTAGE DEVICE)
Bilirubin, UA: NEGATIVE
Glucose, UA: NEGATIVE mg/dL
Ketones, POC UA: NEGATIVE mg/dL
NITRITE UA: NEGATIVE
PH UA: 5.5 (ref 5.0–8.0)
PROTEIN UA: NEGATIVE mg/dL
RBC UA: NEGATIVE
Specific Gravity, Urine: 1.025
UUROB: 0.2

## 2019-02-18 LAB — LIPID PANEL
Chol/HDL Ratio: 3.9 ratio (ref 0.0–4.4)
Cholesterol, Total: 201 mg/dL — ABNORMAL HIGH (ref 100–199)
HDL: 52 mg/dL (ref >39)
LDL Calculated: 133 mg/dL — ABNORMAL HIGH (ref 0–99)
TRIGLYCERIDES: 81 mg/dL (ref 0–149)
VLDL Cholesterol Cal: 16 mg/dL (ref 5–40)

## 2019-02-18 LAB — GLUCOSE, POCT (MANUAL RESULT ENTRY): POC Glucose: 180 mg/dl — AB (ref 70–99)

## 2019-02-18 NOTE — Progress Notes (Signed)
  Subjective:     Patient ID: Leah Robinson , female    DOB: [birth year] , 68 y.o.   MRN: [id number]   Chief Complaint  Patient presents with  . other    Diabetes check   HPI  She is exercising 5 days a week at the [facility].   Diabetes  She presents for her follow-up ([place]) diabetic visit. She has type 2 diabetes mellitus. Pertinent negatives for hypoglycemia include no dizziness or headaches. Pertinent negatives for diabetes include no blurred vision, no chest pain and no fatigue. Symptoms are stable. There are no diabetic complications. There are no known risk factors for coronary artery disease. Current diabetic treatment includes diet. She is compliant with treatment all of the time. She has not had a previous visit with a dietitian.     No past medical history on file.   Family History  Problem Relation Age of Onset  . Breast cancer Neg Hx      Current Outpatient Medications:  .  ACCU-CHEK FASTCLIX LANCETS MISC, Use to check sugar once daily, Disp: 102 each, Rfl: 3 .  Blood Glucose Monitoring Suppl (ACCU-CHEK GUIDE) w/Device KIT, 1 Device by Does not apply route daily., Disp: 1 kit, Rfl: 0 .  cholecalciferol (VITAMIN D) 1000 UNITS tablet, Take 1,000 Units by mouth daily., Disp: , Rfl:  .  glucose blood (ACCU-CHEK GUIDE) test strip, Use as instructed to check sugar once daily, Disp: 100 each, Rfl: 3 .  Multiple Vitamin (MULTIVITAMIN WITH MINERALS) TABS tablet, Take 1 tablet by mouth daily., Disp: , Rfl:    No Known Allergies   Review of Systems  Constitutional: Negative for fatigue and fever.  Eyes: Negative for blurred vision.  Respiratory: Negative.  Negative for cough.   Cardiovascular: Negative.  Negative for chest pain, palpitations and leg swelling.  Musculoskeletal: Negative.   Neurological: Negative for dizziness and headaches.     Today's Vitals   02/18/19 0910  BP: 130/82  Pulse: (!) 57  Temp: (!) 97.5 F (36.4 C)  SpO2: 97%  Weight: 210 lb (95.3 kg)   Height: _0  (1.651 m)   Body mass index is 34.95 kg/m.   Objective:  Physical Exam Constitutional:      Appearance: Normal appearance.  Cardiovascular:     Rate and Rhythm: Normal rate.     Pulses: Normal pulses.     Heart sounds: Normal heart sounds. No murmur.  Pulmonary:     Effort: Pulmonary effort is normal. No respiratory distress.     Breath sounds: Normal breath sounds. No wheezing.  Musculoskeletal: Normal range of motion.        General: No swelling or tenderness.  Skin:    General: Skin is warm and dry.  Neurological:     General: No focal deficit present.     Mental Status: She is alert and oriented to person, place, and time.         Assessment And Plan:     1.1. Diet-controlled diabetes mellitus ([place])  Chronic  She is being followed by Dr. Renne Crigler  Per Dr. Renne Crigler last visit she has a HgbA1c of 6.5 - POCT UA - Microalbumin - POCT glucose (manual entry) - POCT Urinalysis DIP (Proadvantage Device)  2. Hyperlipidemia, unspecified hyperlipidemia type  Chronic, slightly worse in Sept  She is diet controlled  Will check lipid panel.        Minette Brine, FNP

## 2019-03-18 ENCOUNTER — Ambulatory Visit: Payer: Medicare PPO

## 2019-04-14 ENCOUNTER — Ambulatory Visit: Payer: Medicare PPO

## 2019-04-19 ENCOUNTER — Ambulatory Visit: Payer: Medicare PPO | Admitting: Internal Medicine

## 2019-05-26 ENCOUNTER — Ambulatory Visit: Payer: Medicare PPO

## 2019-07-05 ENCOUNTER — Ambulatory Visit (INDEPENDENT_AMBULATORY_CARE_PROVIDER_SITE_OTHER): Payer: Medicare PPO

## 2019-07-05 ENCOUNTER — Other Ambulatory Visit: Payer: Self-pay

## 2019-07-05 ENCOUNTER — Ambulatory Visit: Payer: Medicare PPO | Admitting: Podiatry

## 2019-07-05 ENCOUNTER — Encounter: Payer: Self-pay | Admitting: Podiatry

## 2019-07-05 ENCOUNTER — Other Ambulatory Visit: Payer: Self-pay | Admitting: *Deleted

## 2019-07-05 VITALS — BP 151/66 | HR 75 | Temp 97.1°F | Resp 16

## 2019-07-05 DIAGNOSIS — M778 Other enthesopathies, not elsewhere classified: Secondary | ICD-10-CM

## 2019-07-05 DIAGNOSIS — Z20822 Contact with and (suspected) exposure to covid-19: Secondary | ICD-10-CM

## 2019-07-05 DIAGNOSIS — M19079 Primary osteoarthritis, unspecified ankle and foot: Secondary | ICD-10-CM

## 2019-07-05 DIAGNOSIS — M779 Enthesopathy, unspecified: Secondary | ICD-10-CM | POA: Diagnosis not present

## 2019-07-05 MED ORDER — MELOXICAM 15 MG PO TABS: 15.0000 mg | ORAL_TABLET | Freq: Every day | ORAL | 3 refills | Status: DC

## 2019-07-05 NOTE — Progress Notes (Signed)
  Subjective:  Patient ID: Leah Robinson, female    DOB: Feb 07, 1951,  MRN: [id number] HPI Chief Complaint  Patient presents with  . Foot Pain    Dorsal midfoot/anterior aknle bilateral - aching x 2 weeks, no injury (R>L), active in swimming and walking all week, tried aleve  . Diabetes    Diet controlled  . New Patient (Initial Visit)    68 y.o. female presents with the above complaint.   ROS: Denies fever chills nausea vomiting muscle aches pains calf pain back pain chest pain shortness of breath.  No past medical history on file. Past Surgical History:  Procedure Laterality Date  . DILATATION & CURRETTAGE/HYSTEROSCOPY WITH RESECTOCOPE N/A 06/21/2015   Procedure: DILATATION & CURETTAGE/HYSTEROSCOPY WITH RESECTOCOPE;  Surgeon: Servando Salina, MD;  Location: [facility];  Service: Gynecology;  Laterality: N/A;  . ROBOTIC ASSISTED SALPINGO OOPHERECTOMY Left 06/21/2015   Procedure: ROBOTIC ASSISTED LEFT SALPINGO OOPHORECTOMY AND RIGHT SALPINGECTOMY With  Pelvic Washings;  Surgeon: Servando Salina, MD;  Location: [facility];  Service: Gynecology;  Laterality: Left;  2 1/2 hrs.  . TUBAL LIGATION      Current Outpatient Medications:  .  ACCU-CHEK FASTCLIX LANCETS MISC, Use to check sugar once daily, Disp: 102 each, Rfl: 3 .  Blood Glucose Monitoring Suppl (ACCU-CHEK GUIDE) w/Device KIT, 1 Device by Does not apply route daily., Disp: 1 kit, Rfl: 0 .  cholecalciferol (VITAMIN D) 1000 UNITS tablet, Take 1,000 Units by mouth daily., Disp: , Rfl:  .  glucose blood (ACCU-CHEK GUIDE) test strip, Use as instructed to check sugar once daily, Disp: 100 each, Rfl: 3 .  meloxicam (MOBIC) 15 MG tablet, Take 1 tablet (15 mg total) by mouth daily., Disp: 30 tablet, Rfl: 3 .  Multiple Vitamin (MULTIVITAMIN WITH MINERALS) TABS tablet, Take 1 tablet by mouth daily., Disp: , Rfl:   No Known Allergies Review of Systems Objective:   Vitals:   07/05/19 1339  BP: (!) 151/66  Pulse: 75  Resp: 16  Temp: (!)  97.1 F (36.2 C)    General: Well developed, nourished, in no acute distress, alert and oriented x3   Dermatological: Skin is warm, dry and supple bilateral. Nails x 10 are well maintained; remaining integument appears unremarkable at this time. There are no open sores, no preulcerative lesions, no rash or signs of infection present.  Vascular: Dorsalis Pedis artery and Posterior Tibial artery pedal pulses are 2/4 bilateral with immedate capillary fill time. Pedal hair growth present. No varicosities and no lower extremity edema present bilateral.   Neruologic: Grossly intact via light touch bilateral. Vibratory intact via tuning fork bilateral. Protective threshold with Semmes Wienstein monofilament intact to all pedal sites bilateral. Patellar and Achilles deep tendon reflexes 2+ bilateral. No Babinski or clonus noted bilateral.   Musculoskeletal: No gross boney pedal deformities bilateral. No pain, crepitus, or limitation noted with foot and ankle range of motion bilateral. Muscular strength 5/5 in all groups tested bilateral.  Gait: Unassisted, Nonantalgic.    Radiographs:  Radiographs taken today demonstrate considerable osteoarthritic changes of the midtarsal joints bilateral left things to be worse than the right considerable's spurring dorsally subtalar joint changes and TMT changes.  Assessment & Plan:   Assessment: Osteoarthritis of the midfoot.  Plan: Discussed appropriate shoe gear stretching exercise ice therapy and shoe gear modifications.  Started her on meloxicam daily I will follow-up with her thank you in a couple months.     Max T. [place], [place]

## 2019-07-06 DIAGNOSIS — R6889 Other general symptoms and signs: Secondary | ICD-10-CM | POA: Diagnosis not present

## 2019-07-07 ENCOUNTER — Ambulatory Visit
Admission: RE | Admit: 2019-07-07 | Discharge: 2019-07-07 | Disposition: A | Discharge status: Home / Self Care | Payer: Medicare PPO | Source: Ambulatory Visit | Attending: Internal Medicine | Admitting: Internal Medicine

## 2019-07-07 ENCOUNTER — Other Ambulatory Visit: Payer: Self-pay

## 2019-07-07 DIAGNOSIS — Z1231 Encounter for screening mammogram for malignant neoplasm of breast: Secondary | ICD-10-CM | POA: Diagnosis not present

## 2019-07-10 LAB — NOVEL CORONAVIRUS, NAA: SARS-CoV-2, NAA: NOT DETECTED

## 2019-08-16 ENCOUNTER — Ambulatory Visit: Payer: Medicare PPO | Admitting: Podiatry

## 2019-08-25 ENCOUNTER — Encounter: Payer: Self-pay | Admitting: Podiatry

## 2019-08-25 ENCOUNTER — Ambulatory Visit: Payer: Medicare PPO | Admitting: Podiatry

## 2019-08-25 ENCOUNTER — Other Ambulatory Visit: Payer: Self-pay

## 2019-08-25 DIAGNOSIS — M7751 Other enthesopathy of right foot: Secondary | ICD-10-CM

## 2019-08-25 DIAGNOSIS — M7752 Other enthesopathy of left foot: Secondary | ICD-10-CM | POA: Diagnosis not present

## 2019-08-25 DIAGNOSIS — M778 Other enthesopathies, not elsewhere classified: Secondary | ICD-10-CM

## 2019-08-25 NOTE — Progress Notes (Signed)
She presents today for follow-up of capsulitis dorsal aspect of bilateral foot states that the Mobic really did not help Aleve seems to help better.  Objective: Vital signs are stable alert and oriented x3.  Pulses are palpable.  She has pain across the dorsal aspect of the bilateral foot at the TMT joints.  Review of radiographs consistent with osteoarthritis.  Assessment: Osteoarthritis TMT joints bilateral right greater than left.  Plan: At this point after thorough discussion we injected 10 mg of Kenalog 5 mg of Marcaine to the dorsal aspect of the bilateral foot.  She tolerated procedure well without complications.  Follow-up with her in 2 months.

## 2019-08-30 ENCOUNTER — Other Ambulatory Visit: Payer: Self-pay

## 2019-08-31 ENCOUNTER — Ambulatory Visit (INDEPENDENT_AMBULATORY_CARE_PROVIDER_SITE_OTHER): Payer: Medicare PPO | Admitting: Nurse Practitioner

## 2019-08-31 ENCOUNTER — Encounter: Payer: Self-pay | Admitting: Nurse Practitioner

## 2019-08-31 ENCOUNTER — Ambulatory Visit: Payer: Self-pay

## 2019-08-31 ENCOUNTER — Ambulatory Visit (INDEPENDENT_AMBULATORY_CARE_PROVIDER_SITE_OTHER): Payer: Medicare PPO

## 2019-08-31 VITALS — BP 138/82 | HR 68 | Temp 98.5°F | Ht 64.4 in | Wt 210.0 lb

## 2019-08-31 VITALS — BP 138/82 | HR 68 | Temp 98.5°F | Ht 64.4 in | Wt 210.6 lb

## 2019-08-31 DIAGNOSIS — Z0000 Encounter for general adult medical examination without abnormal findings: Secondary | ICD-10-CM | POA: Diagnosis not present

## 2019-08-31 DIAGNOSIS — E119 Type 2 diabetes mellitus without complications: Secondary | ICD-10-CM | POA: Diagnosis not present

## 2019-08-31 DIAGNOSIS — E669 Obesity, unspecified: Secondary | ICD-10-CM

## 2019-08-31 DIAGNOSIS — Z1159 Encounter for screening for other viral diseases: Secondary | ICD-10-CM

## 2019-08-31 DIAGNOSIS — Z23 Encounter for immunization: Secondary | ICD-10-CM

## 2019-08-31 LAB — POCT URINALYSIS DIPSTICK
Bilirubin, UA: NEGATIVE
Blood, UA: NEGATIVE
Glucose, UA: NEGATIVE
Ketones, UA: NEGATIVE
Leukocytes, UA: NEGATIVE
Nitrite, UA: NEGATIVE
Protein, UA: POSITIVE — AB
Spec Grav, UA: 1.025 (ref 1.010–1.025)
Urobilinogen, UA: 0.2 E.U./dL
pH, UA: 5 (ref 5.0–8.0)

## 2019-08-31 NOTE — Patient Instructions (Addendum)
Health Maintenance  Topic Date Due  . OPHTHALMOLOGY EXAM  02/26/1961  . PNA vac Low Risk Adult (1 of 2 - PCV13) 08/30/2020 (Originally 02/27/2016)  . URINE MICROALBUMIN  02/19/2020  . HEMOGLOBIN A1C  02/28/2020  . FOOT EXAM  08/30/2020  . COLONOSCOPY  02/07/2021  . MAMMOGRAM  07/06/2021  . TETANUS/TDAP  08/30/2028  . INFLUENZA VACCINE  Completed  . DEXA SCAN  Completed  . Hepatitis C Screening  Completed    Health Maintenance, Female Adopting a healthy lifestyle and getting preventive care are important in promoting health and wellness. Ask your health care provider about:  The right schedule for you to have regular tests and exams.  Things you can do on your own to prevent diseases and keep yourself healthy. What should I know about diet, weight, and exercise? Eat a healthy diet   Eat a diet that includes plenty of vegetables, fruits, low-fat dairy products, and lean protein.  Do not eat a lot of foods that are high in solid fats, added sugars, or sodium. Maintain a healthy weight Body mass index (BMI) is used to identify weight problems. It estimates body fat based on height and weight. Your health care provider can help determine your BMI and help you achieve or maintain a healthy weight. Get regular exercise Get regular exercise. This is one of the most important things you can do for your health. Most adults should:  Exercise for at least 150 minutes each week. The exercise should increase your heart rate and make you sweat (moderate-intensity exercise).  Do strengthening exercises at least twice a week. This is in addition to the moderate-intensity exercise.  Spend less time sitting. Even light physical activity can be beneficial. Watch cholesterol and blood lipids Have your blood tested for lipids and cholesterol at 68 years of age, then have this test every 5 years. Have your cholesterol levels checked more often if:  Your lipid or cholesterol levels are high.  You  are older than 68 years of age.  You are at high risk for heart disease. What should I know about cancer screening? Depending on your health history and family history, you may need to have cancer screening at various ages. This may include screening for:  Breast cancer.  Cervical cancer.  Colorectal cancer.  Skin cancer.  Lung cancer. What should I know about heart disease, diabetes, and high blood pressure? Blood pressure and heart disease  High blood pressure causes heart disease and increases the risk of stroke. This is more likely to develop in people who have high blood pressure readings, are of African descent, or are overweight.  Have your blood pressure checked: ? Every 3-5 years if you are 30-15 years of age. ? Every year if you are 37 years old or older. Diabetes Have regular diabetes screenings. This checks your fasting blood sugar level. Have the screening done:  Once every three years after age 54 if you are at a normal weight and have a low risk for diabetes.  More often and at a younger age if you are overweight or have a high risk for diabetes. What should I know about preventing infection? Hepatitis B If you have a higher risk for hepatitis B, you should be screened for this virus. Talk with your health care provider to find out if you are at risk for hepatitis B infection. Hepatitis C Testing is recommended for:  Everyone born from [birth year] through 1965.  Anyone with known risk factors for  hepatitis C. Sexually transmitted infections (STIs)  Get screened for STIs, including gonorrhea and chlamydia, if: ? You are sexually active and are younger than 68 years of age. ? You are older than 68 years of age and your health care provider tells you that you are at risk for this type of infection. ? Your sexual activity has changed since you were last screened, and you are at increased risk for chlamydia or gonorrhea. Ask your health care provider if you are at risk.   Ask your health care provider about whether you are at high risk for HIV. Your health care provider may recommend a prescription medicine to help prevent HIV infection. If you choose to take medicine to prevent HIV, you should first get tested for HIV. You should then be tested every 3 months for as long as you are taking the medicine. Pregnancy  If you are about to stop having your period (premenopausal) and you may become pregnant, seek counseling before you get pregnant.  Take 400 to 800 micrograms (mcg) of folic acid every day if you become pregnant.  Ask for birth control (contraception) if you want to prevent pregnancy. Osteoporosis and menopause Osteoporosis is a disease in which the bones lose minerals and strength with aging. This can result in bone fractures. If you are 62 years old or older, or if you are at risk for osteoporosis and fractures, ask your health care provider if you should:  Be screened for bone loss.  Take a calcium or vitamin D supplement to lower your risk of fractures.  Be given hormone replacement therapy (HRT) to treat symptoms of menopause. Follow these instructions at home: Lifestyle  Do not use any products that contain nicotine or tobacco, such as cigarettes, e-cigarettes, and chewing tobacco. If you need help quitting, ask your health care provider.  Do not use street drugs.  Do not share needles.  Ask your health care provider for help if you need support or information about quitting drugs. Alcohol use  Do not drink alcohol if: ? Your health care provider tells you not to drink. ? You are pregnant, may be pregnant, or are planning to become pregnant.  If you drink alcohol: ? Limit how much you use to 0-1 drink a day. ? Limit intake if you are breastfeeding.  Be aware of how much alcohol is in your drink. In the [place] one drink equals one 12 oz bottle of beer (355 mL), one 5 oz glass of wine (148 mL), or one 1 oz glass of hard liquor (44  mL). General instructions  Schedule regular health, dental, and eye exams.  Stay current with your vaccines.  Tell your health care provider if: ? You often feel depressed. ? You have ever been abused or do not feel safe at home. Summary  Adopting a healthy lifestyle and getting preventive care are important in promoting health and wellness.  Follow your health care provider's instructions about healthy diet, exercising, and getting tested or screened for diseases.  Follow your health care provider's instructions on monitoring your cholesterol and blood pressure. This information is not intended to replace advice given to you by your health care provider. Make sure you discuss any questions you have with your health care provider. Document Released: 06/23/2011 Document Revised: 12/01/2018 Document Reviewed: 12/01/2018 Elsevier Patient Education  [place].  Influenza Virus Vaccine (Flucelvax) What is this medicine? INFLUENZA VIRUS VACCINE (in floo EN zuh VAHY ruhs vak SEEN) helps to reduce the  risk of getting influenza also known as the flu. The vaccine only helps protect you against some strains of the flu. This medicine may be used for other purposes; ask your health care provider or pharmacist if you have questions. COMMON BRAND NAME(S): FLUCELVAX What should I tell my health care provider before I take this medicine? They need to know if you have any of these conditions:  bleeding disorder like hemophilia  fever or infection  Guillain-Barre syndrome or other neurological problems  immune system problems  infection with the human immunodeficiency virus (HIV) or AIDS  low blood platelet counts  multiple sclerosis  an unusual or allergic reaction to influenza virus vaccine, other medicines, foods, dyes or preservatives  pregnant or trying to get pregnant  breast-feeding How should I use this medicine? This vaccine is for injection into a muscle. It is given  by a health care professional. A copy of Vaccine Information Statements will be given before each vaccination. Read this sheet carefully each time. The sheet may change frequently. Talk to your pediatrician regarding the use of this medicine in children. Special care may be needed. Overdosage: If you think you've taken too much of this medicine contact a poison control center or emergency room at once. Overdosage: If you think you have taken too much of this medicine contact a poison control center or emergency room at once. NOTE: This medicine is only for you. Do not share this medicine with others. What if I miss a dose? This does not apply. What may interact with this medicine?  chemotherapy or radiation therapy  medicines that lower your immune system like etanercept, anakinra, infliximab, and adalimumab  medicines that treat or prevent blood clots like warfarin  phenytoin  steroid medicines like prednisone or cortisone  theophylline  vaccines This list may not describe all possible interactions. Give your health care provider a list of all the medicines, herbs, non-prescription drugs, or dietary supplements you use. Also tell them if you smoke, drink alcohol, or use illegal drugs. Some items may interact with your medicine. What should I watch for while using this medicine? Report any side effects that do not go away within 3 days to your doctor or health care professional. Call your health care provider if any unusual symptoms occur within 6 weeks of receiving this vaccine. You may still catch the flu, but the illness is not usually as bad. You cannot get the flu from the vaccine. The vaccine will not protect against colds or other illnesses that may cause fever. The vaccine is needed every year. What side effects may I notice from receiving this medicine? Side effects that you should report to your doctor or health care professional as soon as possible:  allergic reactions like  skin rash, itching or hives, swelling of the face, lips, or tongue Side effects that usually do not require medical attention (Report these to your doctor or health care professional if they continue or are bothersome.):  fever  headache  muscle aches and pains  pain, tenderness, redness, or swelling at the injection site  tiredness This list may not describe all possible side effects. Call your doctor for medical advice about side effects. You may report side effects to FDA at 1-800-FDA-1088. Where should I keep my medicine? The vaccine will be given by a health care professional in a clinic, pharmacy, doctor's office, or other health care setting. You will not be given vaccine doses to store at home. NOTE: This sheet is a  summary. It may not cover all possible information. If you have questions about this medicine, talk to your doctor, pharmacist, or health care provider.  2020 Elsevier/Gold Standard (2011-11-19 14:06:47)

## 2019-08-31 NOTE — Progress Notes (Signed)
Subjective:   Leah Robinson is a 68 y.o. female who presents for Medicare Annual (Subsequent) preventive examination.  Review of Systems:  n/a Cardiac Risk Factors include: advanced age (>57mn, >>26women);diabetes mellitus;dyslipidemia;obesity (BMI >30kg/m2)     Objective:     Vitals: BP 138/82 (BP Location: Left Arm, Patient Position: Sitting)   Pulse 68   Temp 98.5 F (36.9 C) (Oral)   Ht 5' 4.4" (1.636 m)   Wt 210 lb (95.3 kg)   BMI 35.60 kg/m   Body mass index is 35.6 kg/m.  Advanced Directives 08/31/2019 06/21/2015 06/15/2015  Does Patient Have a Medical Advance Directive? Yes No No  Type of AParamedicof ASylvesterLiving will - -  Copy of H[place]am[place]alin Chart? No - copy requested - -  Would patient like information on creating a medical advance directive? - - Yes - EScientist, clinical (histocompatibility and immunogenetics)given    Tobacco Social History   Tobacco Use  Smoking Status Former Smoker  Smokeless Tobacco Never Used     Counseling given: Not Answered   Clinical Intake:  Pre-visit preparation completed: Yes  Pain : No/denies pain     Nutritional Status: BMI > 30  Obese Nutritional Risks: None Diabetes: Yes CBG done?: No Did pt. bring in CBG monitor from home?: No  How often do you need to have someone help you when you read instructions, pamphlets, or other written materials from your doctor or pharmacy?: 1 - Never What is the last grade level you completed in school?: 2 years college  Interpreter Needed?: No  Information entered by :: NAllen LPN  History reviewed. No pertinent past medical history. Past Surgical History:  Procedure Laterality Date  . DILATATION & CURRETTAGE/HYSTEROSCOPY WITH RESECTOCOPE N/A 06/21/2015   Procedure: DILATATION & CURETTAGE/HYSTEROSCOPY WITH RESECTOCOPE;  Surgeon: SServando Salina MD;  [facility];  Service: Gynecology;  Laterality: N/A;  . ROBOTIC ASSISTED SALPINGO OOPHERECTOMY Left 06/21/2015    Procedure: ROBOTIC ASSISTED LEFT SALPINGO OOPHORECTOMY AND RIGHT SALPINGECTOMY With  Pelvic Washings;  Surgeon: SServando Salina MD;  [facility];  Service: Gynecology;  Laterality: Left;  2 1/2 hrs.  . TUBAL LIGATION     Family History  Problem Relation Age of Onset  . Breast cancer Neg Hx    Social History   Socioeconomic History  . Marital status: Married    Spouse name: Not on file  . Number of children: Not on file  . Years of education: Not on file  . Highest education level: Not on file  Occupational History  . Occupation: retired  . Occupation: part time work  SScientific laboratory technician . Financial resource strain: Not hard at all  . Food insecurity    Worry: Never true    Inability: Never true  . Transportation needs    Medical: No    Non-medical: No  Tobacco Use  . Smoking status: Former SResearch scientist (life sciences) . Smokeless tobacco: Never Used  Substance and Sexual Activity  . Alcohol use: No  . Drug use: No  . Sexual activity: Not Currently  Lifestyle  . Physical activity    Days per week: 6 days    Minutes per session: 120 min  . Stress: Not at all  Relationships  . Social [place] phone: Not on file    Gets together: Not on file    Attends religious service: Not on file    Active member of club or organization: Not on  file    Attends meetings of clubs or organizations: Not on file    Relationship status: Not on file  Other Topics Concern  . Not on file  Social History Narrative  . Not on file    Outpatient Encounter Medications as of 08/31/2019  Medication Sig  . ACCU-CHEK FASTCLIX LANCETS MISC Use to check sugar once daily  . Blood Glucose Monitoring Suppl (ACCU-CHEK GUIDE) w/Device KIT 1 Device by Does not apply route daily.  . cholecalciferol (VITAMIN D) 1000 UNITS tablet Take 1,000 Units by mouth daily.  Marland Kitchen glucose blood (ACCU-CHEK GUIDE) test strip Use as instructed to check sugar once daily  . Multiple Vitamin (MULTIVITAMIN WITH MINERALS) TABS  tablet Take 1 tablet by mouth daily.   No facility-administered encounter medications on file as of 08/31/2019.     Activities of Daily Living In your present state of health, do you have any difficulty performing the following activities: 08/31/2019 08/31/2019  Hearing? Y N  Comment a little bit -  Vision? N N  Difficulty concentrating or making decisions? N N  Walking or climbing stairs? N N  Dressing or bathing? N N  Doing errands, shopping? N N  Preparing Food and eating ? N -  Using the Toilet? N -  In the past six months, have you accidently leaked urine? N -  Do you have problems with loss of bowel control? N -  Managing your Medications? N -  Managing your Finances? N -  Housekeeping or managing your Housekeeping? N -  Some recent data might be hidden    Patient Care Team: Minette Brine, FNP as PCP - General (General Practice)    Assessment:   This is a routine wellness examination for Leah.  Exercise Activities and Dietary recommendations Current Exercise Habits: Home exercise routine, Type of exercise: walking, Time (Minutes): > 60, Frequency (Times/Week): 6, Weekly Exercise (Minutes/Week): 0  Goals    . Weight (lb) < 200 lb (90.7 kg)     08/31/2019, wants to get out of the 200's       Fall Risk Fall Risk  08/31/2019 08/31/2019  Falls in the past year? 0 0  Follow up Falls evaluation completed;Education provided;Falls prevention discussed -   Is the patient's home free of loose throw rugs in walkways, pet beds, electrical cords, etc?   yes      Grab bars in the bathroom? yes      Handrails on the stairs?   n/a      Adequate lighting?   yes  Timed Get Up and Go performed: n/a  Depression Screen PHQ 2/9 Scores 08/31/2019 08/31/2019  PHQ - 2 Score 0 0  PHQ- 9 Score 0 -     Cognitive Function     6CIT Screen 08/31/2019  What Year? 0 points  What month? 0 points  What time? 0 points  Count back from 20 0 points  Months in reverse 0 points  Repeat phrase 0  points  Total Score 0    Immunization History  Administered Date(s) Administered  . Influenza, High Dose Seasonal PF 09/22/2018  . Tdap 08/30/2018    Qualifies for Shingles Vaccine? yes  Screening Tests Health Maintenance  Topic Date Due  . OPHTHALMOLOGY EXAM  02/26/1961  . PNA vac Low Risk Adult (1 of 2 - PCV13) 02/27/2016  . HEMOGLOBIN A1C  03/24/2019  . INFLUENZA VACCINE  07/23/2019  . URINE MICROALBUMIN  02/19/2020  . FOOT EXAM  08/30/2020  . COLONOSCOPY  02/07/2021  . MAMMOGRAM  07/06/2021  . TETANUS/TDAP  08/30/2028  . DEXA SCAN  Completed  . Hepatitis C Screening  Completed    Cancer Screenings: Lung: Low Dose CT Chest recommended if Age 108-80 years, 30 pack-year currently smoking OR have quit w/in 15years. Patient does not qualify. Breast:  Up to date on Mammogram? Yes   Up to date of Bone Density/Dexa? Yes Colorectal: up to date  Additional Screenings: : Hepatitis C Screening: today     Plan:    Patient wants to lose weight. She wants to get out of the 200's.   I have personally reviewed and noted the following in the patient's chart:   . Medical and social history . Use of alcohol, tobacco or illicit drugs  . Current medications and supplements . Functional ability and status . Nutritional status . Physical activity . Advanced directives . List of other physicians . Hospitalizations, surgeries, and ER visits in previous 12 months . Vitals . Screenings to include cognitive, depression, and falls . Referrals and appointments  In addition, I have reviewed and discussed with patient certain preventive protocols, quality metrics, and best practice recommendations. A written personalized care plan for preventive services as well as general preventive health recommendations were provided to patient.     Kellie Simmering, LPN  04/24/3605

## 2019-08-31 NOTE — Patient Instructions (Signed)
Leah Robinson , Thank you for taking time to come for your Medicare Wellness Visit. I appreciate your ongoing commitment to your health goals. Please review the following plan we discussed and let me know if I can assist you in the future.   Screening recommendations/referrals: Colonoscopy: 01/2011 Mammogram: 06/2019 Bone Density: 10/2018 Recommended yearly ophthalmology/optometry visit for glaucoma screening and checkup Recommended yearly dental visit for hygiene and checkup  Vaccinations: Influenza vaccine: today Pneumococcal vaccine: decline Tdap vaccine: 08/2018 Shingles vaccine: discussed    Advanced directives: Please bring a copy of your POA (Power of [place]) and/or Living Will to your next appointment.    Conditions/risks identified: obesity  Next appointment: 02/27/2020 at 8:30   Preventive Care 30 Years and Older, Female Preventive care refers to lifestyle choices and visits with your health care provider that can promote health and wellness. What does preventive care include?  A yearly physical exam. This is also called an annual well check.  Dental exams once or twice a year.  Routine eye exams. Ask your health care provider how often you should have your eyes checked.  Personal lifestyle choices, including:  Daily care of your teeth and gums.  Regular physical activity.  Eating a healthy diet.  Avoiding tobacco and drug use.  Limiting alcohol use.  Practicing safe sex.  Taking low-dose aspirin every day.  Taking vitamin and mineral supplements as recommended by your health care provider. What happens during an annual well check? The services and screenings done by your health care provider during your annual well check will depend on your age, overall health, lifestyle risk factors, and family history of disease. Counseling  Your health care provider may ask you questions about your:  Alcohol use.  Tobacco use.  Drug use.  Emotional well-being.   Home and relationship well-being.  Sexual activity.  Eating habits.  History of falls.  Memory and ability to understand (cognition).  Work and work Statistician.  Reproductive health. Screening  You may have the following tests or measurements:  Height, weight, and BMI.  Blood pressure.  Lipid and cholesterol levels. These may be checked every 5 years, or more frequently if you are over 36 years old.  Skin check.  Lung cancer screening. You may have this screening every year starting at age 89 if you have a 30-pack-year history of smoking and currently smoke or have quit within the past 15 years.  Fecal occult blood test (FOBT) of the stool. You may have this test every year starting at age 66.  Flexible sigmoidoscopy or colonoscopy. You may have a sigmoidoscopy every 5 years or a colonoscopy every 10 years starting at age 37.  Hepatitis C blood test.  Hepatitis B blood test.  Sexually transmitted disease (STD) testing.  Diabetes screening. This is done by checking your blood sugar (glucose) after you have not eaten for a while (fasting). You may have this done every 1-3 years.  Bone density scan. This is done to screen for osteoporosis. You may have this done starting at age 64.  Mammogram. This may be done every 1-2 years. Talk to your health care provider about how often you should have regular mammograms. Talk with your health care provider about your test results, treatment options, and if necessary, the need for more tests. Vaccines  Your health care provider may recommend certain vaccines, such as:  Influenza vaccine. This is recommended every year.  Tetanus, diphtheria, and acellular pertussis (Tdap, Td) vaccine. You may need a Td booster  every 10 years.  Zoster vaccine. You may need this after age 18.  Pneumococcal 13-valent conjugate (PCV13) vaccine. One dose is recommended after age 67.  Pneumococcal polysaccharide (PPSV23) vaccine. One dose is  recommended after age 15. Talk to your health care provider about which screenings and vaccines you need and how often you need them. This information is not intended to replace advice given to you by your health care provider. Make sure you discuss any questions you have with your health care provider. Document Released: 01/04/2016 Document Revised: 08/27/2016 Document Reviewed: 10/09/2015 Elsevier Interactive Patient Education  2017 [place] Prevention in the Home Falls can cause injuries. They can happen to people of all ages. There are many things you can do to make your home safe and to help prevent falls. What can I do on the outside of my home?  Regularly fix the edges of walkways and driveways and fix any cracks.  Remove anything that might make you trip as you walk through a door, such as a raised step or threshold.  Trim any bushes or trees on the path to your home.  Use bright outdoor lighting.  Clear any walking paths of anything that might make someone trip, such as rocks or tools.  Regularly check to see if handrails are loose or broken. Make sure that both sides of any steps have handrails.  Any raised decks and porches should have guardrails on the edges.  Have any leaves, snow, or ice cleared regularly.  Use sand or salt on walking paths during winter.  Clean up any spills in your garage right away. This includes oil or grease spills. What can I do in the bathroom?  Use night lights.  Install grab bars by the toilet and in the tub and shower. Do not use towel bars as grab bars.  Use non-skid mats or decals in the tub or shower.  If you need to sit down in the shower, use a plastic, non-slip stool.  Keep the floor dry. Clean up any water that spills on the floor as soon as it happens.  Remove soap buildup in the tub or shower regularly.  Attach bath mats securely with double-sided non-slip rug tape.  Do not have throw rugs and other things on  the floor that can make you trip. What can I do in the bedroom?  Use night lights.  Make sure that you have a light by your bed that is easy to reach.  Do not use any sheets or blankets that are too big for your bed. They should not hang down onto the floor.  Have a firm chair that has side arms. You can use this for support while you get dressed.  Do not have throw rugs and other things on the floor that can make you trip. What can I do in the kitchen?  Clean up any spills right away.  Avoid walking on wet floors.  Keep items that you use a lot in easy-to-reach places.  If you need to reach something above you, use a strong step stool that has a grab bar.  Keep electrical cords out of the way.  Do not use floor polish or wax that makes floors slippery. If you must use wax, use non-skid floor wax.  Do not have throw rugs and other things on the floor that can make you trip. What can I do with my stairs?  Do not leave any items on the stairs.  Make  sure that there are handrails on both sides of the stairs and use them. Fix handrails that are broken or loose. Make sure that handrails are as long as the stairways.  Check any carpeting to make sure that it is firmly attached to the stairs. Fix any carpet that is loose or worn.  Avoid having throw rugs at the top or bottom of the stairs. If you do have throw rugs, attach them to the floor with carpet tape.  Make sure that you have a light switch at the top of the stairs and the bottom of the stairs. If you do not have them, ask someone to add them for you. What else can I do to help prevent falls?  Wear shoes that:  Do not have high heels.  Have rubber bottoms.  Are comfortable and fit you well.  Are closed at the toe. Do not wear sandals.  If you use a stepladder:  Make sure that it is fully opened. Do not climb a closed stepladder.  Make sure that both sides of the stepladder are locked into place.  Ask someone to  hold it for you, if possible.  Clearly mark and make sure that you can see:  Any grab bars or handrails.  First and last steps.  Where the edge of each step is.  Use tools that help you move around (mobility aids) if they are needed. These include:  Canes.  Walkers.  Scooters.  Crutches.  Turn on the lights when you go into a dark area. Replace any light bulbs as soon as they burn out.  Set up your furniture so you have a clear path. Avoid moving your furniture around.  If any of your floors are uneven, fix them.  If there are any pets around you, be aware of where they are.  Review your medicines with your doctor. Some medicines can make you feel dizzy. This can increase your chance of falling. Ask your doctor what other things that you can do to help prevent falls. This information is not intended to replace advice given to you by your health care provider. Make sure you discuss any questions you have with your health care provider. Document Released: 10/04/2009 Document Revised: 05/15/2016 Document Reviewed: 01/12/2015 Elsevier Interactive Patient Education  2017 [employer].

## 2019-08-31 NOTE — Progress Notes (Signed)
Subjective:     Patient ID: Leah Robinson , female    DOB: [birth year] , 68 y.o.   MRN: [id number]   Chief Complaint  Patient presents with  . Annual Exam   The patient states she uses post menopausal status for birth control. Last LMP was No LMP recorded. Patient is postmenopausal. Mammogram last done 07/07/2019.  Negative for: breast discharge, breast lump(s), breast pain and breast self exam.  Pertinent negatives include  anxiety, decreased libido, depression, difficulty falling sleep, dyspareunia, history of infertility, nocturia, sexual dysfunction, sleep disturbances, urinary incontinence, urinary urgency, vaginal discharge and vaginal itching. Diet regular.The patient states her exercise level is  walking and swimming.      The patient's tobacco use is:   Social History   Tobacco Use  Smoking Status Former Smoker  Smokeless Tobacco Never Used  . She has been exposed to passive smoke. The patient's alcohol use is:  Social History   Substance and Sexual Activity  Alcohol Use No    HPI  Here for HM  Diabetes She presents for her follow-up diabetic visit. She has type 2 (scheduled to see Dr. Renne Crigler on Monday) diabetes mellitus. Her disease course has been stable. There are no hypoglycemic associated symptoms. There are no diabetic associated symptoms. Pertinent negatives for diabetes include no polydipsia, no polyphagia and no polyuria. There are no hypoglycemic complications. Symptoms are stable. There are no diabetic complications. When asked about current treatments, none were reported. She is compliant with treatment all of the time. (Never over 200)     No past medical history on file.   Family History  Problem Relation Age of Onset  . Breast cancer Neg Hx      Current Outpatient Medications:  .  ACCU-CHEK FASTCLIX LANCETS MISC, Use to check sugar once daily, Disp: 102 each, Rfl: 3 .  Blood Glucose Monitoring Suppl (ACCU-CHEK GUIDE) w/Device KIT, 1 Device by Does  not apply route daily., Disp: 1 kit, Rfl: 0 .  cholecalciferol (VITAMIN D) 1000 UNITS tablet, Take 1,000 Units by mouth daily., Disp: , Rfl:  .  glucose blood (ACCU-CHEK GUIDE) test strip, Use as instructed to check sugar once daily, Disp: 100 each, Rfl: 3 .  Multiple Vitamin (MULTIVITAMIN WITH MINERALS) TABS tablet, Take 1 tablet by mouth daily., Disp: , Rfl:    No Known Allergies   Review of Systems  Constitutional: Negative.   HENT: Negative.   Eyes: Negative.   Respiratory: Negative.   Cardiovascular: Negative.   Gastrointestinal: Negative.   Endocrine: Negative.  Negative for polydipsia, polyphagia and polyuria.  Genitourinary: Negative.   Musculoskeletal: Negative.   Skin: Negative.   Allergic/Immunologic: Negative.   Neurological: Negative.   Hematological: Negative.   Psychiatric/Behavioral: Negative.      Today's Vitals   08/31/19 0848  BP: 138/82  Pulse: 68  Temp: 98.5 F (36.9 C)  TempSrc: Oral  Weight: 210 lb 9.6 oz (95.5 kg)  Height: 5' 4.4" (1.636 m)  PainSc: 0-No pain   Body mass index is 35.7 kg/m.   Objective:  Physical Exam Vitals signs reviewed.  Constitutional:      Appearance: Normal appearance. She is well-developed.  HENT:     Head: Normocephalic and atraumatic.     Right Ear: Hearing, tympanic membrane, ear canal and external ear normal.     Left Ear: Hearing, tympanic membrane, ear canal and external ear normal.     Nose: Nose normal.     Mouth/Throat:  Mouth: Mucous membranes are moist.  Eyes:     General: Lids are normal.     Conjunctiva/sclera: Conjunctivae normal.     Pupils: Pupils are equal, round, and reactive to light.     Funduscopic exam:    Right eye: No papilledema.        Left eye: No papilledema.  Neck:     Musculoskeletal: Full passive range of motion without pain, normal range of motion and neck supple.     Thyroid: No thyroid mass.     Vascular: No carotid bruit.  Cardiovascular:     Rate and Rhythm: Normal  rate and regular rhythm.     Pulses: Normal pulses.     Heart sounds: Normal heart sounds. No murmur.  Pulmonary:     Effort: Pulmonary effort is normal.     Breath sounds: Normal breath sounds.  Chest:     Chest wall: No mass.     Breasts:        Right: Normal. No swelling, mass, nipple discharge or tenderness.        Left: Normal. No swelling, mass, nipple discharge or tenderness.  Abdominal:     General: Abdomen is flat. Bowel sounds are normal.     Palpations: Abdomen is soft.  Musculoskeletal: Normal range of motion.        General: No swelling or tenderness.     Right lower leg: No edema.     Left lower leg: No edema.  Skin:    General: Skin is warm and dry.     Capillary Refill: Capillary refill takes less than 2 seconds.  Neurological:     General: No focal deficit present.     Mental Status: She is alert and oriented to person, place, and time.     Cranial Nerves: No cranial nerve deficit.     Sensory: No sensory deficit.  Psychiatric:        Mood and Affect: Mood normal.        Behavior: Behavior normal.        Thought Content: Thought content normal.        Judgment: Judgment normal.         Assessment And Plan:     1. Health maintenance examination . Behavior modifications discussed and diet history reviewed.   . Pt will continue to exercise regularly and modify diet with low GI, plant based foods and decrease intake of processed foods.  . Recommend intake of daily multivitamin, Vitamin D, and calcium.  . Recommend mammogram for preventive screenings, as well as recommend immunizations that include influenza  2. Obesity (BMI 35.0-39.9 without comorbidity) Chronic Discussed healthy diet options She is exercising regularly and encouraged to continue  3. Need for influenza vaccination  Influenza vaccine given in office  Advised to take Tylenol as needed for muscle aches or fever - Flu vaccine HIGH DOSE PF (Fluzone High dose)  4. 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 Encounter for hepatitis C screening test for low risk patient  Will check for Hepatitis C screening due to being born between the years 40-1965 - Hepatitis C antibody  Leah Bau, FNP    THE PATIENT IS ENCOURAGED TO PRACTICE SOCIAL DISTANCING DUE TO THE COVID-19 PANDEMIC.   

## 2019-09-01 ENCOUNTER — Encounter: Payer: Self-pay | Admitting: Nurse Practitioner

## 2019-09-01 ENCOUNTER — Ambulatory Visit: Payer: Self-pay

## 2019-09-01 LAB — BMP8+EGFR
BUN/Creatinine Ratio: 21 (ref 12–28)
BUN: 18 mg/dL (ref 8–27)
CO2: 21 mmol/L (ref 20–29)
Calcium: 9.4 mg/dL (ref 8.7–10.3)
Chloride: 104 mmol/L (ref 96–106)
Creatinine, Ser: 0.86 mg/dL (ref 0.57–1.00)
GFR calc Af Amer: 80 mL/min/{1.73_m2} (ref >59)
GFR calc non Af Amer: 70 mL/min/{1.73_m2} (ref >59)
Glucose: 207 mg/dL — ABNORMAL HIGH (ref 65–99)
Potassium: 4.7 mmol/L (ref 3.5–5.2)
Sodium: 140 mmol/L (ref 134–144)

## 2019-09-01 LAB — HEPATITIS C ANTIBODY: Hep C Virus Ab: <0.1 s/co ratio (ref 0.0–0.9)

## 2019-09-01 LAB — CBC
Hematocrit: 41.9 % (ref 34.0–46.6)
Hemoglobin: 13.6 g/dL (ref 11.1–15.9)
MCH: 28.6 pg (ref 26.6–33.0)
MCHC: 32.5 g/dL (ref 31.5–35.7)
MCV: 88 fL (ref 79–97)
Platelets: 237 10*3/uL (ref 150–450)
RBC: 4.75 x10E6/uL (ref 3.77–5.28)
RDW: 11.7 % (ref 11.7–15.4)
WBC: 7.3 10*3/uL (ref 3.4–10.8)

## 2019-09-01 LAB — HEMOGLOBIN A1C
Est. average glucose Bld gHb Est-mCnc: 197 mg/dL
Hgb A1c MFr Bld: 8.5 % — ABNORMAL HIGH (ref 4.8–5.6)

## 2019-09-05 ENCOUNTER — Ambulatory Visit: Payer: Medicare PPO | Admitting: Internal Medicine

## 2019-09-05 ENCOUNTER — Encounter: Payer: Self-pay | Admitting: Internal Medicine

## 2019-09-05 ENCOUNTER — Other Ambulatory Visit: Payer: Self-pay

## 2019-09-05 VITALS — BP 130/76 | HR 76 | Ht 64.0 in | Wt 209.8 lb

## 2019-09-05 DIAGNOSIS — E119 Type 2 diabetes mellitus without complications: Secondary | ICD-10-CM | POA: Diagnosis not present

## 2019-09-05 MED ORDER — METFORMIN HCL ER 500 MG PO TB24: 1000.0000 mg | ORAL_TABLET | Freq: Every day | ORAL | 5 refills | Status: DC

## 2019-09-05 NOTE — Patient Instructions (Addendum)
Try to start: - Metformin ER 500 mg x1 tablet with dinner. If you tolerated this well, then increase to 1000 mg (2 tablets) with dinner.  Check sugars once a day, rotating check times.  Please stop at the lab.  Please return in 4 months with your sugar log.

## 2019-09-05 NOTE — Progress Notes (Addendum)
Patient ID: Leah Robinson, female   DOB: 1951-06-21, 68 y.o.   MRN: [id number]   HPI: Leah Robinson is a 68 y.o.-year-old female, initially referred by her [place] FNP (Dr. Glendale Chard - [facility]  Returning for follow-up for DM2, dx as prediabetes in 2016, then DM2 in , non-insulin-dependent, uncontrolled, without long term complications. Last visit 11 months ago.  She has arthritis in feet - saw podiatrist. Wonda Horner.  She was eating more sweets lately.  Reviewed HbA1c levels: Lab Results  Component Value Date   HGBA1C 8.5 (H) 08/31/2019   HGBA1C 7.7 (A) 09/22/2018   HGBA1C 7.9 (A) 05/18/2018  09/22/2018: HbA1c calculated from fructosamine 6.5% 05/18/2018: HbA1c calculated from fructosamine is much better, at 6.5%. 06/30/2017: HbA1c 7.8% Reportedly, prev. in the 6-7% range.  She is not on any medicines for her diabetes. She had to stop Metformin (started 2016) because of diarrhea and abdominal pain. She did not try Metformin ER, although this was recommended by PCP. She could not get this from the pharmacy (?).  Pt checks sugars once a day: - am: 140 >> 140-160 >> 136-150 >> 150-160 - 2h after b'fast: 118-140 >> 128-140 >> 138, 142 >> n/c - before lunch:  110-123 >> 130-140 >> 134-141 - 2h after lunch:126, 212 >> 121-128 >> 129-141 >> n/c - before dinner:  131-143 >> 112-124, 160, 195 >> 130 - 2h after dinner: 118-130, 150 >> 89, 104-140 >> 118 >> 140 - bedtime: n/c >> 134, 149 >> 120 >> n/c >> 126 >> n/c - nighttime: n/c Lowest: 110 Highest: 180 (sweets).  Glucometer: Accu-Chek guide   Pt's meals are: - Breakfast: toast, bacon, cereal, sandwich, ham bisqit, boiled egg - Lunch: salad + nuts, [place] sandwich, tuna - Dinner: greens or veggies, chicken/pork/fish - Snacks: 3 (mostly at night)  -+ Mild CKD, last BUN/creatinine:  Lab Results  Component Value Date   BUN 18 08/31/2019   BUN 17 06/15/2015   CREATININE 0.86 08/31/2019   CREATININE 0.78  06/15/2015  06/30/2017: CMP normal, but with a glucose of 168, BUN/Cr 14/0.86 (GFR 81)  12/10/2016: 10/0.80, eGFR 89, Glu 126  -+ HL. Latest lipids: Lab Results  Component Value Date   CHOL 201 (H) 02/18/2019   HDL 52 02/18/2019   LDLCALC 133 (H) 02/18/2019   TRIG 81 02/18/2019   CHOLHDL 3.9 02/18/2019  06/30/2017: 223/102/58/145  12/10/2016: 186/83/51/118 She is not on a statin.  - last eye exam was in 03/2018: No DR, low stage cataracts -+ Occasional numbness and tingling in her feet.  Before the coronavirus pandemic, she was going to the gym: swimming, walking, weights, Silver sneakers class-  5x a week. Now: walking, swimming.  ROS: Constitutional: no weight gain/no weight loss, no fatigue, no subjective hyperthermia, no subjective hypothermia Eyes: no blurry vision, no xerophthalmia ENT: no sore throat, no nodules palpated in neck, no dysphagia, no odynophagia, no hoarseness Cardiovascular: no CP/no SOB/no palpitations/no leg swelling Respiratory: no cough/no SOB/no wheezing Gastrointestinal: no N/no V/no D/no C/no acid reflux Musculoskeletal: no muscle aches/+ joint aches Skin: no rashes, no hair loss Neurological: no tremors/+ numbness/+ tingling/no dizziness  I reviewed pt's medications, allergies, PMH, social hx, family hx, and changes were documented in the history of present illness. Otherwise, unchanged from my initial visit note.  No past medical history on file. Past Surgical History:  Procedure Laterality Date  . DILATATION & CURRETTAGE/HYSTEROSCOPY WITH RESECTOCOPE N/A 06/21/2015   Procedure: DILATATION & CURETTAGE/HYSTEROSCOPY WITH RESECTOCOPE;  Surgeon: Servando Salina, MD;  Location: [facility];  Service: Gynecology;  Laterality: N/A;  . ROBOTIC ASSISTED SALPINGO OOPHERECTOMY Left 06/21/2015   Procedure: ROBOTIC ASSISTED LEFT SALPINGO OOPHORECTOMY AND RIGHT SALPINGECTOMY With  Pelvic Washings;  Surgeon: Servando Salina, MD;  Location: [facility];  Service:  Gynecology;  Laterality: Left;  2 1/2 hrs.  . TUBAL LIGATION     Social History   Social History  . Marital status: Married    Spouse name: N/A  . Number of children: 1   Occupational History  . Retired from [employer]   Social History Main Topics  . Smoking status: Former Research scientist (life sciences), quit in 1995  . Smokeless tobacco: Never Used  . Alcohol use No  . Drug use: No   Current Outpatient Medications on File Prior to Visit  Medication Sig Dispense Refill  . ACCU-CHEK FASTCLIX LANCETS MISC Use to check sugar once daily 102 each 3  . Blood Glucose Monitoring Suppl (ACCU-CHEK GUIDE) w/Device KIT 1 Device by Does not apply route daily. 1 kit 0  . cholecalciferol (VITAMIN D) 1000 UNITS tablet Take 1,000 Units by mouth daily.    Marland Kitchen glucose blood (ACCU-CHEK GUIDE) test strip Use as instructed to check sugar once daily 100 each 3  . Multiple Vitamin (MULTIVITAMIN WITH MINERALS) TABS tablet Take 1 tablet by mouth daily.     No current facility-administered medications on file prior to visit.    No Known Allergies   Pertinent FH: Pt has FH of DM in mother, maternal uncle.  PE: BP 130/76 (BP Location: Left Arm, Patient Position: Sitting, Cuff Size: Normal)   Pulse 76   Ht _0  (1.626 m)   Wt 209 lb 12.8 oz (95.2 kg)   SpO2 99%   BMI 36.01 kg/m  Wt Readings from Last 3 Encounters:  09/05/19 209 lb 12.8 oz (95.2 kg)  08/31/19 210 lb (95.3 kg)  08/31/19 210 lb 9.6 oz (95.5 kg)   Constitutional: overweight, in NAD Eyes: PERRLA, EOMI, no exophthalmos ENT: moist mucous membranes, no thyromegaly, no cervical lymphadenopathy Cardiovascular: RRR, No MRG Respiratory: CTA B Gastrointestinal: abdomen soft, NT, ND, BS+ Musculoskeletal: no deformities, strength intact in all 4 Skin: moist, warm, no rashes Neurological: no tremor with outstretched hands, DTR normal in all 4  ASSESSMENT: 1. DM2, non-insulin-dependent (diet-controlled), without long term complications, but with hyperglycemia  2.  HL  3.  Obesity  PLAN:  1. Patient with relatively well-controlled diabetes, previously on metformin, which she could not tolerate because of abdominal cramps and diarrhea.  Her sugar levels at home do not usually correlate with HbA1c so we are following her with fructosamine.  At last visit, this was stable, at goal.  Sugars are slightly better but still the highest in the morning.  We discussed about possible reasons for this to include increased her gluconeogenesis overnight, dawn phenomenon, hypoglycemia after dinner.  We did not start medications at that time since sugars in the rest of the day were normal and HbA1c was at goal. -She had a recent HbA1c by PCP, which was higher, at 8.5% but directly measured HbA1c levels are not very accurate for her, we usually followed her by fructosamine levels -At this visit, sugars are higher and we discussed about starting metformin ER.  I explained that this is better tolerated than the regular metformin, to which she had GI side effects.  She agrees to try this.  We will start at a low dose, 1 tablet a day with dinner and  advance as tolerated to 2 tablets with dinner.  I explained that this will help with decreasing liver gluconeogenesis overnight and lower her morning sugars as a consequence.  If she cannot tolerate this, and other option is to try Rybelsus, the p.o. GLP-1 receptor agonist, but I am not sure if this would be covered by her insurance. - I suggested to:  Patient Instructions  Try to start: - Metformin ER 500 mg x1 tablet with dinner. If you tolerated this well, then increase to 1000 mg (2 tablets) with dinner.  Check sugars once a day, rotating check times.  Please stop at the lab.  Please return in 4 months with your sugar log.  - today we will check a fructosamine level  - advised to check sugars at different times of the day - 1x a day, rotating check times - advised for yearly eye exams >> she is UTD - return to clinic in 4  months    2. HL - Reviewed latest lipid panel from earlier this year: LDL above goal, the rest of the fractions at goal Lab Results  Component Value Date   CHOL 201 (H) 02/18/2019   HDL 52 02/18/2019   LDLCALC 133 (H) 02/18/2019   TRIG 81 02/18/2019   CHOLHDL 3.9 02/18/2019  -She is not on a statin and I suggested 1 at this visit, but for now, since we are starting metformin, I would like to see how she is tolerating it and, if she does well with it, we will start a statin at next visit.  She agrees with this.  3.  Obesity -Before last visit, she reduced snacking at night.  She was able to lose 11 pounds from the beginning of the year.  At that time, she was also exercising 5 times a week at the gym early in the morning, but stopped with a coronavirus pandemic since the gyms were closed.  However, she restarted walking and now also swimming.  She does a great job with this.  On Sunday, she walked 12 months!  Office Visit on 09/05/2019  Component Date Value Ref Range Status  . Fructosamine 09/05/2019 328* 205 - 285 umol/L Final   HbA1c calculated from the fructosamine is higher, at 7.2%.  [name], MD PhD [facility]

## 2019-09-07 LAB — FRUCTOSAMINE: Fructosamine: 328 umol/L — ABNORMAL HIGH (ref 205–285)

## 2019-09-08 ENCOUNTER — Encounter: Payer: Self-pay | Admitting: Internal Medicine

## 2019-09-08 NOTE — Progress Notes (Signed)
Received labs from PCP, drawn 08/31/2019: BMP normal with the exception of a high glucose at 207.  BUN/creatinine 18/0.86, GFR 80 CBC normal HbA1c 8.5% Hep C virus antibody undetectable

## 2019-09-13 ENCOUNTER — Telehealth: Payer: Self-pay

## 2019-09-13 NOTE — Telephone Encounter (Signed)
-----   Message from [name], MD sent at 09/08/2019 12:01 PM EDT ----- Leah Robinson, can you please call pt: HbA1c calculated from fructosamine is higher, at 7.2%.

## 2019-09-14 NOTE — Telephone Encounter (Signed)
Notified patient of message from Dr. Gherghe, patient expressed understanding and agreement. No further questions.  

## 2019-10-25 DIAGNOSIS — Z91128 Patient's intentional underdosing of medication regimen for other reason: Secondary | ICD-10-CM | POA: Diagnosis not present

## 2019-10-25 DIAGNOSIS — M19071 Primary osteoarthritis, right ankle and foot: Secondary | ICD-10-CM | POA: Diagnosis not present

## 2019-10-25 DIAGNOSIS — Z6835 Body mass index (BMI) 35.0-35.9, adult: Secondary | ICD-10-CM | POA: Diagnosis not present

## 2019-10-25 DIAGNOSIS — T383X6D Underdosing of insulin and oral hypoglycemic [antidiabetic] drugs, subsequent encounter: Secondary | ICD-10-CM | POA: Diagnosis not present

## 2019-10-25 DIAGNOSIS — E1165 Type 2 diabetes mellitus with hyperglycemia: Secondary | ICD-10-CM | POA: Diagnosis not present

## 2019-10-25 DIAGNOSIS — M19072 Primary osteoarthritis, left ankle and foot: Secondary | ICD-10-CM | POA: Diagnosis not present

## 2019-10-27 ENCOUNTER — Ambulatory Visit: Payer: Medicare PPO | Admitting: Podiatry

## 2019-11-22 ENCOUNTER — Ambulatory Visit (INDEPENDENT_AMBULATORY_CARE_PROVIDER_SITE_OTHER): Payer: Medicare PPO | Admitting: Nurse Practitioner

## 2019-11-22 ENCOUNTER — Other Ambulatory Visit: Payer: Self-pay

## 2019-11-22 ENCOUNTER — Encounter: Payer: Self-pay | Admitting: Nurse Practitioner

## 2019-11-22 VITALS — BP 126/70 | HR 65 | Temp 98.2°F | Ht 64.0 in | Wt 213.8 lb

## 2019-11-22 DIAGNOSIS — Z833 Family history of diabetes mellitus: Secondary | ICD-10-CM

## 2019-11-22 DIAGNOSIS — R6889 Other general symptoms and signs: Secondary | ICD-10-CM | POA: Diagnosis not present

## 2019-11-22 MED ORDER — ASPIRIN EC 81 MG PO TBEC: 81.0000 mg | DELAYED_RELEASE_TABLET | Freq: Every day | ORAL | 2 refills | Status: DC

## 2019-11-22 MED ORDER — ASPIRIN 81 MG PO CHEW: 81.0000 mg | CHEWABLE_TABLET | Freq: Every day | ORAL | 1 refills | Status: AC

## 2019-11-22 NOTE — Progress Notes (Signed)
This visit occurred during the SARS-CoV-2 public health emergency.  Safety protocols were in place, including screening questions prior to the visit, additional usage of staff PPE, and extensive cleaning of exam room while observing appropriate contact time as indicated for disinfecting solutions.  Subjective:     Patient ID: Leah Robinson , female    DOB: 07/26/1951 , 68 y.o.   MRN: [id number]   Chief Complaint  Patient presents with  . PAD Evaluation    HPI  She is here today to be evaluated for PAD after having a home visit with decreased blood flow to bilateral feet with right worse than left. She will have at night because her feet are cold.  No changes in skin color.  Denies numbness or tingling.  Denies history of smoking.  Her uncle had a history of diabetes and had both limbs amputated.    No past medical history on file.   Family History  Problem Relation Age of Onset  . Breast cancer Neg Hx      Current Outpatient Medications:  .  ACCU-CHEK FASTCLIX LANCETS MISC, Use to check sugar once daily, Disp: 102 each, Rfl: 3 .  Blood Glucose Monitoring Suppl (ACCU-CHEK GUIDE) w/Device KIT, 1 Device by Does not apply route daily., Disp: 1 kit, Rfl: 0 .  cholecalciferol (VITAMIN D) 1000 UNITS tablet, Take 1,000 Units by mouth daily., Disp: , Rfl:  .  glucose blood (ACCU-CHEK GUIDE) test strip, Use as instructed to check sugar once daily, Disp: 100 each, Rfl: 3 .  metFORMIN (GLUCOPHAGE-XR) 500 MG 24 hr tablet, Take 2 tablets (1,000 mg total) by mouth daily with breakfast., Disp: 60 tablet, Rfl: 5 .  Multiple Vitamin (MULTIVITAMIN WITH MINERALS) TABS tablet, Take 1 tablet by mouth daily., Disp: , Rfl:    No Known Allergies   Review of Systems  Constitutional: Negative.   Respiratory: Negative.   Cardiovascular: Negative.   Musculoskeletal: Negative.   Neurological: Negative for dizziness and headaches.  Psychiatric/Behavioral: Negative.      Today's Vitals   11/22/19 1554   BP: 126/70  Pulse: 65  Temp: 98.2 F (36.8 C)  TempSrc: Oral  Weight: 213 lb 12.8 oz (97 kg)  Height: 5' 4" (1.626 m)  PainSc: 0-No pain   Body mass index is 36.7 kg/m.   Objective:  Physical Exam Vitals signs reviewed.  Constitutional:      General: She is not in acute distress.    Appearance: Normal appearance.  Cardiovascular:     Rate and Rhythm: Normal rate and regular rhythm.     Pulses: Normal pulses.     Heart sounds: Normal heart sounds. No murmur.  Pulmonary:     Effort: Pulmonary effort is normal. No respiratory distress.     Breath sounds: Normal breath sounds.  Musculoskeletal:     Left lower leg: No edema.  Skin:    General: Skin is warm.     Capillary Refill: Capillary refill takes less than 2 seconds.  Neurological:     General: No focal deficit present.     Mental Status: She is alert and oriented to person, place, and time.         Assessment And Plan:     1. Abnormal ankle brachial index (ABI)  Will send for further evaluation of her decreased ABI from the home health NP  No abnormalities found on physical exam  She is encouraged to take ASA 81 mg daily - aspirin (ASPIRIN ADULT LOW STRENGTH) 81   MG chewable tablet; Chew 1 tablet (81 mg total) by mouth daily.  Dispense: 30 tablet; Refill: 1 - Ambulatory referral to Vascular Surgery   Janece Moore, FNP    THE PATIENT IS ENCOURAGED TO PRACTICE SOCIAL DISTANCING DUE TO THE COVID-19 PANDEMIC.   

## 2019-11-24 ENCOUNTER — Other Ambulatory Visit: Payer: Self-pay | Admitting: Nurse Practitioner

## 2019-11-24 ENCOUNTER — Other Ambulatory Visit: Payer: Self-pay

## 2019-11-24 ENCOUNTER — Ambulatory Visit: Payer: Medicare PPO | Admitting: Podiatry

## 2019-11-24 DIAGNOSIS — M778 Other enthesopathies, not elsewhere classified: Secondary | ICD-10-CM

## 2019-11-26 NOTE — Progress Notes (Signed)
Leah Robinson presents today for follow-up of her bilateral capsulitis and osteoarthritis.  She states the pain is about the same of the right foot the left is better pain is about a 4 out of 10 sharp constant pains in the right.  Patient denies swelling numbness tingling she is tried Aleve and states that the shot helped last time.  Objective: Vital signs are stable alert and oriented x3.  Pulses are palpable.  Neurologic sensorium is intact she has pain across the dorsal aspect of the foot right over left.  Nodular masses dorsal aspect of the foot consistent with osteoarthritic changes of the TMT joints.  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  Plan: Discussed etiology pathology conservative or surgical therapies at this point I injected the right dorsal foot along the TMT line of joints with 10 mg of Kenalog 5 mg Marcaine she tolerated procedure well I will follow-up with her in 3 months if necessary.

## 2019-11-30 ENCOUNTER — Encounter: Payer: Self-pay | Admitting: Nurse Practitioner

## 2019-12-19 ENCOUNTER — Encounter: Payer: Self-pay | Admitting: Nurse Practitioner

## 2020-01-03 ENCOUNTER — Ambulatory Visit: Payer: Medicare PPO | Admitting: Internal Medicine

## 2020-01-09 ENCOUNTER — Telehealth (HOSPITAL_COMMUNITY): Payer: Self-pay

## 2020-01-09 ENCOUNTER — Other Ambulatory Visit: Payer: Self-pay

## 2020-01-09 DIAGNOSIS — I739 Peripheral vascular disease, unspecified: Secondary | ICD-10-CM

## 2020-01-09 NOTE — Telephone Encounter (Signed)

## 2020-01-10 ENCOUNTER — Ambulatory Visit (INDEPENDENT_AMBULATORY_CARE_PROVIDER_SITE_OTHER): Payer: Medicare PPO | Admitting: Vascular Surgery

## 2020-01-10 ENCOUNTER — Ambulatory Visit (HOSPITAL_COMMUNITY)
Admission: RE | Admit: 2020-01-10 | Discharge: 2020-01-10 | Disposition: A | Discharge status: Home / Self Care | Payer: Medicare PPO | Source: Ambulatory Visit | Attending: Surgery | Admitting: Surgery

## 2020-01-10 ENCOUNTER — Other Ambulatory Visit: Payer: Self-pay

## 2020-01-10 ENCOUNTER — Encounter: Payer: Self-pay | Admitting: Vascular Surgery

## 2020-01-10 VITALS — BP 151/69 | HR 55 | Temp 97.6°F | Resp 20 | Ht 64.0 in | Wt 209.0 lb

## 2020-01-10 DIAGNOSIS — R948 Abnormal results of function studies of other organs and systems: Secondary | ICD-10-CM

## 2020-01-10 DIAGNOSIS — R6889 Other general symptoms and signs: Secondary | ICD-10-CM

## 2020-01-10 DIAGNOSIS — I739 Peripheral vascular disease, unspecified: Secondary | ICD-10-CM | POA: Diagnosis not present

## 2020-01-10 NOTE — Progress Notes (Signed)
[facility] [place]  Patient name: Leah Robinson MRN: [id number] DOB: 07/29/1951 Sex: female  REASON FOR CONSULT: Discuss abnormality peripheral vascular screening study  HPI: Leah Robinson is a 69 y.o. female, who is here today for discussion of abnormal screening study.  She underwent lower extremity screening through her insurance company.  This was felt to be abnormal and is here today for further discussion.  She is extremely active.  She exercises at least 6 days a week.  Is very active in swimming exercises.  She has no claudication type symptoms.  She does have some arthritic changes in her lower extremity.  No history of cardiac disease and no history of stroke  Past Medical History:  Diagnosis Date  . Arthritis   . Diabetes mellitus without complication ([place])     Family History  Problem Relation Age of Onset  . Breast cancer Neg Hx     SOCIAL HISTORY: Social History   Socioeconomic History  . Marital status: Married    Spouse name: Not on file  . Number of children: Not on file  . Years of education: Not on file  . Highest education level: Not on file  Occupational History  . Occupation: retired  . Occupation: part time work  Tobacco Use  . Smoking status: Former Research scientist (life sciences)  . Smokeless tobacco: Never Used  Substance and Sexual Activity  . Alcohol use: No  . Drug use: No  . Sexual activity: Not Currently  Other Topics Concern  . Not on file  Social History Narrative  . Not on file   Social Determinants of Health   Financial Resource Strain: Low Risk   . Difficulty of Paying Living Expenses: Not hard at all  Food Insecurity: No Food Insecurity  . Worried About Charity fundraiser in the Last Year: Never true  . Ran Out of Food in the Last Year: Never true  Transportation Needs: No Transportation Needs  . Lack of Transportation (Medical): No  . Lack of Transportation (Non-Medical): No  Physical  Activity: Sufficiently Active  . Days of Exercise per Week: 6 days  . Minutes of Exercise per Session: 120 min  Stress: No Stress Concern Present  . Feeling of Stress : Not at all  Social Connections:   . Frequency of Communication with Friends and Family: Not on file  . Frequency of Social Gatherings with Friends and Family: Not on file  . Attends Religious Services: Not on file  . Active Member of Clubs or Organizations: Not on file  . Attends Archivist Meetings: Not on file  . Marital Status: Not on file  Intimate Partner Violence: Not At Risk  . Fear of Current or Ex-Partner: No  . Emotionally Abused: No  . Physically Abused: No  . Sexually Abused: No    No Known Allergies  Current Outpatient Medications  Medication Sig Dispense Refill  . ACCU-CHEK FASTCLIX LANCETS MISC Use to check sugar once daily 102 each 3  . aspirin (ASPIRIN ADULT LOW STRENGTH) 81 MG chewable tablet Chew 1 tablet (81 mg total) by mouth daily. 30 tablet 1  . Blood Glucose Monitoring Suppl (ACCU-CHEK GUIDE) w/Device KIT 1 Device by Does not apply route daily. 1 kit 0  . cholecalciferol (VITAMIN D) 1000 UNITS tablet Take 1,000 Units by mouth daily.    Marland Kitchen glucose blood (ACCU-CHEK GUIDE) test strip Use as instructed to check sugar once daily 100 each 3  . Multiple Vitamin (  MULTIVITAMIN WITH MINERALS) TABS tablet Take 1 tablet by mouth daily.    . metFORMIN (GLUCOPHAGE-XR) 500 MG 24 hr tablet Take 2 tablets (1,000 mg total) by mouth daily with breakfast. (Patient not taking: Reported on 01/10/2020) 60 tablet 5   No current facility-administered medications for this visit.    REVIEW OF SYSTEMS:  '[X]'$  denotes positive finding, '[ ]'$  denotes negative finding Cardiac  Comments:  Chest pain or chest pressure:    Shortness of breath upon exertion:    Short of breath when lying flat:    Irregular heart rhythm:        Vascular    Pain in calf, thigh, or hip brought on by ambulation:    Pain in feet at  night that wakes you up from your sleep:     Blood clot in your veins:    Leg swelling:         Pulmonary    Oxygen at home:    Productive cough:     Wheezing:         Neurologic    Sudden weakness in arms or legs:     Sudden numbness in arms or legs:     Sudden onset of difficulty speaking or slurred speech:    Temporary loss of vision in one eye:     Problems with dizziness:         Gastrointestinal    Blood in stool:     Vomited blood:         Genitourinary    Burning when urinating:     Blood in urine:        Psychiatric    Major depression:         Hematologic    Bleeding problems:    Problems with blood clotting too easily:        Skin    Rashes or ulcers:        Constitutional    Fever or chills:      PHYSICAL EXAM: Vitals:   01/10/20 1018  BP: (!) 151/69  Pulse: (!) 55  Resp: 20  Temp: 97.6 F (36.4 C)  SpO2: 99%  Weight: 209 lb (94.8 kg)  Height: '5\' 4"'$  (1.626 m)    GENERAL: The patient is a well-nourished female, in no acute distress. The vital signs are documented above. CARDIOVASCULAR: Carotid arteries without bruits bilaterally.  2+ radial 2+ femoral 2+ popliteal and 2+ dorsalis pedis pulses bilaterally PULMONARY: There is good air exchange  ABDOMEN: Soft and non-tender  MUSCULOSKELETAL: There are no major deformities or cyanosis. NEUROLOGIC: No focal weakness or paresthesias are detected. SKIN: There are no ulcers or rashes noted. PSYCHIATRIC: The patient has a normal affect.  DATA:  She underwent noninvasive studies in our office today.  This reveals normal ankle arm index bilaterally and normal triphasic waveforms at the dorsalis pedis and posterior tibial level bilaterally.  MEDICAL ISSUES: I had a long discussion with the patient.  Explained that she has had a false positive screening study.  She has completely normal lower extremity flow.  Would not recommend any further evaluation.  She was reassured with this discussion and will see  [place] again as needed   Rosetta Posner, MD [facility] Tel [phone] Pager [phone]

## 2020-01-18 ENCOUNTER — Ambulatory Visit: Payer: Medicare PPO | Admitting: Nurse Practitioner

## 2020-01-18 ENCOUNTER — Other Ambulatory Visit: Payer: Self-pay

## 2020-01-18 ENCOUNTER — Encounter: Payer: Self-pay | Admitting: Nurse Practitioner

## 2020-01-18 VITALS — BP 124/76 | HR 58 | Temp 98.4°F | Ht 64.0 in | Wt 211.2 lb

## 2020-01-18 DIAGNOSIS — R1031 Right lower quadrant pain: Secondary | ICD-10-CM | POA: Diagnosis not present

## 2020-01-18 NOTE — Progress Notes (Signed)
This visit occurred during the SARS-CoV-2 public health emergency.  Safety protocols were in place, including screening questions prior to the visit, additional usage of staff PPE, and extensive cleaning of exam room while observing appropriate contact time as indicated for disinfecting solutions.  Subjective:     Patient ID: Leah Robinson , female    DOB: 03/24/1951 , 69 y.o.   MRN: [id number]   Chief Complaint  Patient presents with  . Groin Pain    patient stated when she gets up she gets a sharp pain on the right side. she stated the pain mainly comes when she is sitting she is unsure if she strained her muscle while exercising in the pool    HPI  Groin Pain The patient's primary symptoms include pelvic pain. This is a new problem. The current episode started in the past 7 days (Friday night). The problem occurs intermittently. The problem has been unchanged. The problem affects the right side. She is not pregnant. Pertinent negatives include no abdominal pain, constipation or headaches. She has tried NSAIDs for the symptoms. The treatment provided moderate relief. Contraceptive use: she has both ovaries  She is postmenopausal.     Past Medical History:  Diagnosis Date  . Arthritis   . Diabetes mellitus without complication ([place])      Family History  Problem Relation Age of Onset  . Breast cancer Neg Hx      Current Outpatient Medications:  .  ACCU-CHEK FASTCLIX LANCETS MISC, Use to check sugar once daily, Disp: 102 each, Rfl: 3 .  aspirin (ASPIRIN ADULT LOW STRENGTH) 81 MG chewable tablet, Chew 1 tablet (81 mg total) by mouth daily., Disp: 30 tablet, Rfl: 1 .  Blood Glucose Monitoring Suppl (ACCU-CHEK GUIDE) w/Device KIT, 1 Device by Does not apply route daily., Disp: 1 kit, Rfl: 0 .  cholecalciferol (VITAMIN D) 1000 UNITS tablet, Take 1,000 Units by mouth daily., Disp: , Rfl:  .  glucose blood (ACCU-CHEK GUIDE) test strip, Use as instructed to check sugar once daily, Disp:  100 each, Rfl: 3 .  Multiple Vitamin (MULTIVITAMIN WITH MINERALS) TABS tablet, Take 1 tablet by mouth daily., Disp: , Rfl:  .  metFORMIN (GLUCOPHAGE-XR) 500 MG 24 hr tablet, Take 2 tablets (1,000 mg total) by mouth daily with breakfast. (Patient not taking: Reported on 01/10/2020), Disp: 60 tablet, Rfl: 5   No Known Allergies   Review of Systems  Constitutional: Negative.   Respiratory: Negative.   Cardiovascular: Negative.  Negative for chest pain, palpitations and leg swelling.  Gastrointestinal: Negative for abdominal pain and constipation.  Genitourinary: Positive for pelvic pain.  Musculoskeletal:       Right groin pain   Neurological: Negative for dizziness and headaches.  Psychiatric/Behavioral: Negative.      Today's Vitals   01/18/20 1203  BP: 124/76  Pulse: (!) 58  Temp: 98.4 F (36.9 C)  TempSrc: Oral  Weight: 211 lb 3.2 oz (95.8 kg)  Height: _0  (1.626 m)  PainSc: 0-No pain   Body mass index is 36.25 kg/m.   Objective:  Physical Exam Constitutional:      General: She is not in acute distress.    Appearance: Normal appearance. She is obese.  Cardiovascular:     Rate and Rhythm: Normal rate and regular rhythm.     Pulses: Normal pulses.     Heart sounds: Normal heart sounds. No murmur.  Pulmonary:     Effort: Pulmonary effort is normal. No respiratory distress.  Breath sounds: Normal breath sounds.  Abdominal:     Comments: Right groin area sharp pain  Skin:    Capillary Refill: Capillary refill takes less than 2 seconds.  Neurological:     General: No focal deficit present.     Mental Status: She is alert and oriented to person, place, and time.  Psychiatric:        Mood and Affect: Mood normal.        Behavior: Behavior normal.        Thought Content: Thought content normal.        Judgment: Judgment normal.         Assessment And Plan:     1. Discomfort of right groin  Will give her hip stretches exercises and back exercises  If not  better call to office for possible ultrasound    Minette Brine, FNP    THE PATIENT IS ENCOURAGED TO PRACTICE SOCIAL DISTANCING DUE TO THE COVID-19 PANDEMIC.

## 2020-01-18 NOTE — Patient Instructions (Addendum)
Hip Exercises Ask your health care provider which exercises are safe for you. Do exercises exactly as told by your health care provider and adjust them as directed. It is normal to feel mild stretching, pulling, tightness, or discomfort as you do these exercises. Stop right away if you feel sudden pain or your pain gets worse. Do not begin these exercises until told by your health care provider. Stretching and range-of-motion exercises These exercises warm up your muscles and joints and improve the movement and flexibility of your hip. These exercises also help to relieve pain, numbness, and tingling. You may be asked to limit your range of motion if you had a hip replacement. Talk to your health care provider about these restrictions. Hamstrings, supine  1. Lie on your back (supine position). 2. Loop a belt or towel over the ball of your left / right foot. The ball of your foot is on the walking surface, right under your toes. 3. Straighten your left / right knee and slowly pull on the belt or towel to raise your leg until you feel a gentle stretch behind your knee (hamstring). ? Do not let your knee bend while you do this. ? Keep your other leg flat on the floor. 4. Hold this position for __________ seconds. 5. Slowly return your leg to the starting position. Repeat __________ times. Complete this exercise __________ times a day. Hip rotation  1. Lie on your back on a firm surface. 2. With your left / right hand, gently pull your left / right knee toward the shoulder that is on the same side of the body. Stop when your knee is pointing toward the ceiling. 3. Hold your left / right ankle with your other hand. 4. Keeping your knee steady, gently pull your left / right ankle toward your other shoulder until you feel a stretch in your buttocks. ? Keep your hips and shoulders firmly planted while you do this stretch. 5. Hold this position for __________ seconds. Repeat __________ times. Complete  this exercise __________ times a day. Seated stretch This exercise is sometimes called hamstrings and adductors stretch. 1. Sit on the floor with your legs stretched wide. Keep your knees straight during this exercise. 2. Keeping your head and back in a straight line, bend at your waist to reach for your left foot (position A). You should feel a stretch in your right inner thigh (adductors). 3. Hold this position for __________ seconds. Then slowly return to the upright position. 4. Keeping your head and back in a straight line, bend at your waist to reach forward (position B). You should feel a stretch behind both of your thighs and knees (hamstrings). 5. Hold this position for __________ seconds. Then slowly return to the upright position. 6. Keeping your head and back in a straight line, bend at your waist to reach for your right foot (position C). You should feel a stretch in your left inner thigh (adductors). 7. Hold this position for __________ seconds. Then slowly return to the upright position. Repeat __________ times. Complete this exercise __________ times a day. Lunge This exercise stretches the muscles of the hip (hip flexors). 1. Place your left / right knee on the floor and bend your other knee so that is directly over your ankle. You should be half-kneeling. 2. Keep good posture with your head over your shoulders. 3. Tighten your buttocks to point your tailbone downward. This will prevent your back from arching too much. 4. You should feel a  gentle stretch in the front of your left / right thigh and hip. If you do not feel a stretch, slide your other foot forward slightly and then slowly lunge forward with your chest up until your knee once again lines up over your ankle. ? Make sure your tailbone continues to point downward. 5. Hold this position for __________ seconds. 6. Slowly return to the starting position. Repeat __________ times. Complete this exercise __________ times a  day. Strengthening exercises These exercises build strength and endurance in your hip. Endurance is the ability to use your muscles for a long time, even after they get tired. Bridge This exercise strengthens the muscles of your hip (hip extensors). 1. Lie on your back on a firm surface with your knees bent and your feet flat on the floor. 2. Tighten your buttocks muscles and lift your bottom off the floor until the trunk of your body and your hips are level with your thighs. ? Do not arch your back. ? You should feel the muscles working in your buttocks and the back of your thighs. If you do not feel these muscles, slide your feet 1-2 inches (2.5-5 cm) farther away from your buttocks. 3. Hold this position for __________ seconds. 4. Slowly lower your hips to the starting position. 5. Let your muscles relax completely between repetitions. Repeat __________ times. Complete this exercise __________ times a day. Straight leg raises, side-lying This exercise strengthens the muscles that move the hip joint away from the center of the body (hip abductors). 1. Lie on your side with your left / right leg in the top position. Lie so your head, shoulder, hip, and knee line up. You may bend your bottom knee slightly to help you balance. 2. Roll your hips slightly forward, so your hips are stacked directly over each other and your left / right knee is facing forward. 3. Leading with your heel, lift your top leg 4-6 inches (10-15 cm). You should feel the muscles in your top hip lifting. ? Do not let your foot drift forward. ? Do not let your knee roll toward the ceiling. 4. Hold this position for __________ seconds. 5. Slowly return to the starting position. 6. Let your muscles relax completely between repetitions. Repeat __________ times. Complete this exercise __________ times a day. Straight leg raises, side-lying This exercise strengthens the muscles that move the hip joint toward the center of the  body (hip adductors). 1. Lie on your side with your left / right leg in the bottom position. Lie so your head, shoulder, hip, and knee line up. You may place your upper foot in front to help you balance. 2. Roll your hips slightly forward, so your hips are stacked directly over each other and your left / right knee is facing forward. 3. Tense the muscles in your inner thigh and lift your bottom leg 4-6 inches (10-15 cm). 4. Hold this position for __________ seconds. 5. Slowly return to the starting position. 6. Let your muscles relax completely between repetitions. Repeat __________ times. Complete this exercise __________ times a day. Straight leg raises, supine This exercise strengthens the muscles in the front of your thigh (quadriceps). 1. Lie on your back (supine position) with your left / right leg extended and your other knee bent. 2. Tense the muscles in the front of your left / right thigh. You should see your kneecap slide up or see increased dimpling just above your knee. 3. Keep these muscles tight as you raise your   leg 4-6 inches (10-15 cm) off the floor. Do not let your knee bend. 4. Hold this position for __________ seconds. 5. Keep these muscles tense as you lower your leg. 6. Relax the muscles slowly and completely between repetitions. Repeat __________ times. Complete this exercise __________ times a day. Hip abductors, standing This exercise strengthens the muscles that move the leg and hip joint away from the center of the body (hip abductors). 1. Tie one end of a rubber exercise band or tubing to a secure surface, such as a chair, table, or pole. 2. Loop the other end of the band or tubing around your left / right ankle. 3. Keeping your ankle with the band or tubing directly opposite the secured end, step away until there is tension in the tubing or band. Hold on to a chair, table, or pole as needed for balance. 4. Lift your left / right leg out to your side. While you do  this: ? Keep your back upright. ? Keep your shoulders over your hips. ? Keep your toes pointing forward. ? Make sure to use your hip muscles to slowly lift your leg. Do not tip your body or forcefully lift your leg. 5. Hold this position for __________ seconds. 6. Slowly return to the starting position. Repeat __________ times. Complete this exercise __________ times a day. Squats This exercise strengthens the muscles in the front of your thigh (quadriceps). 1. Stand in a door frame so your feet and knees are in line with the frame. You may place your hands on the frame for balance. 2. Slowly bend your knees and lower your hips like you are going to sit in a chair. ? Keep your lower legs in a straight-up-and-down position. ? Do not let your hips go lower than your knees. ? Do not bend your knees lower than told by your health care provider. ? If your hip pain increases, do not bend as low. 3. Hold this position for ___________ seconds. 4. Slowly push with your legs to return to standing. Do not use your hands to pull yourself to standing. Repeat __________ times. Complete this exercise __________ times a day. This information is not intended to replace advice given to you by your health care provider. Make sure you discuss any questions you have with your health care provider. Document Revised: 07/14/2019 Document Reviewed: 10/19/2018 Elsevier Patient Education  [place].   Back Exercises These exercises help to make your trunk and back strong. They also help to keep the lower back flexible. Doing these exercises can help to prevent back pain or lessen existing pain.  If you have back pain, try to do these exercises 2-3 times each day or as told by your doctor.  As you get better, do the exercises once each day. Repeat the exercises more often as told by your doctor.  To stop back pain from coming back, do the exercises once each day, or as told by your  doctor. Exercises Single knee to chest Do these steps 3-5 times in a row for each leg: 6. Lie on your back on a firm bed or the floor with your legs stretched out. 7. Bring one knee to your chest. 8. Grab your knee or thigh with both hands and hold them it in place. 9. Pull on your knee until you feel a gentle stretch in your lower back or buttocks. 10. Keep doing the stretch for 10-30 seconds. 11. Slowly let go of your leg and straighten  it. Pelvic tilt Do these steps 5-10 times in a row: 8. Lie on your back on a firm bed or the floor with your legs stretched out. 9. Bend your knees so they point up to the ceiling. Your feet should be flat on the floor. 10. Tighten your lower belly (abdomen) muscles to press your lower back against the floor. This will make your tailbone point up to the ceiling instead of pointing down to your feet or the floor. 11. Stay in this position for 5-10 seconds while you gently tighten your muscles and breathe evenly. Cat-cow Do these steps until your lower back bends more easily: 7. Get on your hands and knees on a firm surface. Keep your hands under your shoulders, and keep your knees under your hips. You may put padding under your knees. 8. Let your head hang down toward your chest. Tighten (contract) the muscles in your belly. Point your tailbone toward the floor so your lower back becomes rounded like the back of a cat. 9. Stay in this position for 5 seconds. 10. Slowly lift your head. Let the muscles of your belly relax. Point your tailbone up toward the ceiling so your back forms a sagging arch like the back of a cow. 11. Stay in this position for 5 seconds.  Press-ups Do these steps 5-10 times in a row: 6. Lie on your belly (face-down) on the floor. 7. Place your hands near your head, about shoulder-width apart. 8. While you keep your back relaxed and keep your hips on the floor, slowly straighten your arms to raise the top half of your body and lift  your shoulders. Do not use your back muscles. You may change where you place your hands in order to make yourself more comfortable. 9. Stay in this position for 5 seconds. 10. Slowly return to lying flat on the floor.  Bridges Do these steps 10 times in a row: 7. Lie on your back on a firm surface. 8. Bend your knees so they point up to the ceiling. Your feet should be flat on the floor. Your arms should be flat at your sides, next to your body. 9. Tighten your butt muscles and lift your butt off the floor until your waist is almost as high as your knees. If you do not feel the muscles working in your butt and the back of your thighs, slide your feet 1-2 inches farther away from your butt. 10. Stay in this position for 3-5 seconds. 11. Slowly lower your butt to the floor, and let your butt muscles relax. If this exercise is too easy, try doing it with your arms crossed over your chest. Belly crunches Do these steps 5-10 times in a row: 7. Lie on your back on a firm bed or the floor with your legs stretched out. 8. Bend your knees so they point up to the ceiling. Your feet should be flat on the floor. 9. Cross your arms over your chest. 10. Tip your chin a little bit toward your chest but do not bend your neck. 65. Tighten your belly muscles and slowly raise your chest just enough to lift your shoulder blades a tiny bit off of the floor. Avoid raising your body higher than that, because it can put too much stress on your low back. 12. Slowly lower your chest and your head to the floor. Back lifts Do these steps 5-10 times in a row: 7. Lie on your belly (face-down) with your arms at  your sides, and rest your forehead on the floor. 8. Tighten the muscles in your legs and your butt. 9. Slowly lift your chest off of the floor while you keep your hips on the floor. Keep the back of your head in line with the curve in your back. Look at the floor while you do this. 10. Stay in this position for 3-5  seconds. 11. Slowly lower your chest and your face to the floor. Contact a doctor if:  Your back pain gets a lot worse when you do an exercise.  Your back pain does not get better 2 hours after you exercise. If you have any of these problems, stop doing the exercises. Do not do them again unless your doctor says it is okay. Get help right away if:  You have sudden, very bad back pain. If this happens, stop doing the exercises. Do not do them again unless your doctor says it is okay. This information is not intended to replace advice given to you by your health care provider. Make sure you discuss any questions you have with your health care provider. Document Revised: 09/02/2018 Document Reviewed: 09/02/2018 Elsevier Patient Education  2020 [employer].

## 2020-01-29 ENCOUNTER — Ambulatory Visit: Payer: Medicare PPO

## 2020-02-08 ENCOUNTER — Ambulatory Visit: Payer: Medicare PPO | Admitting: Critical Care Medicine

## 2020-02-08 ENCOUNTER — Other Ambulatory Visit: Payer: Self-pay

## 2020-02-08 VITALS — BP 143/70 | HR 76 | Temp 97.5°F | Resp 18 | Ht 65.0 in

## 2020-02-08 DIAGNOSIS — E1165 Type 2 diabetes mellitus with hyperglycemia: Secondary | ICD-10-CM | POA: Insufficient documentation

## 2020-02-08 DIAGNOSIS — R1032 Left lower quadrant pain: Secondary | ICD-10-CM

## 2020-02-08 DIAGNOSIS — E119 Type 2 diabetes mellitus without complications: Secondary | ICD-10-CM | POA: Diagnosis not present

## 2020-02-08 HISTORY — DX: Left lower quadrant pain: R10.32

## 2020-02-08 LAB — POCT GLYCOSYLATED HEMOGLOBIN (HGB A1C): Hemoglobin A1C: 7.2 % — AB (ref 4.0–5.6)

## 2020-02-08 LAB — GLUCOSE, POCT (MANUAL RESULT ENTRY): POC Glucose: 191 mg/dl — AB (ref 70–99)

## 2020-02-08 NOTE — Assessment & Plan Note (Signed)
Left lower quadrant abdominal pain which is concerning to be more intra-abdominal than musculoskeletal in nature and not fully resolved with hip stretching exercises  Plan will be to obtain a complete abdominal ultrasound and will share results with patient's primary care provider

## 2020-02-08 NOTE — Assessment & Plan Note (Addendum)
Type 2 diabetes without use of long-term insulin now on Metformin alone with A1c now 7.2 down from a previous value of 8-1/2 in 2020.  Plan will be to repeat fructosamine levels to see if it correlates and as well to recheck complete metabolic panel and blood counts  I will not make medication changes at this time we will follow-up on lab data and share results with the patient's primary care provider and her endocrinologist  The patient is on a good exercise regimen however I reviewed a diabetic diet at this visit

## 2020-02-08 NOTE — Patient Instructions (Addendum)
No change in medications for now  An abdominal ultrasound will be obtained for your abdominal pain evaluation  Labs today included complete blood count and complete metabolic panel, fructosamine level  Keep your follow-up appointments with endocrinology and your primary care provider  We will send copies of lab reports to both of your physicians and we will call you with results  Follow a diabetic diet as outlined below and please keep yourself well-hydrated   Diabetes Mellitus and Nutrition, Adult When you have diabetes (diabetes mellitus), it is very important to have healthy eating habits because your blood sugar (glucose) levels are greatly affected by what you eat and drink. Eating healthy foods in the appropriate amounts, at about the same times every day, can help you:  Control your blood glucose.  Lower your risk of heart disease.  Improve your blood pressure.  Reach or maintain a healthy weight. Every person with diabetes is different, and each person has different needs for a meal plan. Your health care provider may recommend that you work with a diet and nutrition specialist (dietitian) to make a meal plan that is best for you. Your meal plan may vary depending on factors such as:  The calories you need.  The medicines you take.  Your weight.  Your blood glucose, blood pressure, and cholesterol levels.  Your activity level.  Other health conditions you have, such as heart or kidney disease. How do carbohydrates affect me? Carbohydrates, also called carbs, affect your blood glucose level more than any other type of food. Eating carbs naturally raises the amount of glucose in your blood. Carb counting is a method for keeping track of how many carbs you eat. Counting carbs is important to keep your blood glucose at a healthy level, especially if you use insulin or take certain oral diabetes medicines. It is important to know how many carbs you can safely have in each  meal. This is different for every person. Your dietitian can help you calculate how many carbs you should have at each meal and for each snack. Foods that contain carbs include:  Bread, cereal, rice, pasta, and crackers.  Potatoes and corn.  Peas, beans, and lentils.  Milk and yogurt.  Fruit and juice.  Desserts, such as cakes, cookies, ice cream, and candy. How does alcohol affect me? Alcohol can cause a sudden decrease in blood glucose (hypoglycemia), especially if you use insulin or take certain oral diabetes medicines. Hypoglycemia can be a life-threatening condition. Symptoms of hypoglycemia (sleepiness, dizziness, and confusion) are similar to symptoms of having too much alcohol. If your health care provider says that alcohol is safe for you, follow these guidelines:  Limit alcohol intake to no more than 1 drink per day for nonpregnant women and 2 drinks per day for men. One drink equals 12 oz of beer, 5 oz of wine, or 1 oz of hard liquor.  Do not drink on an empty stomach.  Keep yourself hydrated with water, diet soda, or unsweetened iced tea.  Keep in mind that regular soda, juice, and other mixers may contain a lot of sugar and must be counted as carbs. What are tips for following this plan?  Reading food labels  Start by checking the serving size on the "Nutrition Facts" label of packaged foods and drinks. The amount of calories, carbs, fats, and other nutrients listed on the label is based on one serving of the item. Many items contain more than one serving per package.  Check the  total grams (g) of carbs in one serving. You can calculate the number of servings of carbs in one serving by dividing the total carbs by 15. For example, if a food has 30 g of total carbs, it would be equal to 2 servings of carbs.  Check the number of grams (g) of saturated and trans fats in one serving. Choose foods that have low or no amount of these fats.  Check the number of milligrams  (mg) of salt (sodium) in one serving. Most people should limit total sodium intake to less than 2,300 mg per day.  Always check the nutrition information of foods labeled as "low-fat" or "nonfat". These foods may be higher in added sugar or refined carbs and should be avoided.  Talk to your dietitian to identify your daily goals for nutrients listed on the label. Shopping  Avoid buying canned, premade, or processed foods. These foods tend to be high in fat, sodium, and added sugar.  Shop around the outside edge of the grocery store. This includes fresh fruits and vegetables, bulk grains, fresh meats, and fresh dairy. Cooking  Use low-heat cooking methods, such as baking, instead of high-heat cooking methods like deep frying.  Cook using healthy oils, such as olive, canola, or sunflower oil.  Avoid cooking with butter, cream, or high-fat meats. Meal planning  Eat meals and snacks regularly, preferably at the same times every day. Avoid going long periods of time without eating.  Eat foods high in fiber, such as fresh fruits, vegetables, beans, and whole grains. Talk to your dietitian about how many servings of carbs you can eat at each meal.  Eat 4-6 ounces (oz) of lean protein each day, such as lean meat, chicken, fish, eggs, or tofu. One oz of lean protein is equal to: ? 1 oz of meat, chicken, or fish. ? 1 egg. ?  cup of tofu.  Eat some foods each day that contain healthy fats, such as avocado, nuts, seeds, and fish. Lifestyle  Check your blood glucose regularly.  Exercise regularly as told by your health care provider. This may include: ? 150 minutes of moderate-intensity or vigorous-intensity exercise each week. This could be brisk walking, biking, or water aerobics. ? Stretching and doing strength exercises, such as yoga or weightlifting, at least 2 times a week.  Take medicines as told by your health care provider.  Do not use any products that contain nicotine or  tobacco, such as cigarettes and e-cigarettes. If you need help quitting, ask your health care provider.  Work with a Social worker or diabetes educator to identify strategies to manage stress and any emotional and social challenges. Questions to ask a health care provider  Do I need to meet with a diabetes educator?  Do I need to meet with a dietitian?  What number can I call if I have questions?  When are the best times to check my blood glucose? Where to find more information:  American Diabetes Association: diabetes.org  Academy of Nutrition and Dietetics: www.eatright.[employer] of Diabetes and Digestive and Kidney Diseases (NIH): [employer] Summary  A healthy meal plan will help you control your blood glucose and maintain a healthy lifestyle.  Working with a diet and nutrition specialist (dietitian) can help you make a meal plan that is best for you.  Keep in mind that carbohydrates (carbs) and alcohol have immediate effects on your blood glucose levels. It is important to count carbs and to use alcohol carefully. This information is  not intended to replace advice given to you by your health care provider. Make sure you discuss any questions you have with your health care provider. Document Revised: 11/20/2017 Document Reviewed: 01/12/2017 Elsevier Patient Education  2020 [employer].

## 2020-02-08 NOTE — Progress Notes (Signed)
Subjective:    Patient ID: Leah Robinson, female    DOB: 18-Dec-1951, 69 y.o.   MRN: [id number]  This is a pleasant 69 year old female who comes to the mobile medicine clinic today for evaluation of left and right lower quadrant abdominal pain.  Patient has a history of obesity class I and controlled diabetes on Metformin.  She is also had borderline hypertension that is being observed with diet and exercise alone.  She is followed at [facility] for her primary care.  Patient gives a history of several weeks of right and then left lower inguinal and lower abdominal pain.  She was actually seen by her primary care provider on 27 January who prescribed hip and back exercises and she has been doing these it seemed to help a bit and does seem to relieve the right lower pain however the left lower quadrant pain continues.  The patient denies any urologic complaints including no dysuria urgency or frequency or change in color of urine.  The patient denies any GI complaints including no diarrhea constipation blood in the stool or rectal or anal pain.  Patient denies nausea or vomiting or reflux symptoms.  Patient's blood sugars have been in the 1 20-1 40 range last A1c was at 6.1 and she does take the Metformin daily  Patient also takes vitamin D supplementation and multivitamins along with aspirin daily  The pain is the abdomen is an aching pain and not sharp and stabbing.  It is worse with bending over.  Is not relieved with bowel movements.  Note she has had a hysterectomy but still has her ovaries.  Note in addition to the patient's primary care provider the patient also follows with endocrinology who has been assessing fructosamine levels as opposed A1c regards to her glycemic control  Past Medical History:  Diagnosis Date  . Arthritis   . Diabetes mellitus without complication ([place])      Family History  Problem Relation Age of Onset  . Breast cancer Neg Hx      Social History    Socioeconomic History  . Marital status: Married    Spouse name: Not on file  . Number of children: Not on file  . Years of education: Not on file  . Highest education level: Not on file  Occupational History  . Occupation: retired  . Occupation: part time work  Tobacco Use  . Smoking status: Former Research scientist (life sciences)  . Smokeless tobacco: Never Used  Substance and Sexual Activity  . Alcohol use: No  . Drug use: No  . Sexual activity: Not Currently  Other Topics Concern  . Not on file  Social History Narrative  . Not on file   Social Determinants of Health   Financial Resource Strain: Low Risk   . Difficulty of Paying Living Expenses: Not hard at all  Food Insecurity: No Food Insecurity  . Worried About Charity fundraiser in the Last Year: Never true  . Ran Out of Food in the Last Year: Never true  Transportation Needs: No Transportation Needs  . Lack of Transportation (Medical): No  . Lack of Transportation (Non-Medical): No  Physical Activity: Sufficiently Active  . Days of Exercise per Week: 6 days  . Minutes of Exercise per Session: 120 min  Stress: No Stress Concern Present  . Feeling of Stress : Not at all  Social Connections:   . Frequency of Communication with Friends and Family: Not on file  . Frequency of Social Gatherings  with Friends and Family: Not on file  . Attends Religious Services: Not on file  . Active Member of Clubs or Organizations: Not on file  . Attends Archivist Meetings: Not on file  . Marital Status: Not on file  Intimate Partner Violence: Not At Risk  . Fear of Current or Ex-Partner: No  . Emotionally Abused: No  . Physically Abused: No  . Sexually Abused: No     No Known Allergies   Outpatient Medications Prior to Visit  Medication Sig Dispense Refill  . ACCU-CHEK FASTCLIX LANCETS MISC Use to check sugar once daily 102 each 3  . aspirin (ASPIRIN ADULT LOW STRENGTH) 81 MG chewable tablet Chew 1 tablet (81 mg total) by mouth daily.  30 tablet 1  . Blood Glucose Monitoring Suppl (ACCU-CHEK GUIDE) w/Device KIT 1 Device by Does not apply route daily. 1 kit 0  . cholecalciferol (VITAMIN D) 1000 UNITS tablet Take 1,000 Units by mouth daily.    Marland Kitchen glucose blood (ACCU-CHEK GUIDE) test strip Use as instructed to check sugar once daily 100 each 3  . Multiple Vitamin (MULTIVITAMIN WITH MINERALS) TABS tablet Take 1 tablet by mouth daily.    . metFORMIN (GLUCOPHAGE-XR) 500 MG 24 hr tablet Take 2 tablets (1,000 mg total) by mouth daily with breakfast. (Patient not taking: Reported on 01/10/2020) 60 tablet 5   No facility-administered medications prior to visit.     Review of Systems  Constitutional: Negative.   HENT: Negative.   Eyes: Negative.   Respiratory: Negative for cough, choking, chest tightness, shortness of breath and wheezing.   Cardiovascular: Negative for chest pain, palpitations and leg swelling.  Gastrointestinal: Positive for abdominal pain. Negative for anal bleeding, blood in stool, constipation, diarrhea, nausea, rectal pain and vomiting.       LLQ abd pain  Genitourinary: Negative for difficulty urinating, dysuria, enuresis, flank pain, frequency, hematuria, pelvic pain, urgency, vaginal bleeding, vaginal discharge and vaginal pain.  Musculoskeletal: Negative.   Skin: Negative.   Neurological: Negative.   Hematological: Negative.   Psychiatric/Behavioral: Negative.        Objective:   Physical Exam Vitals:   02/08/20 0921  BP: (!) 143/70  Pulse: 76  Resp: 18  Temp: (!) 97.5 F (36.4 C)  TempSrc: Oral  SpO2: 100%  Height: '5\' 5"'$  (1.651 m)    Gen: Pleasant, well-nourished, in no distress,  normal affect  ENT: No lesions,  mouth clear,  oropharynx clear, no postnasal drip  Neck: No JVD, no TMG, no carotid bruits  Lungs: No use of accessory muscles, no dullness to percussion, clear without rales or rhonchi  Cardiovascular: RRR, heart sounds normal, no murmur or gallops, no peripheral  edema  Abdomen: soft  no HSM,  BS normal, tender in the left lower quadrant without rebound or guarding no masses are felt  Musculoskeletal: No deformities, no cyanosis or clubbing  Neuro: alert, non focal  Skin: Warm, no lesions or rashes  A1C 7.2   CBG 191     Assessment & Plan:  I personally reviewed all images and lab data in the [facility] system as well as any outside material available during this office visit and agree with the  radiology impressions.   Type 2 diabetes mellitus with hyperglycemia, without long-term current use of insulin (HCC) Type 2 diabetes without use of long-term insulin now on Metformin alone with A1c now 7.2 down from a previous value of 8-1/2 in 2020.  Plan will be to repeat fructosamine levels  to see if it correlates and as well to recheck complete metabolic panel and blood counts  I will not make medication changes at this time we will follow-up on lab data and share results with the patient's primary care provider and her endocrinologist  The patient is on a good exercise regimen however I reviewed a diabetic diet at this visit  LLQ abdominal pain Left lower quadrant abdominal pain which is concerning to be more intra-abdominal than musculoskeletal in nature and not fully resolved with hip stretching exercises  Plan will be to obtain a complete abdominal ultrasound and will share results with patient's primary care provider   Leah was seen today for flank pain.  Diagnoses and all orders for this visit:  LLQ abdominal pain -     US Abdomen Complete; Future -     CBC with Differential/Platelet; Future -     Comprehensive metabolic panel -     CBC with Differential/Platelet  Diet-controlled diabetes mellitus (HCC) -     HgB A1c -     Glucose (CBG) -     Comprehensive metabolic panel -     Fructosamine  Type 2 diabetes mellitus with hyperglycemia, without long-term current use of insulin ([place])

## 2020-02-08 NOTE — Progress Notes (Signed)
Patient complains of right side pain that subsided and then returned on the left side 2 weeks ago. Intermittent pain with squatting or bending.

## 2020-02-09 ENCOUNTER — Telehealth: Payer: Self-pay | Admitting: Critical Care Medicine

## 2020-02-09 LAB — FRUCTOSAMINE: Fructosamine: 291 umol/L — ABNORMAL HIGH (ref 0–285)

## 2020-02-09 LAB — CBC WITH DIFFERENTIAL/PLATELET
Basophils Absolute: 0.1 10*3/uL (ref 0.0–0.2)
Basos: 1 %
EOS (ABSOLUTE): 0.1 10*3/uL (ref 0.0–0.4)
Eos: 2 %
Hematocrit: 38.5 % (ref 34.0–46.6)
Hemoglobin: 12.9 g/dL (ref 11.1–15.9)
Immature Grans (Abs): 0 10*3/uL (ref 0.0–0.1)
Immature Granulocytes: 0 %
Lymphocytes Absolute: 1.6 10*3/uL (ref 0.7–3.1)
Lymphs: 30 %
MCH: 29.7 pg (ref 26.6–33.0)
MCHC: 33.5 g/dL (ref 31.5–35.7)
MCV: 89 fL (ref 79–97)
Monocytes Absolute: 0.3 10*3/uL (ref 0.1–0.9)
Monocytes: 6 %
Neutrophils Absolute: 3.2 10*3/uL (ref 1.4–7.0)
Neutrophils: 61 %
Platelets: 221 10*3/uL (ref 150–450)
RBC: 4.34 x10E6/uL (ref 3.77–5.28)
RDW: 12.1 % (ref 11.7–15.4)
WBC: 5.3 10*3/uL (ref 3.4–10.8)

## 2020-02-09 LAB — COMPREHENSIVE METABOLIC PANEL
ALT: 13 IU/L (ref 0–32)
AST: 14 IU/L (ref 0–40)
Albumin/Globulin Ratio: 1.2 (ref 1.2–2.2)
Albumin: 4 g/dL (ref 3.8–4.8)
Alkaline Phosphatase: 78 IU/L (ref 39–117)
BUN/Creatinine Ratio: 17 (ref 12–28)
BUN: 14 mg/dL (ref 8–27)
Bilirubin Total: 0.4 mg/dL (ref 0.0–1.2)
CO2: 23 mmol/L (ref 20–29)
Calcium: 9.5 mg/dL (ref 8.7–10.3)
Chloride: 105 mmol/L (ref 96–106)
Creatinine, Ser: 0.81 mg/dL (ref 0.57–1.00)
GFR calc Af Amer: 86 mL/min/{1.73_m2} (ref >59)
GFR calc non Af Amer: 75 mL/min/{1.73_m2} (ref >59)
Globulin, Total: 3.4 g/dL (ref 1.5–4.5)
Glucose: 200 mg/dL — ABNORMAL HIGH (ref 65–99)
Potassium: 4.3 mmol/L (ref 3.5–5.2)
Sodium: 141 mmol/L (ref 134–144)
Total Protein: 7.4 g/dL (ref 6.0–8.5)

## 2020-02-09 NOTE — Telephone Encounter (Signed)
I connected with this patient in follow-up of her labs her fructosamine level is slightly elevated A1c is 7.2 her metabolic panel and complete blood count were normal except for a blood glucose of 200  I asked her to continue to take her current medication profile for her diabetes and to follow-up with her endocrinologist and her primary care physician

## 2020-02-09 NOTE — Telephone Encounter (Signed)
Call patient to make aware to follow up with endocrinology for her diabetes, and I see she has not been taking her metformin but based on the labs done by Dr Joya Gaskins her A1c is 7.2 and this is too high.

## 2020-02-13 NOTE — Telephone Encounter (Signed)
Okay, we can try to send her message via mychart to inform her as well.

## 2020-02-13 NOTE — Telephone Encounter (Signed)
Okay I have sent the message. YRL,RMA

## 2020-02-13 NOTE — Telephone Encounter (Signed)
I attempted to call pt but was unable to leave a v/m. YRL,RMA

## 2020-02-15 ENCOUNTER — Ambulatory Visit: Payer: Medicare PPO

## 2020-02-20 ENCOUNTER — Ambulatory Visit
Admission: RE | Admit: 2020-02-20 | Discharge: 2020-02-20 | Disposition: A | Discharge status: Home / Self Care | Payer: Medicare PPO | Source: Ambulatory Visit | Attending: Critical Care Medicine | Admitting: Critical Care Medicine

## 2020-02-20 DIAGNOSIS — R1032 Left lower quadrant pain: Secondary | ICD-10-CM

## 2020-02-20 DIAGNOSIS — K8020 Calculus of gallbladder without cholecystitis without obstruction: Secondary | ICD-10-CM | POA: Diagnosis not present

## 2020-02-22 ENCOUNTER — Ambulatory Visit: Payer: Medicare PPO | Admitting: Critical Care Medicine

## 2020-02-22 ENCOUNTER — Other Ambulatory Visit: Payer: Self-pay

## 2020-02-22 DIAGNOSIS — R1032 Left lower quadrant pain: Secondary | ICD-10-CM

## 2020-02-22 DIAGNOSIS — E1165 Type 2 diabetes mellitus with hyperglycemia: Secondary | ICD-10-CM | POA: Diagnosis not present

## 2020-02-22 DIAGNOSIS — K8020 Calculus of gallbladder without cholecystitis without obstruction: Secondary | ICD-10-CM | POA: Diagnosis not present

## 2020-02-22 NOTE — Assessment & Plan Note (Signed)
Left lower quadrant abdominal pain is improved and no findings on abdominal ultrasound to suggest cause  She had a colonoscopy she cannot remember the date of this and she had polyps on that visit  I suggested she needs to follow-up with her primary care provider and potentially see gastroenterology again sooner for potential repeat colonoscopy given the left lower quadrant abdominal pain

## 2020-02-22 NOTE — Assessment & Plan Note (Signed)
Calculus of the gallbladder without cholecystitis and no obstruction  Indicated the patient to follow a low-fat diet and also the diabetic diet as well  She will monitor for symptoms

## 2020-02-22 NOTE — Progress Notes (Addendum)
Subjective:    Patient ID: Leah Robinson, female    DOB: [birth year], 69 y.o.   MRN: [id number]  This is a pleasant 69 year old female who comes to the mobile medicine clinic today for evaluation of left and right lower quadrant abdominal pain.  Patient has a history of obesity class I and controlled diabetes on Metformin.  She is also had borderline hypertension that is being observed with diet and exercise alone.  She is followed at [facility] for her primary care.  Patient gives a history of several weeks of right and then left lower inguinal and lower abdominal pain.  She was actually seen by her primary care provider on 27 January who prescribed hip and back exercises and she has been doing these it seemed to help a bit and does seem to relieve the right lower pain however the left lower quadrant pain continues.  The patient denies any urologic complaints including no dysuria urgency or frequency or change in color of urine.  The patient denies any GI complaints including no diarrhea constipation blood in the stool or rectal or anal pain.  Patient denies nausea or vomiting or reflux symptoms.  Patient's blood sugars have been in the 1 20-1 40 range last A1c was at 6.1 and she does take the Metformin daily  Patient also takes vitamin D supplementation and multivitamins along with aspirin daily  The pain is the abdomen is an aching pain and not sharp and stabbing.  It is worse with bending over.  Is not relieved with bowel movements.  Note she has had a hysterectomy but still has her ovaries.  Note in addition to the patient's primary care provider the patient also follows with endocrinology who has been assessing fructosamine levels as opposed A1c regards to her glycemic control  02/22/2020 The patient returned to the mobile medicine today to review the results of her abdominal ultrasound.  She had left lower quadrant abdominal pain.  She states her pain is actually somewhat better at  this time.  Note she does not have any change in her bowel habits and no blood in the stool.  Note labs were reviewed today from the last visit  Her fructosamine is still elevated but reduced from before it is now in the 280 range hemoglobin A1c was 7.2  Her c-Met and CBC were otherwise normal except for elevated blood sugar at 200  Her abdominal ultrasound was reviewed with the patient and it showed normal except for a gallstone 1.3 cm was not showing inflammation or biliary obstruction  Nothing in the left lower quadrant was seen that was pathologic  Past Medical History:  Diagnosis Date  . Arthritis   . Diabetes mellitus without complication ([place])      Family History  Problem Relation Age of Onset  . Breast cancer Neg Hx      Social History   Socioeconomic History  . Marital status: Married    Spouse name: Not on file  . Number of children: Not on file  . Years of education: Not on file  . Highest education level: Not on file  Occupational History  . Occupation: retired  . Occupation: part time work  Tobacco Use  . Smoking status: Former Research scientist (life sciences)  . Smokeless tobacco: Never Used  Substance and Sexual Activity  . Alcohol use: No  . Drug use: No  . Sexual activity: Not Currently  Other Topics Concern  . Not on file  Social History Narrative  .  Not on file   Social Determinants of Health   Financial Resource Strain: Low Risk   . Difficulty of Paying Living Expenses: Not hard at all  Food Insecurity: No Food Insecurity  . Worried About Charity fundraiser in the Last Year: Never true  . Ran Out of Food in the Last Year: Never true  Transportation Needs: No Transportation Needs  . Lack of Transportation (Medical): No  . Lack of Transportation (Non-Medical): No  Physical Activity: Sufficiently Active  . Days of Exercise per Week: 6 days  . Minutes of Exercise per Session: 120 min  Stress: No Stress Concern Present  . Feeling of Stress : Not at all  Social  Connections:   . Frequency of Communication with Friends and Family: Not on file  . Frequency of Social Gatherings with Friends and Family: Not on file  . Attends Religious Services: Not on file  . Active Member of Clubs or Organizations: Not on file  . Attends Archivist Meetings: Not on file  . Marital Status: Not on file  Intimate Partner Violence: Not At Risk  . Fear of Current or Ex-Partner: No  . Emotionally Abused: No  . Physically Abused: No  . Sexually Abused: No     No Known Allergies   Outpatient Medications Prior to Visit  Medication Sig Dispense Refill  . ACCU-CHEK FASTCLIX LANCETS MISC Use to check sugar once daily 102 each 3  . aspirin (ASPIRIN ADULT LOW STRENGTH) 81 MG chewable tablet Chew 1 tablet (81 mg total) by mouth daily. 30 tablet 1  . Blood Glucose Monitoring Suppl (ACCU-CHEK GUIDE) w/Device KIT 1 Device by Does not apply route daily. 1 kit 0  . cholecalciferol (VITAMIN D) 1000 UNITS tablet Take 1,000 Units by mouth daily.    Marland Kitchen glucose blood (ACCU-CHEK GUIDE) test strip Use as instructed to check sugar once daily 100 each 3  . metFORMIN (GLUCOPHAGE-XR) 500 MG 24 hr tablet Take 2 tablets (1,000 mg total) by mouth daily with breakfast. (Patient not taking: Reported on 01/10/2020) 60 tablet 5  . Multiple Vitamin (MULTIVITAMIN WITH MINERALS) TABS tablet Take 1 tablet by mouth daily.     No facility-administered medications prior to visit.     Review of Systems  Constitutional: Negative.   HENT: Negative.   Eyes: Negative.   Respiratory: Negative for cough, choking, chest tightness, shortness of breath and wheezing.   Cardiovascular: Negative for chest pain, palpitations and leg swelling.  Gastrointestinal: Positive for abdominal pain. Negative for anal bleeding, blood in stool, constipation, diarrhea, nausea, rectal pain and vomiting.       LLQ abd pain  Genitourinary: Negative for difficulty urinating, dysuria, enuresis, flank pain, frequency,  hematuria, pelvic pain, urgency, vaginal bleeding, vaginal discharge and vaginal pain.  Musculoskeletal: Negative.   Skin: Negative.   Neurological: Negative.   Hematological: Negative.   Psychiatric/Behavioral: Negative.        Objective:   Physical Exam There were no vitals filed for this visit.  Gen: Pleasant, well-nourished, in no distress,  normal affect  ENT: No lesions,  mouth clear,  oropharynx clear, no postnasal drip  Neck: No JVD, no TMG, no carotid bruits  Lungs: No use of accessory muscles, no dullness to percussion, clear without rales or rhonchi  Cardiovascular: RRR, heart sounds normal, no murmur or gallops, no peripheral edema  Abdomen: soft  no HSM,  BS normal, tender in the left lower quadrant without rebound or guarding no masses are felt  Musculoskeletal: No deformities, no cyanosis or clubbing  Neuro: alert, non focal  Skin: Warm, no lesions or rashes  A1C 7.2   CBG 191 BMP Latest Ref Rng & Units 02/08/2020 08/31/2019 06/15/2015  Glucose 65 - 99 mg/dL 200(H) 207(H) 132(H)  BUN 8 - 27 mg/dL '14 18 17  '$ Creatinine 0.57 - 1.00 mg/dL 0.81 0.86 0.78  BUN/Creat Ratio 12 - '28 17 21 '$ -  Sodium 134 - 144 mmol/L 141 140 139  Potassium 3.5 - 5.2 mmol/L 4.3 4.7 4.2  Chloride 96 - 106 mmol/L 105 104 106  CO2 20 - 29 mmol/L '23 21 26  '$ Calcium 8.7 - 10.3 mg/dL 9.5 9.4 9.6   Hepatic Function Latest Ref Rng & Units 02/08/2020  Total Protein 6.0 - 8.5 g/dL 7.4  Albumin 3.8 - 4.8 g/dL 4.0  AST 0 - 40 IU/L 14  ALT 0 - 32 IU/L 13  Alk Phosphatase 39 - 117 IU/L 78  Total Bilirubin 0.0 - 1.2 mg/dL 0.4   CBC Latest Ref Rng & Units 02/08/2020 08/31/2019 06/15/2015  WBC 3.4 - 10.8 x10E3/uL 5.3 7.3 6.6  Hemoglobin 11.1 - 15.9 g/dL 12.9 13.6 12.5  Hematocrit 34.0 - 46.6 % 38.5 41.9 36.2  Platelets 150 - 450 x10E3/uL 221 237 218     Abdominal CT scan was reviewed with the patient only finding of significance is a 1.3 cm gallstone without evidence of obstruction or  cholecystitis Assessment & Plan:  I personally reviewed all images and lab data in the [facility] system as well as any outside material available during this office visit and agree with the  radiology impressions.   Calculus of gallbladder without cholecystitis without obstruction Calculus of the gallbladder without cholecystitis and no obstruction  Indicated the patient to follow a low-fat diet and also the diabetic diet as well  She will monitor for symptoms  LLQ abdominal pain Left lower quadrant abdominal pain is improved and no findings on abdominal ultrasound to suggest cause  She had a colonoscopy she cannot remember the date of this and she had polyps on that visit  I suggested she needs to follow-up with her primary care provider and potentially see gastroenterology again sooner for potential repeat colonoscopy given the left lower quadrant abdominal pain   Diagnoses and all orders for this visit:  LLQ abdominal pain  Calculus of gallbladder without cholecystitis without obstruction  Type 2 diabetes mellitus with hyperglycemia, without long-term current use of insulin ([place])

## 2020-02-23 ENCOUNTER — Ambulatory Visit: Payer: Medicare PPO | Admitting: Podiatry

## 2020-02-27 ENCOUNTER — Ambulatory Visit: Payer: Medicare PPO | Admitting: Nurse Practitioner

## 2020-02-27 ENCOUNTER — Other Ambulatory Visit: Payer: Self-pay

## 2020-02-27 ENCOUNTER — Encounter: Payer: Self-pay | Admitting: Nurse Practitioner

## 2020-02-27 VITALS — BP 122/80 | HR 68 | Temp 98.3°F | Ht 64.0 in | Wt 209.8 lb

## 2020-02-27 DIAGNOSIS — E119 Type 2 diabetes mellitus without complications: Secondary | ICD-10-CM | POA: Diagnosis not present

## 2020-02-27 DIAGNOSIS — K8020 Calculus of gallbladder without cholecystitis without obstruction: Secondary | ICD-10-CM

## 2020-02-27 DIAGNOSIS — E785 Hyperlipidemia, unspecified: Secondary | ICD-10-CM | POA: Diagnosis not present

## 2020-02-27 DIAGNOSIS — Z6836 Body mass index (BMI) 36.0-36.9, adult: Secondary | ICD-10-CM | POA: Diagnosis not present

## 2020-02-27 DIAGNOSIS — E669 Obesity, unspecified: Secondary | ICD-10-CM | POA: Diagnosis not present

## 2020-02-27 LAB — POCT URINALYSIS DIPSTICK
Bilirubin, UA: NEGATIVE
Blood, UA: NEGATIVE
Glucose, UA: NEGATIVE
Ketones, UA: NEGATIVE
Nitrite, UA: NEGATIVE
Protein, UA: NEGATIVE
Spec Grav, UA: 1.025 (ref 1.010–1.025)
Urobilinogen, UA: 0.2 E.U./dL
pH, UA: 5 (ref 5.0–8.0)

## 2020-02-27 LAB — POCT UA - MICROALBUMIN
Albumin/Creatinine Ratio, Urine, POC: 30
Creatinine, POC: 300 mg/dL
Microalbumin Ur, POC: 30 mg/L

## 2020-02-27 NOTE — Patient Instructions (Signed)
Cholelithiasis  Cholelithiasis is also called "gallstones." It is a kind of gallbladder disease. The gallbladder is an organ that stores a liquid (bile) that helps you digest fat. Gallstones may not cause symptoms (may be silent gallstones) until they cause a blockage, and then they can cause pain (gallbladder attack). Follow these instructions at home:  Take over-the-counter and prescription medicines only as told by your doctor.  Stay at a healthy weight.  Eat healthy foods. This includes: ? Eating fewer fatty foods, like fried foods. ? Eating fewer refined carbs (refined carbohydrates). Refined carbs are breads and grains that are highly processed, like white bread and white rice. Instead, choose whole grains like whole-wheat bread and brown rice. ? Eating more fiber. Almonds, fresh fruit, and beans are healthy sources of fiber.  Keep all follow-up visits as told by your doctor. This is important. Contact a doctor if:  You have sudden pain in the upper right side of your belly (abdomen). Pain might spread to your right shoulder or your chest. This may be a sign of a gallbladder attack.  You feel sick to your stomach (are nauseous).  You throw up (vomit).  You have been diagnosed with gallstones that have no symptoms and you get: ? Belly pain. ? Discomfort, burning, or fullness in the upper part of your belly (indigestion). Get help right away if:  You have sudden pain in the upper right side of your belly, and it lasts for more than 2 hours.  You have belly pain that lasts for more than 5 hours.  You have a fever or chills.  You keep feeling sick to your stomach or you keep throwing up.  Your skin or the whites of your eyes turn yellow (jaundice).  You have dark-colored pee (urine).  You have light-colored poop (stool). Summary  Cholelithiasis is also called "gallstones."  The gallbladder is an organ that stores a liquid (bile) that helps you digest fat.  Silent  gallstones are gallstones that do not cause symptoms.  A gallbladder attack may cause sudden pain in the upper right side of your belly. Pain might spread to your right shoulder or your chest. If this happens, contact your doctor.  If you have sudden pain in the upper right side of your belly that lasts for more than 2 hours, get help right away. This information is not intended to replace advice given to you by your health care provider. Make sure you discuss any questions you have with your health care provider. Document Revised: 11/20/2017 Document Reviewed: 08/24/2016 Elsevier Patient Education  2020 [employer]  

## 2020-02-27 NOTE — Progress Notes (Signed)
Subjective:     Patient ID: Leah Robinson , female    DOB: 09/28/1951 , 69 y.o.   MRN: [id number]   Chief Complaint  Patient presents with  . prediabetes   HPI  She continues to go to the [facility] will use her mask and she is swimming as well.    She seen a provider at the [facility] mobile unit on 02/22/2020 after having pain to her left groin. He did an ultrasound which showed a gallstone.  She also had labs done which were normal.   She had covid vaccine on February 4th and February 26th which was [employer].    Wt Readings from Last 3 Encounters: 02/27/20 : 209 lb 12.8 oz (95.2 kg) 01/18/20 : 211 lb 3.2 oz (95.8 kg) 01/10/20 : 209 lb (94.8 kg)   Diabetes She presents for her follow-up ([place]) diabetic visit. She has type 2 diabetes mellitus. Pertinent negatives for hypoglycemia include no dizziness or headaches. Pertinent negatives for diabetes include no blurred vision, no chest pain and no fatigue. Symptoms are stable. There are no diabetic complications. There are no known risk factors for coronary artery disease. Current diabetic treatment includes diet. She is compliant with treatment all of the time. When asked about meal planning, she reported none. She has not had a previous visit with a dietitian. She rarely participates in exercise. (Morning blood sugars are 160 and better in the evening.  She is scheduled to see Dr. Renne Crigler.  ) Eye exam is not current (she is planning to schedule her eye exam).     Past Medical History:  Diagnosis Date  . Arthritis   . Diabetes mellitus without complication ([place])      Family History  Problem Relation Age of Onset  . Breast cancer Neg Hx      Current Outpatient Medications:  .  ACCU-CHEK FASTCLIX LANCETS MISC, Use to check sugar once daily, Disp: 102 each, Rfl: 3 .  aspirin (ASPIRIN ADULT LOW STRENGTH) 81 MG chewable tablet, Chew 1 tablet (81 mg total) by mouth daily., Disp: 30 tablet, Rfl: 1 .  Blood Glucose Monitoring Suppl (ACCU-CHEK  GUIDE) w/Device KIT, 1 Device by Does not apply route daily., Disp: 1 kit, Rfl: 0 .  cholecalciferol (VITAMIN D) 1000 UNITS tablet, Take 1,000 Units by mouth daily., Disp: , Rfl:  .  glucose blood (ACCU-CHEK GUIDE) test strip, Use as instructed to check sugar once daily, Disp: 100 each, Rfl: 3 .  Multiple Vitamin (MULTIVITAMIN WITH MINERALS) TABS tablet, Take 1 tablet by mouth daily., Disp: , Rfl:    No Known Allergies   Review of Systems  Constitutional: Negative for fatigue and fever.  Eyes: Negative for blurred vision.  Respiratory: Negative.  Negative for cough.   Cardiovascular: Negative.  Negative for chest pain, palpitations and leg swelling.  Musculoskeletal: Negative.   Neurological: Negative for dizziness and headaches.  Psychiatric/Behavioral: Negative.      Today's Vitals   02/27/20 0840  BP: 122/80  Pulse: 68  Temp: 98.3 F (36.8 C)  TempSrc: Oral  Weight: 209 lb 12.8 oz (95.2 kg)  Height: '5\' 4"'$  (1.626 m)  PainSc: 0-No pain   Body mass index is 36.01 kg/m.   Objective:  Physical Exam Constitutional:      General: She is not in acute distress.    Appearance: Normal appearance.  Cardiovascular:     Rate and Rhythm: Normal rate.     Pulses: Normal pulses.     Heart sounds: Normal  heart sounds. No murmur.  Pulmonary:     Effort: Pulmonary effort is normal. No respiratory distress.     Breath sounds: Normal breath sounds. No wheezing.  Musculoskeletal:        General: No swelling or tenderness. Normal range of motion.  Skin:    General: Skin is warm and dry.     Capillary Refill: Capillary refill takes less than 2 seconds.  Neurological:     General: No focal deficit present.     Mental Status: She is alert and oriented to person, place, and time.     Cranial Nerves: No cranial nerve deficit.         Assessment And Plan:     1. Diet-controlled diabetes mellitus (HCC)  Chronic, poor control however improved since her last HgbA1c down to 7.2 she is  being followed by Dr. Renne Crigler.  I have discussed with her the importance to have better control of her HgbA1c and the risk of cardiac events and microvascular damage from poorly controlled diabetes.   She is to continue focusing on healthy diet and exercise   - POCT Urinalysis Dipstick (81002)  2. Hyperlipidemia, unspecified hyperlipidemia type  Chronic diet controlled.  Labs done on 02/08/2020 with the mobile clinic and kidney functions were normal. - Lipid panel  3. Obesity (BMI 35.0-39.9 without comorbidity)  Chronic, she has lost approximately 2 lbs since her last visit  Continue with exercising regularly  4. Calculus of gallbladder without cholecystitis without obstruction  Seen on Ultrasound when she seen Dr. Joya Gaskins with the mobile clinic  She is advised to do a low fat diet  She would like to wait on a referral to GI for further evaluation  She is advised if she has sudden onset abdominal pain to seek medical treatment.        Minette Brine, FNP

## 2020-02-28 ENCOUNTER — Ambulatory Visit: Payer: Medicare PPO | Admitting: Podiatry

## 2020-02-28 LAB — LIPID PANEL
Chol/HDL Ratio: 3.8 ratio (ref 0.0–4.4)
Cholesterol, Total: 219 mg/dL — ABNORMAL HIGH (ref 100–199)
HDL: 57 mg/dL (ref >39)
LDL Chol Calc (NIH): 148 mg/dL — ABNORMAL HIGH (ref 0–99)
Triglycerides: 81 mg/dL (ref 0–149)
VLDL Cholesterol Cal: 14 mg/dL (ref 5–40)

## 2020-03-05 ENCOUNTER — Other Ambulatory Visit: Payer: Self-pay

## 2020-03-05 ENCOUNTER — Telehealth: Payer: Self-pay

## 2020-03-05 MED ORDER — ATORVASTATIN CALCIUM 10 MG PO TABS: 10.0000 mg | ORAL_TABLET | Freq: Every day | ORAL | 11 refills | Status: DC

## 2020-03-05 NOTE — Telephone Encounter (Signed)
Patient has been notified that we have sent a Rx for her to take. YRL,RMA

## 2020-04-02 ENCOUNTER — Other Ambulatory Visit: Payer: Self-pay

## 2020-04-03 DIAGNOSIS — E119 Type 2 diabetes mellitus without complications: Secondary | ICD-10-CM | POA: Diagnosis not present

## 2020-04-03 LAB — HM DIABETES EYE EXAM

## 2020-04-04 ENCOUNTER — Ambulatory Visit: Payer: Medicare PPO | Admitting: Internal Medicine

## 2020-04-04 ENCOUNTER — Other Ambulatory Visit: Payer: Self-pay

## 2020-04-04 ENCOUNTER — Encounter: Payer: Self-pay | Admitting: Internal Medicine

## 2020-04-04 VITALS — BP 120/62 | HR 66 | Ht 64.0 in | Wt 209.0 lb

## 2020-04-04 DIAGNOSIS — E1165 Type 2 diabetes mellitus with hyperglycemia: Secondary | ICD-10-CM | POA: Diagnosis not present

## 2020-04-04 DIAGNOSIS — E669 Obesity, unspecified: Secondary | ICD-10-CM | POA: Diagnosis not present

## 2020-04-04 DIAGNOSIS — E785 Hyperlipidemia, unspecified: Secondary | ICD-10-CM

## 2020-04-04 NOTE — Progress Notes (Signed)
Patient ID: Leah Robinson, female   DOB: March 22, 1951, 69 y.o.   MRN: [id number]   This visit occurred during the SARS-CoV-2 public health emergency.  Safety protocols were in place, including screening questions prior to the visit, additional usage of staff PPE, and extensive cleaning of exam room while observing appropriate contact time as indicated for disinfecting solutions.   HPI: Leah Robinson is a 69 y.o.-year-old female, initially referred by her [place] FNP (Dr. Glendale Chard - [facility]  Returning for follow-up for DM2, dx as prediabetes in 2016, then DM2, non-insulin-dependent, uncontrolled, without long term complications. Last visit 7 months ago.  She started to limit starches since last OV and exercises more now.  She tried Metformin ER which I suggested at last visit, but she could not tolerate it due to GI side effects.  She is in 09/2019.  Reviewed HbA1c levels: Lab Results  Component Value Date   HGBA1C 7.2 (A) 02/08/2020   HGBA1C 8.5 (H) 08/31/2019   HGBA1C 7.7 (A) 09/22/2018  09/05/2019: HbA1c calculated from fructosamine: 7.2% 09/22/2018: HbA1c calculated from fructosamine 6.5% 05/18/2018: HbA1c calculated from fructosamine is much better, at 6.5%. 06/30/2017: HbA1c 7.8% Reportedly, prev. in the 6-7% range.  She is currently off diabetes medications.  We tried Metformin ER 1000 mg with dinner-started 08/2019 >> stopped 09/2019 2/2 AP/constipation. She had to stop Metformin (started 2016) because of diarrhea and abdominal pain.  Pt checks sugars once a day: - am: 140 >> 140-160 >> 136-150 >> 150-160 >> (checks after exercise): 140-160 - 2h after b'fast: 118-140 >> 128-140 >> 138, 142 >> n/c - before lunch:  110-123 >> 130-140 >> 134-141 >> n/c - 2h after lunch:126, 212 >> 121-128 >> 129-141 >> n/c - before dinner:  131-143 >> 112-124, 160, 195 >> 130 >> 99 - 2h after dinner: 118-130, 150 >> 89, 104-140 >> 118 >> n/c - bedtime: n/c >> 134, 149 >> 120  >> n/c >> 126 >> n/c - nighttime: n/c Lowest: 110 >> 99 Highest: 180 (sweets) >> 160  Glucometer: Accu-Chek guide   Pt's meals are: - Breakfast: toast, bacon, cereal, sandwich, ham bisqit, boiled egg - Lunch: salad + nuts, [place] sandwich, tuna - Dinner: greens or veggies, chicken/pork/fish - Snacks: 3 (mostly at night)  -+ Mild CKD, last BUN/creatinine:  Lab Results  Component Value Date   BUN 14 02/08/2020   BUN 18 08/31/2019   CREATININE 0.81 02/08/2020   CREATININE 0.86 08/31/2019  06/30/2017: CMP normal, but with a glucose of 168, BUN/Cr 14/0.86 (GFR 81)  12/10/2016: 10/0.80, eGFR 89, Glu 126  -+ HL. Latest lipids: Lab Results  Component Value Date   CHOL 219 (H) 02/27/2020   HDL 57 02/27/2020   LDLCALC 148 (H) 02/27/2020   TRIG 81 02/27/2020   CHOLHDL 3.8 02/27/2020  06/30/2017: 223/102/58/145  12/10/2016: 186/83/51/118 Recently started Lipitor low dose.  - last eye exam was in 04/03/2020: No DR, low stage cataracts -+ Occasional numbness and tingling in her feet.  Before the coronavirus pandemic, she was going to the gym: swimming, walking, weights, Silver sneakers class-  5x a week.  [facility]  ROS: Constitutional: no weight gain/no weight loss, no fatigue, no subjective hyperthermia, no subjective hypothermia Eyes: no blurry vision, no xerophthalmia ENT: no sore throat, no nodules palpated in neck, no dysphagia, no odynophagia, no hoarseness Cardiovascular: no CP/no SOB/no palpitations/no leg swelling Respiratory: no cough/no SOB/no wheezing Gastrointestinal: no N/no V/no  D/no C/no acid reflux Musculoskeletal: no muscle aches/+ joint aches Skin: no rashes, no hair loss Neurological: no tremors/+ numbness/+ tingling/no dizziness  I reviewed pt's medications, allergies, PMH, social hx, family hx, and changes were documented in the history of present illness. Otherwise, unchanged from my initial visit note.  Past  Medical History:  Diagnosis Date  . Arthritis   . Diabetes mellitus without complication [facility])    Past Surgical History:  Procedure Laterality Date  . DILATATION & CURRETTAGE/HYSTEROSCOPY WITH RESECTOCOPE N/A 06/21/2015   Procedure: DILATATION & CURETTAGE/HYSTEROSCOPY WITH RESECTOCOPE;  Surgeon: Servando Salina, MD;  Location: [place] [facility];  Service: Gynecology;  Laterality: N/A;  . ROBOTIC ASSISTED SALPINGO OOPHERECTOMY Left 06/21/2015   Procedure: ROBOTIC ASSISTED LEFT SALPINGO OOPHORECTOMY AND RIGHT SALPINGECTOMY With  Pelvic Washings;  Surgeon: Servando Salina, MD;  Location: [place] [facility];  Service: Gynecology;  Laterality: Left;  2 1/2 hrs.  . TUBAL LIGATION     Social History   Social History  . Marital status: Married    Spouse name: N/A  . Number of children: 1   Occupational History  . Retired from [employer]   Social History Main Topics  . Smoking status: Former Research scientist (life sciences), quit in 1995  . Smokeless tobacco: Never Used  . Alcohol use No  . Drug use: No   Current Outpatient Medications on File Prior to Visit  Medication Sig Dispense Refill  . ACCU-CHEK FASTCLIX LANCETS MISC Use to check sugar once daily 102 each 3  . aspirin (ASPIRIN ADULT LOW STRENGTH) 81 MG chewable tablet Chew 1 tablet (81 mg total) by mouth daily. 30 tablet 1  . atorvastatin (LIPITOR) 10 MG tablet Take 1 tablet (10 mg total) by mouth daily. 30 tablet 11  . Blood Glucose Monitoring Suppl (ACCU-CHEK GUIDE) w/Device KIT 1 Device by Does not apply route daily. 1 kit 0  . cholecalciferol (VITAMIN D) 1000 UNITS tablet Take 1,000 Units by mouth daily.    Marland Kitchen glucose blood (ACCU-CHEK GUIDE) test strip Use as instructed to check sugar once daily 100 each 3  . Multiple Vitamin (MULTIVITAMIN WITH MINERALS) TABS tablet Take 1 tablet by mouth daily.     No current facility-administered medications on file prior to visit.   No Known Allergies   Pertinent FH: Pt has FH of DM in mother, maternal uncle.  PE: BP 120/62   Pulse  66   Ht '5\' 4"'$  (1.626 m)   Wt 209 lb (94.8 kg)   SpO2 97%   BMI 35.87 kg/m  Wt Readings from Last 3 Encounters:  04/04/20 209 lb (94.8 kg)  02/27/20 209 lb 12.8 oz (95.2 kg)  01/18/20 211 lb 3.2 oz (95.8 kg)   Constitutional: no weight gain/no weight loss, no fatigue, no subjective hyperthermia, no subjective hypothermia Eyes: no blurry vision, no xerophthalmia ENT: no sore throat, no nodules palpated in neck, no dysphagia, no odynophagia, no hoarseness Cardiovascular: no CP/no SOB/no palpitations/no leg swelling Respiratory: no cough/no SOB/no wheezing Gastrointestinal: no N/no V/no D/no C/no acid reflux Musculoskeletal: no muscle aches/no joint aches Skin: no rashes, no hair loss Neurological: no tremors/no numbness/no tingling/no dizziness  I reviewed pt's medications, allergies, PMH, social hx, family hx, and changes were documented in the history of present illness. Otherwise, unchanged from my initial visit note.  ASSESSMENT: 1. DM2, non-insulin-dependent (diet-controlled), without long term complications, but with hyperglycemia  2. HL  3.  Obesity class II  PLAN:  1. Patient with relatively well-controlled diabetes, previously on Metformin, which she could not  tolerate because of abdominal cramps and diarrhea.  However, at last visit, as her sugars are higher, I suggested starting Metformin ER.  We discussed that if she could not tolerate it another option is Rybelsus.  Since then, sugars improved.  Latest HbA1c obtained by PCP 2 months ago was 7.2%. -At this visit, she tells me that she had abdominal pain and constipation from Metformin and stopped a month after she started it.  She has now been off of the medication for 6 months.  Unfortunately, she is not checking sugars and she only had 2 values in her log in the last 6 months.  She mentions that she sometimes check and does not write them down.  However, per her recall, in the morning, that sugars have been doing well 40  and 160 but she does not check as soon as she wakes up but after exercise, after she returns from the [facility].  I advised him to check and making.  Also, she needs to check later in the day, rotating check times.  For now, will continue without Metformin especially since her latest HbA1c obtained a month ago was better, at 7.2%.  We usually follow her with fructosamine levels, but we would not check this today since last HbA1c was better. - I suggested to:  Patient Instructions  Please continue off Metformin.  Check sugars once a day, rotating check times, including when you wake up in the morning.  Please return in 4 months with your sugar log.  - advised to check sugars at different times of the day - 1x a day, rotating check times - advised for yearly eye exams >> she is UTD - return to clinic in 4 months   2. HL -Reviewed latest lipid panel from last month: LDL quite high, the rest of the fractions at goal. Lab Results  Component Value Date   CHOL 219 (H) 02/27/2020   HDL 57 02/27/2020   LDLCALC 148 (H) 02/27/2020   TRIG 81 02/27/2020   CHOLHDL 3.8 02/27/2020  -She was started on Lipitor 10 qod by PCP  3.  Obesity class II -Continues to exercise at the gym -Weight stable since last OV  [name], MD PhD [facility]

## 2020-04-04 NOTE — Patient Instructions (Signed)
Please continue off Metformin.  Check sugars once a day, rotating check times, including when you wake up in the morning.  Please return in 4 months with your sugar log.

## 2020-06-06 ENCOUNTER — Other Ambulatory Visit: Payer: Self-pay | Admitting: Internal Medicine

## 2020-06-06 DIAGNOSIS — Z1231 Encounter for screening mammogram for malignant neoplasm of breast: Secondary | ICD-10-CM

## 2020-07-10 ENCOUNTER — Ambulatory Visit
Admission: RE | Admit: 2020-07-10 | Discharge: 2020-07-10 | Disposition: A | Discharge status: Home / Self Care | Payer: Medicare PPO | Source: Ambulatory Visit | Attending: Internal Medicine | Admitting: Internal Medicine

## 2020-07-10 ENCOUNTER — Other Ambulatory Visit: Payer: Self-pay

## 2020-07-10 DIAGNOSIS — Z1231 Encounter for screening mammogram for malignant neoplasm of breast: Secondary | ICD-10-CM

## 2020-08-15 ENCOUNTER — Other Ambulatory Visit: Payer: Medicare PPO

## 2020-08-15 ENCOUNTER — Other Ambulatory Visit: Payer: Self-pay

## 2020-08-15 DIAGNOSIS — Z20822 Contact with and (suspected) exposure to covid-19: Secondary | ICD-10-CM | POA: Diagnosis not present

## 2020-08-16 LAB — SARS-COV-2, NAA 2 DAY TAT

## 2020-08-16 LAB — NOVEL CORONAVIRUS, NAA: SARS-CoV-2, NAA: NOT DETECTED

## 2020-08-24 ENCOUNTER — Ambulatory Visit: Payer: Medicare PPO | Admitting: Internal Medicine

## 2020-09-03 ENCOUNTER — Encounter: Payer: Medicare PPO | Admitting: Nurse Practitioner

## 2020-09-05 ENCOUNTER — Encounter: Payer: Medicare PPO | Admitting: Nurse Practitioner

## 2020-09-05 IMAGING — MG DIGITAL SCREENING BILATERAL MAMMOGRAM WITH TOMO AND CAD
6 of 10 series · 6 of 30 positions shown · non-contrast
Comparison: Previous exam(s).

ACR Breast Density Category a: The breast tissue is almost entirely
fatty.

CLINICAL DATA: Screening.

EXAM:
DIGITAL SCREENING BILATERAL MAMMOGRAM WITH TOMO AND CAD

[R CC synth-2D]
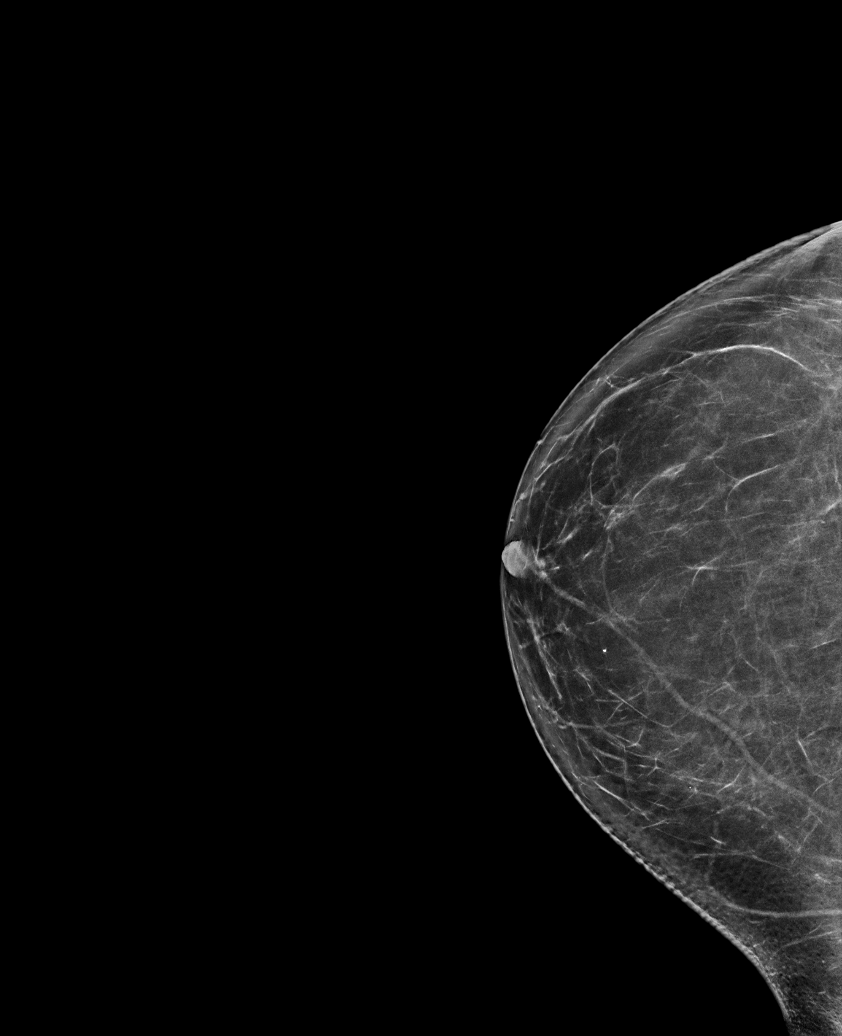

[L CC synth-2D]
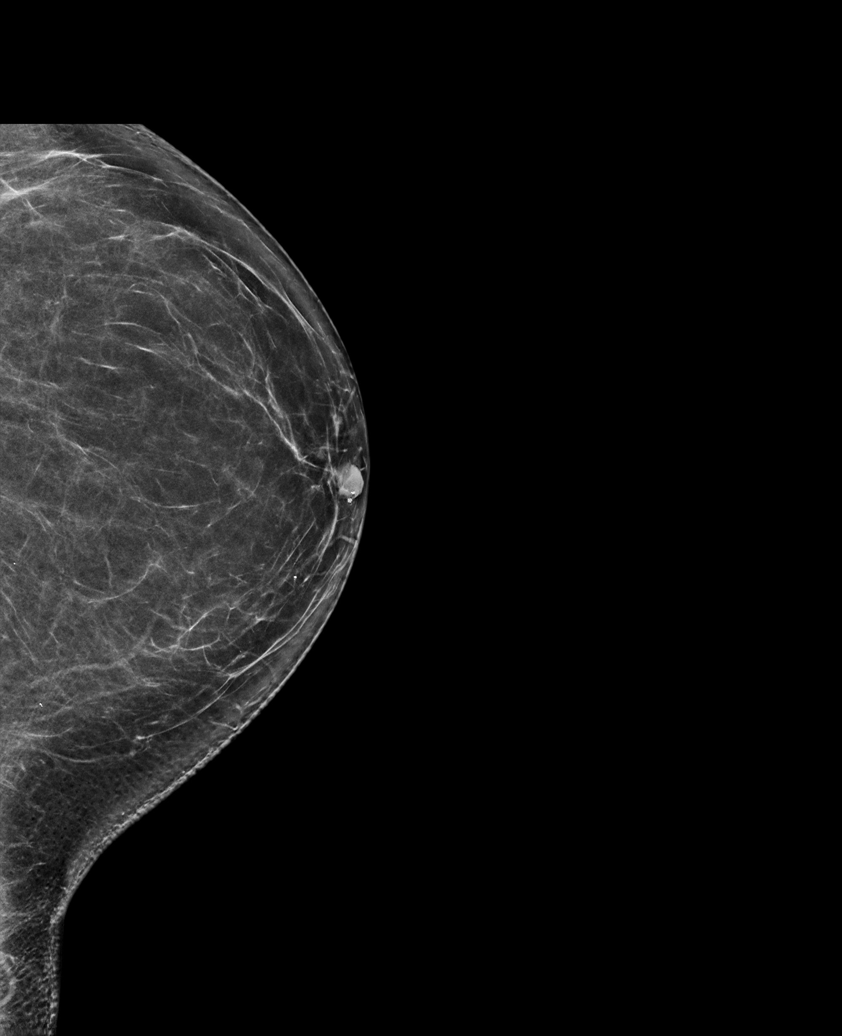

[R MLO synth-2D]
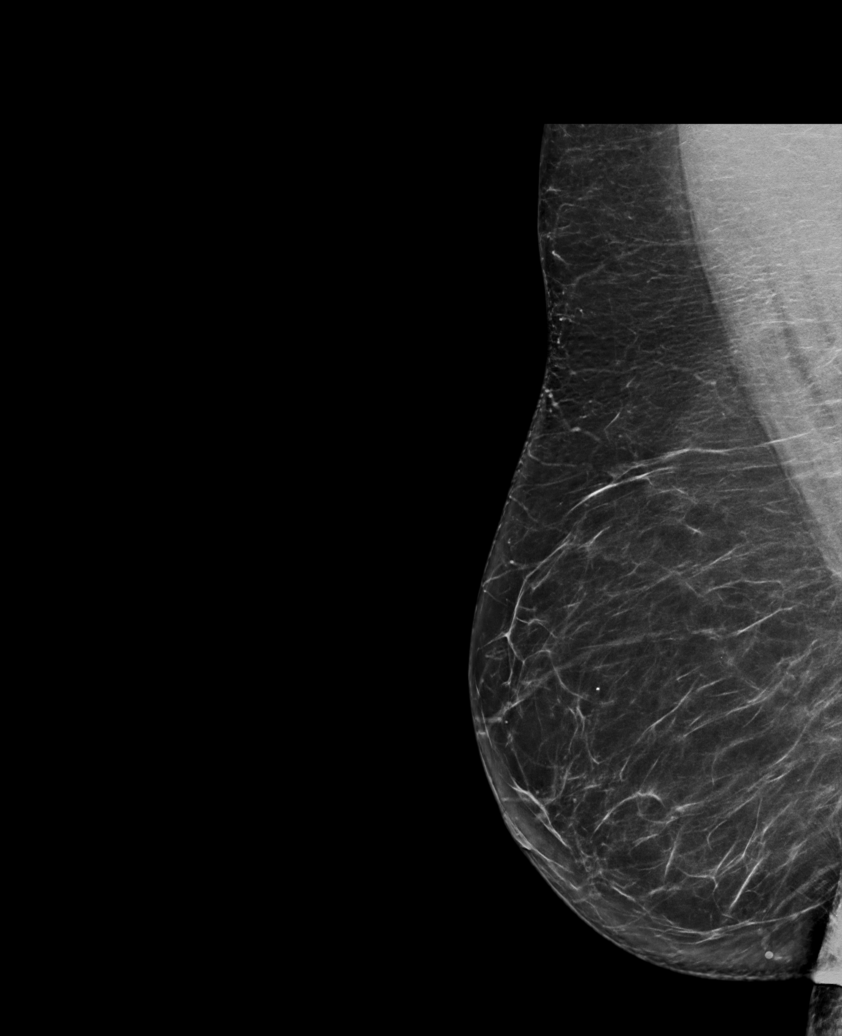

[L MLO synth-2D (1 of 2)]
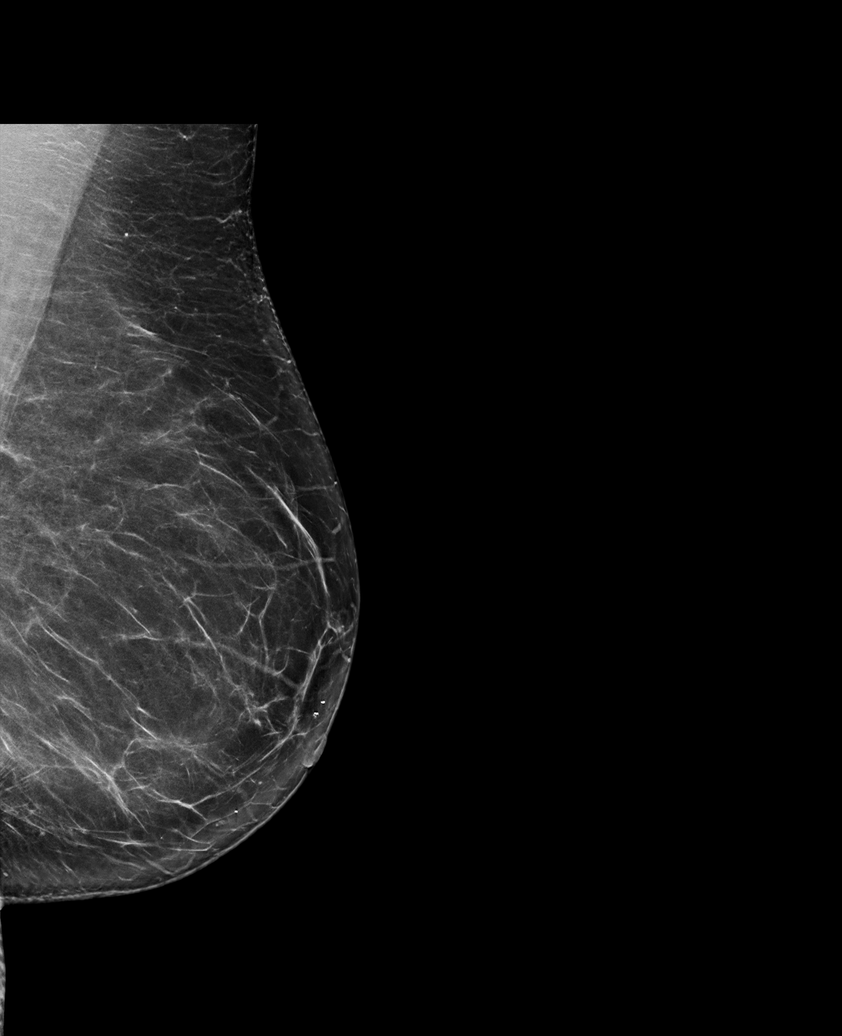

[L MLO synth-2D (2 of 2)]
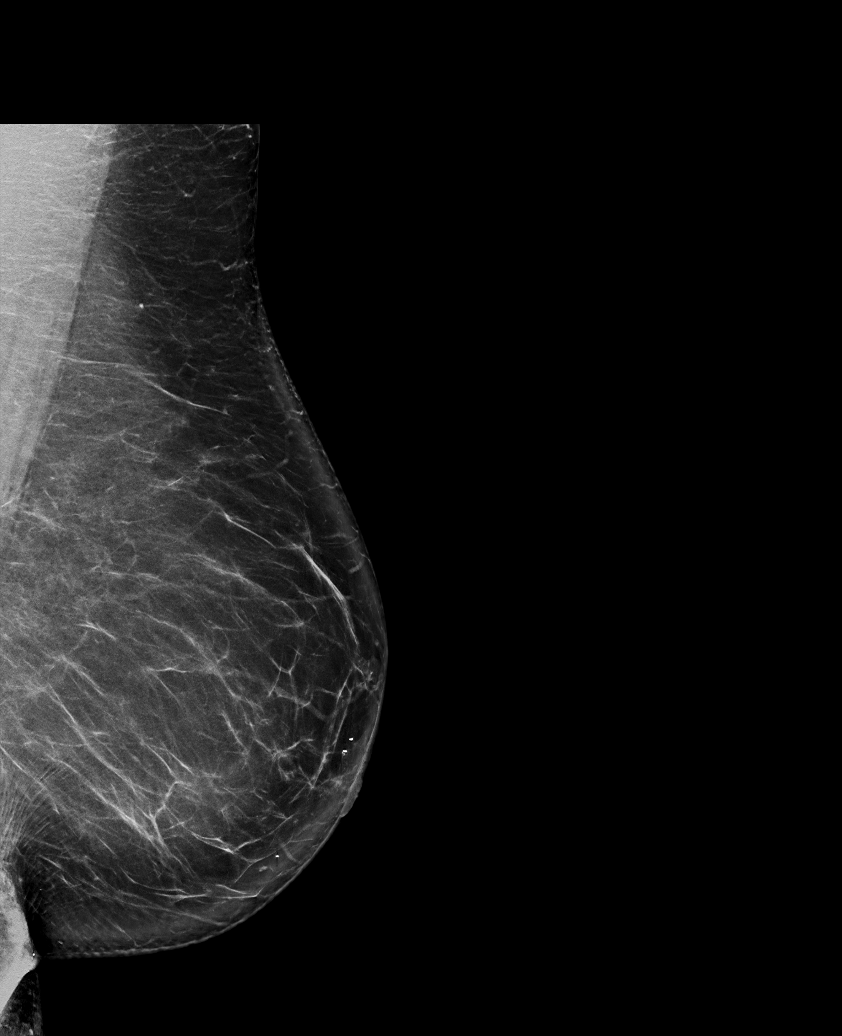

[R MLO tomo · tomo slice 43/84.0]
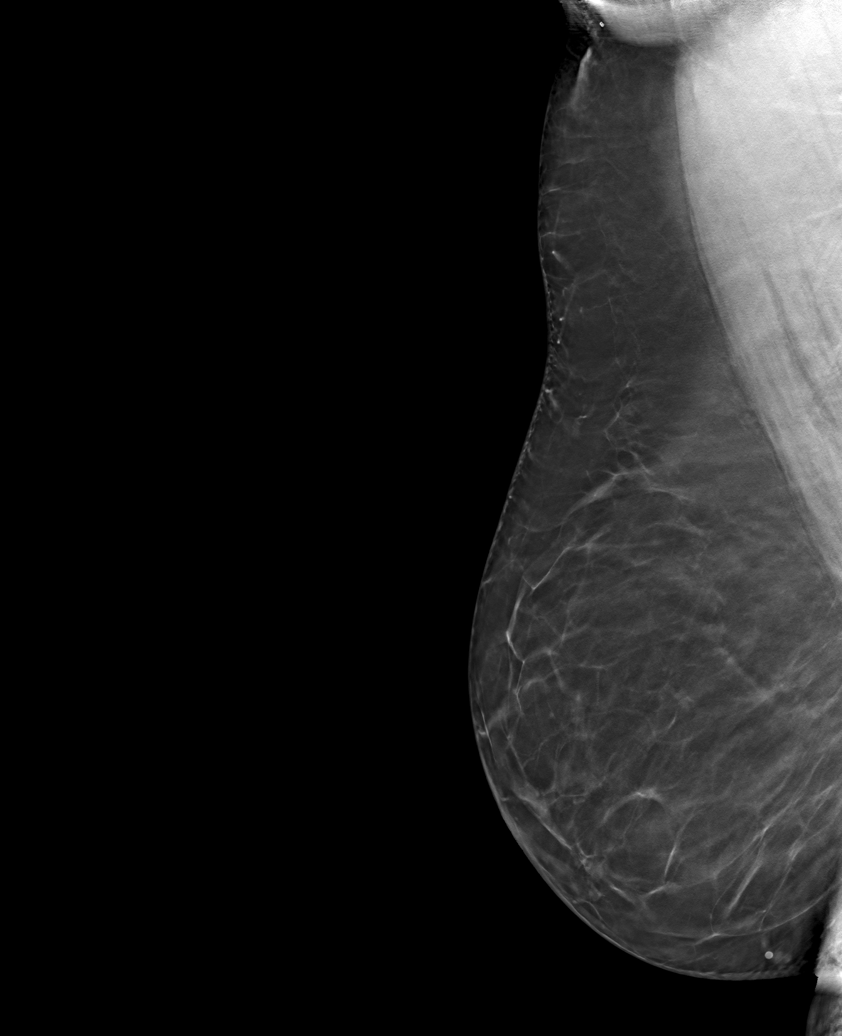

[6 of 30 positions shown; findings below may reference images not displayed]

FINDINGS: There are no findings suspicious for malignancy. Images were
processed with CAD.
IMPRESSION: No mammographic evidence of malignancy. A result letter of this
screening mammogram will be mailed directly to the patient.

RECOMMENDATION:
Screening mammogram in one year. (Code:[id number]-[id number])

BI-RADS CATEGORY  1: Negative.

## 2020-10-10 ENCOUNTER — Other Ambulatory Visit: Payer: Self-pay

## 2020-10-10 ENCOUNTER — Ambulatory Visit: Payer: Medicare PPO | Admitting: Nurse Practitioner

## 2020-10-10 ENCOUNTER — Ambulatory Visit (INDEPENDENT_AMBULATORY_CARE_PROVIDER_SITE_OTHER): Payer: Medicare PPO

## 2020-10-10 ENCOUNTER — Encounter: Payer: Self-pay | Admitting: Nurse Practitioner

## 2020-10-10 VITALS — BP 124/70 | HR 74 | Temp 98.6°F | Ht 64.8 in | Wt 209.4 lb

## 2020-10-10 DIAGNOSIS — Z0000 Encounter for general adult medical examination without abnormal findings: Secondary | ICD-10-CM

## 2020-10-10 DIAGNOSIS — E1165 Type 2 diabetes mellitus with hyperglycemia: Secondary | ICD-10-CM | POA: Diagnosis not present

## 2020-10-10 DIAGNOSIS — G8929 Other chronic pain: Secondary | ICD-10-CM

## 2020-10-10 DIAGNOSIS — Z23 Encounter for immunization: Secondary | ICD-10-CM | POA: Diagnosis not present

## 2020-10-10 DIAGNOSIS — E559 Vitamin D deficiency, unspecified: Secondary | ICD-10-CM | POA: Diagnosis not present

## 2020-10-10 DIAGNOSIS — M25512 Pain in left shoulder: Secondary | ICD-10-CM

## 2020-10-10 DIAGNOSIS — Z79899 Other long term (current) drug therapy: Secondary | ICD-10-CM

## 2020-10-10 DIAGNOSIS — E785 Hyperlipidemia, unspecified: Secondary | ICD-10-CM | POA: Diagnosis not present

## 2020-10-10 MED ORDER — ATORVASTATIN CALCIUM 10 MG PO TABS: ORAL_TABLET | ORAL | 1 refills | Status: DC

## 2020-10-10 NOTE — Progress Notes (Signed)
I,Yamilka Roman [employer] as a Education administrator for [employer], FNP.,have documented all relevant documentation on the behalf of Minette Brine, FNP,as directed by  Minette Brine, FNP while in the presence of Minette Brine, [place]. This visit occurred during the SARS-CoV-2 public health emergency.  Safety protocols were in place, including screening questions prior to the visit, additional usage of staff PPE, and extensive cleaning of exam room while observing appropriate contact time as indicated for disinfecting solutions.  Subjective:     Patient ID: Leah Robinson , female    DOB: [birth year] , 69 y.o.   MRN: [id number]   Chief Complaint  Patient presents with  . Annual Exam    HPI  Patient here for HM.  She continues to working in a [employer]  She has been taking vitamin D3 2,000 units Diabetes She presents for her follow-up ([place]) diabetic visit. She has type 2 diabetes mellitus. Pertinent negatives for hypoglycemia include no dizziness or headaches. Pertinent negatives for diabetes include no blurred vision, no chest pain and no fatigue. Symptoms are stable. There are no diabetic complications. There are no known risk factors for coronary artery disease. Current diabetic treatment includes diet. She is compliant with treatment all of the time. When asked about meal planning, she reported none. She has not had a previous visit with a dietitian. She rarely participates in exercise. (Afternoons her blood sugar averages 120 - she is checking every other day) Eye exam is not current (she is planning to schedule her eye exam).     Past Medical History:  Diagnosis Date  . Arthritis   . Diabetes mellitus without complication ([place])      Family History  Problem Relation Age of Onset  . Breast cancer Neg Hx      Current Outpatient Medications:  .  Accu-Chek FastClix Lancets MISC, Use to check sugar once daily, Disp: 102 each, Rfl: 3 .  aspirin (ASPIRIN ADULT LOW STRENGTH) 81 MG chewable  tablet, Chew 1 tablet (81 mg total) by mouth daily., Disp: 30 tablet, Rfl: 1 .  atorvastatin (LIPITOR) 10 MG tablet, Take 1 tablet by mouth daily, Disp: 90 tablet, Rfl: 3 .  Blood Glucose Monitoring Suppl (ACCU-CHEK GUIDE) w/Device KIT, 1 Device by Does not apply route daily., Disp: 1 kit, Rfl: 0 .  cholecalciferol (VITAMIN D) 1000 UNITS tablet, Take 1,000 Units by mouth daily., Disp: , Rfl:  .  dapagliflozin propanediol (FARXIGA) 5 MG TABS tablet, Take 1 tablet (5 mg total) by mouth daily before breakfast., Disp: 90 tablet, Rfl: 3 .  glucose blood (ACCU-CHEK GUIDE) test strip, Use as instructed to check sugar once daily, Disp: 100 each, Rfl: 3 .  Multiple Vitamin (MULTIVITAMIN WITH MINERALS) TABS tablet, Take 1 tablet by mouth daily., Disp: , Rfl:  .  vitamin B-12 (CYANOCOBALAMIN) 1000 MCG tablet, Take 1,000 mcg by mouth daily., Disp: , Rfl:    No Known Allergies    The patient states she uses post menopausal status for birth control.  No LMP recorded. Patient is postmenopausal.. Negative for Dysmenorrhea and Negative for Menorrhagia. Negative for: breast discharge, breast lump(s), breast pain and breast self exam. Associated symptoms include abnormal vaginal bleeding. Pertinent negatives include abnormal bleeding (hematology), anxiety, decreased libido, depression, difficulty falling sleep, dyspareunia, history of infertility, nocturia, sexual dysfunction, sleep disturbances, urinary incontinence, urinary urgency, vaginal discharge and vaginal itching. Diet regular, she has been dealing with increased deaths of close family members and friends. The patient states her exercise level is moderate.  The patient's tobacco use is:  Social History   Tobacco Use  Smoking Status Former Smoker  Smokeless Tobacco Never Used  . She has been exposed to passive smoke. The patient's alcohol use is:  Social History   Substance and Sexual Activity  Alcohol Use No    Review of Systems   Constitutional: Negative.  Negative for fatigue.  HENT: Negative.   Eyes: Negative.  Negative for blurred vision.  Respiratory: Negative.   Cardiovascular: Negative.  Negative for chest pain.  Gastrointestinal: Negative.   Endocrine: Negative.   Genitourinary: Negative.   Musculoskeletal: Positive for arthralgias (left shoulder pain present but not too bad, does not bother her with what she has to do.  Takes aleve).  Skin: Negative.   Allergic/Immunologic: Negative.   Neurological: Negative.  Negative for dizziness and headaches.  Hematological: Negative.   Psychiatric/Behavioral: Negative.      Today's Vitals   10/10/20 0856  BP: 124/70  Pulse: 74  Temp: 98.6 F (37 C)  TempSrc: Oral  Weight: 209 lb 7 oz (95 kg)  Height: 5' 4.8" (1.646 m)   Body mass index is 35.07 kg/m.   Objective:  Physical Exam Constitutional:      General: She is not in acute distress.    Appearance: Normal appearance. She is well-developed. She is obese.  HENT:     Head: Normocephalic and atraumatic.     Right Ear: Hearing, tympanic membrane, ear canal and external ear normal. There is no impacted cerumen.     Left Ear: Hearing, tympanic membrane, ear canal and external ear normal. There is no impacted cerumen.     Nose:     Comments: Deferred - masked    Mouth/Throat:     Comments: Deferred - masked Eyes:     General: Lids are normal.     Extraocular Movements: Extraocular movements intact.     Conjunctiva/sclera: Conjunctivae normal.     Pupils: Pupils are equal, round, and reactive to light.     Funduscopic exam:    Right eye: No papilledema.        Left eye: No papilledema.  Neck:     Thyroid: No thyroid mass.     Vascular: No carotid bruit.  Cardiovascular:     Rate and Rhythm: Normal rate and regular rhythm.     Pulses: Normal pulses.     Heart sounds: Normal heart sounds. No murmur heard.   Pulmonary:     Effort: Pulmonary effort is normal. No respiratory distress.      Breath sounds: Normal breath sounds. No wheezing.  Chest:     Chest wall: No mass.  Breasts:     Tanner Score is 5.     Right: Normal. No mass, tenderness, axillary adenopathy or supraclavicular adenopathy.     Left: Normal. No mass, tenderness, axillary adenopathy or supraclavicular adenopathy.    Abdominal:     General: Abdomen is flat. Bowel sounds are normal. There is no distension.     Palpations: Abdomen is soft.     Tenderness: There is no abdominal tenderness.  Genitourinary:    Rectum: Guaiac result negative.  Musculoskeletal:        General: Tenderness (left shoulder, range of motion is good) present. No swelling. Normal range of motion.     Cervical back: Full passive range of motion without pain, normal range of motion and neck supple.     Right lower leg: No edema.     Left lower leg: No  edema.  Lymphadenopathy:     Upper Body:     Right upper body: No supraclavicular, axillary or pectoral adenopathy.     Left upper body: No supraclavicular, axillary or pectoral adenopathy.  Skin:    General: Skin is warm and dry.     Capillary Refill: Capillary refill takes less than 2 seconds.  Neurological:     General: No focal deficit present.     Mental Status: She is alert and oriented to person, place, and time.     Cranial Nerves: No cranial nerve deficit.     Sensory: No sensory deficit.  Psychiatric:        Mood and Affect: Mood normal.        Behavior: Behavior normal.        Thought Content: Thought content normal.        Judgment: Judgment normal.         Assessment And Plan:     1. Encounter for general adult medical examination w/o abnormal findings . Behavior modifications discussed and diet history reviewed.   . Pt will continue to exercise regularly and modify diet with low GI, plant based foods and decrease intake of processed foods.  . Recommend intake of daily multivitamin, Vitamin D, and calcium.  . Recommend mammogram and colonoscopy (up to date) for  preventive screenings, as well as recommend immunizations that include influenza, TDAP  . She had her AWV by [facility] done today as well - CBC  2. Hyperlipidemia, unspecified hyperlipidemia type  Chronic, controlled  Continue with current medications, tolerating medications well - Lipid panel  3. Type 2 diabetes mellitus with hyperglycemia, without long-term current use of insulin (HCC) Chronic, continue follow up with Endocrinology - CMP14+EGFR  4. Chronic left shoulder pain  Tenderness to left shoulder with good range of motion, does not want any medications at this time - VITAMIN D 25 Hydroxy (Vit-D Deficiency, Fractures)  5. Other long term (current) drug therapy - CBC     Patient was given opportunity to ask questions. Patient verbalized understanding of the plan and was able to repeat key elements of the plan. All questions were answered to their satisfaction.   Minette Brine, FNP    I, Minette Brine, FNP, have reviewed all documentation for this visit. The documentation on 12/20/20 for the exam, diagnosis, procedures, and orders are all accurate and complete.  THE PATIENT IS ENCOURAGED TO PRACTICE SOCIAL DISTANCING DUE TO THE COVID-19 PANDEMIC.

## 2020-10-10 NOTE — Patient Instructions (Signed)
Health Maintenance, Female Adopting a healthy lifestyle and getting preventive care are important in promoting health and wellness. Ask your health care provider about:  The right schedule for you to have regular tests and exams.  Things you can do on your own to prevent diseases and keep yourself healthy. What should I know about diet, weight, and exercise? Eat a healthy diet   Eat a diet that includes plenty of vegetables, fruits, low-fat dairy products, and lean protein.  Do not eat a lot of foods that are high in solid fats, added sugars, or sodium. Maintain a healthy weight Body mass index (BMI) is used to identify weight problems. It estimates body fat based on height and weight. Your health care provider can help determine your BMI and help you achieve or maintain a healthy weight. Get regular exercise Get regular exercise. This is one of the most important things you can do for your health. Most adults should:  Exercise for at least 150 minutes each week. The exercise should increase your heart rate and make you sweat (moderate-intensity exercise).  Do strengthening exercises at least twice a week. This is in addition to the moderate-intensity exercise.  Spend less time sitting. Even light physical activity can be beneficial. Watch cholesterol and blood lipids Have your blood tested for lipids and cholesterol at 69 years of age, then have this test every 5 years. Have your cholesterol levels checked more often if:  Your lipid or cholesterol levels are high.  You are older than 69 years of age.  You are at high risk for heart disease. What should I know about cancer screening? Depending on your health history and family history, you may need to have cancer screening at various ages. This may include screening for:  Breast cancer.  Cervical cancer.  Colorectal cancer.  Skin cancer.  Lung cancer. What should I know about heart disease, diabetes, and high blood  pressure? Blood pressure and heart disease  High blood pressure causes heart disease and increases the risk of stroke. This is more likely to develop in people who have high blood pressure readings, are of African descent, or are overweight.  Have your blood pressure checked: ? Every 3-5 years if you are 18-39 years of age. ? Every year if you are 40 years old or older. Diabetes Have regular diabetes screenings. This checks your fasting blood sugar level. Have the screening done:  Once every three years after age 40 if you are at a normal weight and have a low risk for diabetes.  More often and at a younger age if you are overweight or have a high risk for diabetes. What should I know about preventing infection? Hepatitis B If you have a higher risk for hepatitis B, you should be screened for this virus. Talk with your health care provider to find out if you are at risk for hepatitis B infection. Hepatitis C Testing is recommended for:  Everyone born from 1945 through 1965.  Anyone with known risk factors for hepatitis C. Sexually transmitted infections (STIs)  Get screened for STIs, including gonorrhea and chlamydia, if: ? You are sexually active and are younger than 69 years of age. ? You are older than 69 years of age and your health care provider tells you that you are at risk for this type of infection. ? Your sexual activity has changed since you were last screened, and you are at increased risk for chlamydia or gonorrhea. Ask your health care provider if   you are at risk.  Ask your health care provider about whether you are at high risk for HIV. Your health care provider may recommend a prescription medicine to help prevent HIV infection. If you choose to take medicine to prevent HIV, you should first get tested for HIV. You should then be tested every 3 months for as long as you are taking the medicine. Pregnancy  If you are about to stop having your period (premenopausal) and  you may become pregnant, seek counseling before you get pregnant.  Take 400 to 800 micrograms (mcg) of folic acid every day if you become pregnant.  Ask for birth control (contraception) if you want to prevent pregnancy. Osteoporosis and menopause Osteoporosis is a disease in which the bones lose minerals and strength with aging. This can result in bone fractures. If you are 65 years old or older, or if you are at risk for osteoporosis and fractures, ask your health care provider if you should:  Be screened for bone loss.  Take a calcium or vitamin D supplement to lower your risk of fractures.  Be given hormone replacement therapy (HRT) to treat symptoms of menopause. Follow these instructions at home: Lifestyle  Do not use any products that contain nicotine or tobacco, such as cigarettes, e-cigarettes, and chewing tobacco. If you need help quitting, ask your health care provider.  Do not use street drugs.  Do not share needles.  Ask your health care provider for help if you need support or information about quitting drugs. Alcohol use  Do not drink alcohol if: ? Your health care provider tells you not to drink. ? You are pregnant, may be pregnant, or are planning to become pregnant.  If you drink alcohol: ? Limit how much you use to 0-1 drink a day. ? Limit intake if you are breastfeeding.  Be aware of how much alcohol is in your drink. In the [place] one drink equals one 12 oz bottle of beer (355 mL), one 5 oz glass of wine (148 mL), or one 1 oz glass of hard liquor (44 mL). General instructions  Schedule regular health, dental, and eye exams.  Stay current with your vaccines.  Tell your health care provider if: ? You often feel depressed. ? You have ever been abused or do not feel safe at home. Summary  Adopting a healthy lifestyle and getting preventive care are important in promoting health and wellness.  Follow your health care provider's instructions about healthy  diet, exercising, and getting tested or screened for diseases.  Follow your health care provider's instructions on monitoring your cholesterol and blood pressure. This information is not intended to replace advice given to you by your health care provider. Make sure you discuss any questions you have with your health care provider. Document Revised: 12/01/2018 Document Reviewed: 12/01/2018 [employer] Patient Education  2020 [employer]  

## 2020-10-10 NOTE — Progress Notes (Signed)
This visit occurred during the SARS-CoV-2 public health emergency.  Safety protocols were in place, including screening questions prior to the visit, additional usage of staff PPE, and extensive cleaning of exam room while observing appropriate contact time as indicated for disinfecting solutions.  Subjective:   Leah Robinson is a 69 y.o. female who presents for Medicare Annual (Subsequent) preventive examination.  Review of Systems     Cardiac Risk Factors include: advanced age (>39mn, >>32women);diabetes mellitus;obesity (BMI >30kg/m2)     Objective:    Today's Vitals   10/10/20 0843  BP: 124/70  Pulse: 74  Temp: 98.6 F (37 C)  TempSrc: Oral  SpO2: 98%  Weight: 209 lb 6.4 oz (95 kg)  Height: 5' 4.8" (1.646 m)   Body mass index is 35.06 kg/m.  Advanced Directives 10/10/2020 01/10/2020 08/31/2019 06/21/2015 06/15/2015  Does Patient Have a Medical Advance Directive? Yes Yes Yes No No  Type of AParamedicof [place] will Healthcare Power of AMerriam WoodsLiving will - -  Does patient want to make changes to medical advance directive? - No - Patient declined - - -  Copy of HCrystal Riverin Chart? No - copy requested - No - copy requested - -  Would patient like information on creating a medical advance directive? - - - - Yes - Educational materials given    Current Medications (verified) Outpatient Encounter Medications as of 10/10/2020  Medication Sig  . ACCU-CHEK FASTCLIX LANCETS MISC Use to check sugar once daily  . aspirin (ASPIRIN ADULT LOW STRENGTH) 81 MG chewable tablet Chew 1 tablet (81 mg total) by mouth daily.  . Blood Glucose Monitoring Suppl (ACCU-CHEK GUIDE) w/Device KIT 1 Device by Does not apply route daily.  . cholecalciferol (VITAMIN D) 1000 UNITS tablet Take 1,000 Units by mouth daily.  .Marland Kitchenglucose blood (ACCU-CHEK GUIDE) test strip Use as instructed to check sugar once daily  . Multiple  Vitamin (MULTIVITAMIN WITH MINERALS) TABS tablet Take 1 tablet by mouth daily.  . vitamin B-12 (CYANOCOBALAMIN) 1000 MCG tablet Take 1,000 mcg by mouth daily.  . [DISCONTINUED] atorvastatin (LIPITOR) 10 MG tablet Take 1 tablet (10 mg total) by mouth daily.   No facility-administered encounter medications on file as of 10/10/2020.    Allergies (verified) Patient has no known allergies.   History: Past Medical History:  Diagnosis Date  . Arthritis   . Diabetes mellitus without complication ([facility]    Past Surgical History:  Procedure Laterality Date  . DILATATION & CURRETTAGE/HYSTEROSCOPY WITH RESECTOCOPE N/A 06/21/2015   Procedure: DILATATION & CURETTAGE/HYSTEROSCOPY WITH RESECTOCOPE;  Surgeon: SServando Salina MD;  [facility];  Service: Gynecology;  Laterality: N/A;  . ROBOTIC ASSISTED SALPINGO OOPHERECTOMY Left 06/21/2015   Procedure: ROBOTIC ASSISTED LEFT SALPINGO OOPHORECTOMY AND RIGHT SALPINGECTOMY With  Pelvic Washings;  Surgeon: SServando Salina MD;  [facility];  Service: Gynecology;  Laterality: Left;  2 1/2 hrs.  . TUBAL LIGATION     Family History  Problem Relation Age of Onset  . Breast cancer Neg Hx    Social History   Socioeconomic History  . Marital status: Married    Spouse name: Not on file  . Number of children: Not on file  . Years of education: Not on file  . Highest education level: Not on file  Occupational History  . Occupation: retired  . Occupation: part time work  Tobacco Use  . Smoking status: Former SResearch scientist (life sciences) . Smokeless tobacco: Never  Used  Vaping Use  . Vaping Use: Never used  Substance and Sexual Activity  . Alcohol use: No  . Drug use: No  . Sexual activity: Not Currently  Other Topics Concern  . Not on file  Social History Narrative  . Not on file   Social Determinants of Health   Financial Resource Strain: Low Risk   . Difficulty of Paying Living Expenses: Not hard at all  Food Insecurity: No Food Insecurity  .  Worried About Charity fundraiser in the Last Year: Never true  . Ran Out of Food in the Last Year: Never true  Transportation Needs: No Transportation Needs  . Lack of Transportation (Medical): No  . Lack of Transportation (Non-Medical): No  Physical Activity: Sufficiently Active  . Days of Exercise per Week: 6 days  . Minutes of Exercise per Session: 90 min  Stress: No Stress Concern Present  . Feeling of Stress : Not at all  Social Connections:   . Frequency of Communication with Friends and Family: Not on file  . Frequency of Social Gatherings with Friends and Family: Not on file  . Attends Religious Services: Not on file  . Active Member of Clubs or Organizations: Not on file  . Attends Archivist Meetings: Not on file  . Marital Status: Not on file    Tobacco Counseling Counseling given: Not Answered   Clinical Intake:  Pre-visit preparation completed: Yes  Pain : No/denies pain     Nutritional Status: BMI > 30  Obese Nutritional Risks: None Diabetes: Yes  How often do you need to have someone help you when you read instructions, pamphlets, or other written materials from your doctor or pharmacy?: 1 - Never What is the last grade level you completed in school?: Associate degree  Diabetic? Yes Nutrition Risk Assessment:  Has the patient had any N/V/D within the last 2 months?  No  Does the patient have any non-healing wounds?  No  Has the patient had any unintentional weight loss or weight gain?  No   Diabetes:  Is the patient diabetic?  Yes  If diabetic, was a CBG obtained today?  No  Did the patient bring in their glucometer from home?  No  How often do you monitor your CBG's? Every other day.   Financial Strains and Diabetes Management:  Are you having any financial strains with the device, your supplies or your medication? No .  Does the patient want to be seen by Chronic Care Management for management of their diabetes?  No  Would the  patient like to be referred to a Nutritionist or for Diabetic Management?  No   Diabetic Exams:  Diabetic Eye Exam: Completed 04/03/2020 Diabetic Foot Exam: Completed today  Interpreter Needed?: No  Information entered by :: NAllen LPN   Activities of Daily Living In your present state of health, do you have any difficulty performing the following activities: 10/10/2020  Hearing? N  Vision? N  Difficulty concentrating or making decisions? N  Walking or climbing stairs? N  Dressing or bathing? N  Doing errands, shopping? N  Preparing Food and eating ? N  Using the Toilet? N  In the past six months, have you accidently leaked urine? N  Do you have problems with loss of bowel control? N  Managing your Medications? N  Managing your Finances? N  Housekeeping or managing your Housekeeping? N  Some recent data might be hidden    Patient Care Team: Laurance Flatten,  Doreene Burke, FNP as PCP - General (General Practice)  Indicate any recent Medical Services you may have received from other than [facility] providers in the past year (date may be approximate).     Assessment:   This is a routine wellness examination for Leah.  Hearing/Vision screen  Hearing Screening   _0  _1  _2  _3  _4  _5  _6  _7  _8   Right ear:           Left ear:           Vision Screening Comments: Regular eye exams, [facility]  Dietary issues and exercise activities discussed: Current Exercise Habits: Home exercise routine, Type of exercise: walking;Other - see comments (swimming), Time (Minutes): > 60, Frequency (Times/Week): 6, Weekly Exercise (Minutes/Week): 0  Goals    . Patient Stated     10/10/2020, wants to get below 200 pounds    . Weight (lb) < 200 lb (90.7 kg)     08/31/2019, wants to get out of the 200's      Depression Screen PHQ 2/9 Scores 10/10/2020 02/27/2020 01/18/2020 11/22/2019 08/31/2019 08/31/2019  PHQ - 2 Score 0 0 0 0 0 0  PHQ- 9 Score - - - - 0 -    Fall Risk Fall Risk   10/10/2020 02/27/2020 02/08/2020 01/18/2020 11/22/2019  Falls in the past year? 0 0 0 0 0  Risk for fall due to : Medication side effect - - - -  Follow up Falls evaluation completed;Education provided;Falls prevention discussed - - - -    Any stairs in or around the home? Yes  If so, are there any without handrails? no Home free of loose throw rugs in walkways, pet beds, electrical cords, etc? Yes  Adequate lighting in your home to reduce risk of falls? Yes   ASSISTIVE DEVICES UTILIZED TO PREVENT FALLS:  Life alert? No  Use of a cane, walker or w/c? No  Grab bars in the bathroom? Yes  Shower chair or bench in shower? No  Elevated toilet seat or a handicapped toilet? Yes   TIMED UP AND GO:  Was the test performed? No .    Gait steady and fast without use of assistive device  Cognitive Function:     6CIT Screen 10/10/2020 08/31/2019  What Year? 0 points 0 points  What month? 0 points 0 points  What time? 0 points 0 points  Count back from 20 0 points 0 points  Months in reverse 0 points 0 points  Repeat phrase 0 points 0 points  Total Score 0 0    Immunizations Immunization History  Administered Date(s) Administered  . Fluad Quad(high Dose 65+) 10/10/2020  . Influenza, High Dose Seasonal PF 09/22/2018, 08/31/2019  . PFIZER SARS-COV-2 Vaccination 01/27/2020, 02/17/2020  . Tdap 08/30/2018    TDAP status: Up to date Flu Vaccine status: Completed at today's visit Pneumococcal vaccine status: Declined,  Education has been provided regarding the importance of this vaccine but patient still declined. Advised may receive this vaccine at local pharmacy or Health Dept. Aware to provide a copy of the vaccination record if obtained from local pharmacy or Health Dept. Verbalized acceptance and understanding.  Covid-19 vaccine status: Completed vaccines  Qualifies for Shingles Vaccine? Yes   Zostavax completed No   Shingrix Completed?: No.    Education has been provided regarding the  importance of this vaccine. Patient has been advised to call insurance company to determine out of pocket expense if they have not yet received this vaccine. Advised may  also receive vaccine at local pharmacy or Health Dept. Verbalized acceptance and understanding.  Screening Tests Health Maintenance  Topic Date Due  . HEMOGLOBIN A1C  08/07/2020  . FOOT EXAM  08/30/2020  . PNA vac Low Risk Adult (1 of 2 - PCV13) 10/10/2021 (Originally 02/27/2016)  . COLONOSCOPY  02/07/2021  . URINE MICROALBUMIN  02/26/2021  . OPHTHALMOLOGY EXAM  04/03/2021  . MAMMOGRAM  07/10/2022  . TETANUS/TDAP  08/30/2028  . INFLUENZA VACCINE  Completed  . DEXA SCAN  Completed  . COVID-19 Vaccine  Completed  . Hepatitis C Screening  Completed    Health Maintenance  Health Maintenance Due  Topic Date Due  . HEMOGLOBIN A1C  08/07/2020  . FOOT EXAM  08/30/2020    Colorectal cancer screening: Completed 02/07/2011. Repeat every 10 years Mammogram status: Completed 07/10/2020. Repeat every year Bone Density status: Completed 10/29/2018.  Lung Cancer Screening: (Low Dose CT Chest recommended if Age 56-80 years, 30 pack-year currently smoking OR have quit w/in 15years.) does not qualify.   Lung Cancer Screening Referral: no  Additional Screening:  Hepatitis C Screening: does qualify; Completed 08/31/2019  Vision Screening: Recommended annual ophthalmology exams for early detection of glaucoma and other disorders of the eye. Is the patient up to date with their annual eye exam?  Yes  Who is the provider or what is the name of the office in which the patient attends annual eye exams? [facility] If pt is not established with a provider, would they like to be referred to a provider to establish care? No .   Dental Screening: Recommended annual dental exams for proper oral hygiene  Community Resource Referral / Chronic Care Management: CRR required this visit?  No   CCM required this visit?  No      Plan:      I have personally reviewed and noted the following in the patient's chart:   . Medical and social history . Use of alcohol, tobacco or illicit drugs  . Current medications and supplements . Functional ability and status . Nutritional status . Physical activity . Advanced directives . List of other physicians . Hospitalizations, surgeries, and ER visits in previous 12 months . Vitals . Screenings to include cognitive, depression, and falls . Referrals and appointments  In addition, I have reviewed and discussed with patient certain preventive protocols, quality metrics, and best practice recommendations. A written personalized care plan for preventive services as well as general preventive health recommendations were provided to patient.     Kellie Simmering, LPN   [id number]   Nurse Notes:

## 2020-10-10 NOTE — Patient Instructions (Signed)
Leah Robinson , Thank you for taking time to come for your Medicare Wellness Visit. I appreciate your ongoing commitment to your health goals. Please review the following plan we discussed and let me know if I can assist you in the future.   Screening recommendations/referrals: Colonoscopy: completed 02/07/2011 Mammogram: completed 07/10/2020 Bone Density: completed 10/29/2018 Recommended yearly ophthalmology/optometry visit for glaucoma screening and checkup Recommended yearly dental visit for hygiene and checkup  Vaccinations: Influenza vaccine: today Pneumococcal vaccine: decline Tdap vaccine: completed 08/30/2018 Shingles vaccine: decline   Covid-19: 09/28/2020, 02/17/2020,01/27/2020  Advanced directives: Please bring a copy of your POA (Power of Attorney) and/or Living Will to your next appointment.   Conditions/risks identified: none  Next appointment: Follow up in one year for your annual wellness visit 10/16/2021 at 8:30   Preventive Care 65 Years and Older, Female Preventive care refers to lifestyle choices and visits with your health care provider that can promote health and wellness. What does preventive care include?  A yearly physical exam. This is also called an annual well check.  Dental exams once or twice a year.  Routine eye exams. Ask your health care provider how often you should have your eyes checked.  Personal lifestyle choices, including:  Daily care of your teeth and gums.  Regular physical activity.  Eating a healthy diet.  Avoiding tobacco and drug use.  Limiting alcohol use.  Practicing safe sex.  Taking low-dose aspirin every day.  Taking vitamin and mineral supplements as recommended by your health care provider. What happens during an annual well check? The services and screenings done by your health care provider during your annual well check will depend on your age, overall health, lifestyle risk factors, and family history of  disease. Counseling  Your health care provider may ask you questions about your:  Alcohol use.  Tobacco use.  Drug use.  Emotional well-being.  Home and relationship well-being.  Sexual activity.  Eating habits.  History of falls.  Memory and ability to understand (cognition).  Work and work Statistician.  Reproductive health. Screening  You may have the following tests or measurements:  Height, weight, and BMI.  Blood pressure.  Lipid and cholesterol levels. These may be checked every 5 years, or more frequently if you are over 7 years old.  Skin check.  Lung cancer screening. You may have this screening every year starting at age 53 if you have a 30-pack-year history of smoking and currently smoke or have quit within the past 15 years.  Fecal occult blood test (FOBT) of the stool. You may have this test every year starting at age 63.  Flexible sigmoidoscopy or colonoscopy. You may have a sigmoidoscopy every 5 years or a colonoscopy every 10 years starting at age 63.  Hepatitis C blood test.  Hepatitis B blood test.  Sexually transmitted disease (STD) testing.  Diabetes screening. This is done by checking your blood sugar (glucose) after you have not eaten for a while (fasting). You may have this done every 1-3 years.  Bone density scan. This is done to screen for osteoporosis. You may have this done starting at age 64.  Mammogram. This may be done every 1-2 years. Talk to your health care provider about how often you should have regular mammograms. Talk with your health care provider about your test results, treatment options, and if necessary, the need for more tests. Vaccines  Your health care provider may recommend certain vaccines, such as:  Influenza vaccine. This is recommended every  year.  Tetanus, diphtheria, and acellular pertussis (Tdap, Td) vaccine. You may need a Td booster every 10 years.  Zoster vaccine. You may need this after age  42.  Pneumococcal 13-valent conjugate (PCV13) vaccine. One dose is recommended after age 54.  Pneumococcal polysaccharide (PPSV23) vaccine. One dose is recommended after age 89. Talk to your health care provider about which screenings and vaccines you need and how often you need them. This information is not intended to replace advice given to you by your health care provider. Make sure you discuss any questions you have with your health care provider. Document Released: 01/04/2016 Document Revised: 08/27/2016 Document Reviewed: 10/09/2015 Elsevier Interactive Patient Education  2017 [place] Prevention in the Home Falls can cause injuries. They can happen to people of all ages. There are many things you can do to make your home safe and to help prevent falls. What can I do on the outside of my home?  Regularly fix the edges of walkways and driveways and fix any cracks.  Remove anything that might make you trip as you walk through a door, such as a raised step or threshold.  Trim any bushes or trees on the path to your home.  Use bright outdoor lighting.  Clear any walking paths of anything that might make someone trip, such as rocks or tools.  Regularly check to see if handrails are loose or broken. Make sure that both sides of any steps have handrails.  Any raised decks and porches should have guardrails on the edges.  Have any leaves, snow, or ice cleared regularly.  Use sand or salt on walking paths during winter.  Clean up any spills in your garage right away. This includes oil or grease spills. What can I do in the bathroom?  Use night lights.  Install grab bars by the toilet and in the tub and shower. Do not use towel bars as grab bars.  Use non-skid mats or decals in the tub or shower.  If you need to sit down in the shower, use a plastic, non-slip stool.  Keep the floor dry. Clean up any water that spills on the floor as soon as it happens.  Remove  soap buildup in the tub or shower regularly.  Attach bath mats securely with double-sided non-slip rug tape.  Do not have throw rugs and other things on the floor that can make you trip. What can I do in the bedroom?  Use night lights.  Make sure that you have a light by your bed that is easy to reach.  Do not use any sheets or blankets that are too big for your bed. They should not hang down onto the floor.  Have a firm chair that has side arms. You can use this for support while you get dressed.  Do not have throw rugs and other things on the floor that can make you trip. What can I do in the kitchen?  Clean up any spills right away.  Avoid walking on wet floors.  Keep items that you use a lot in easy-to-reach places.  If you need to reach something above you, use a strong step stool that has a grab bar.  Keep electrical cords out of the way.  Do not use floor polish or wax that makes floors slippery. If you must use wax, use non-skid floor wax.  Do not have throw rugs and other things on the floor that can make you trip. What can  I do with my stairs?  Do not leave any items on the stairs.  Make sure that there are handrails on both sides of the stairs and use them. Fix handrails that are broken or loose. Make sure that handrails are as long as the stairways.  Check any carpeting to make sure that it is firmly attached to the stairs. Fix any carpet that is loose or worn.  Avoid having throw rugs at the top or bottom of the stairs. If you do have throw rugs, attach them to the floor with carpet tape.  Make sure that you have a light switch at the top of the stairs and the bottom of the stairs. If you do not have them, ask someone to add them for you. What else can I do to help prevent falls?  Wear shoes that:  Do not have high heels.  Have rubber bottoms.  Are comfortable and fit you well.  Are closed at the toe. Do not wear sandals.  If you use a  stepladder:  Make sure that it is fully opened. Do not climb a closed stepladder.  Make sure that both sides of the stepladder are locked into place.  Ask someone to hold it for you, if possible.  Clearly mark and make sure that you can see:  Any grab bars or handrails.  First and last steps.  Where the edge of each step is.  Use tools that help you move around (mobility aids) if they are needed. These include:  Canes.  Walkers.  Scooters.  Crutches.  Turn on the lights when you go into a dark area. Replace any light bulbs as soon as they burn out.  Set up your furniture so you have a clear path. Avoid moving your furniture around.  If any of your floors are uneven, fix them.  If there are any pets around you, be aware of where they are.  Review your medicines with your doctor. Some medicines can make you feel dizzy. This can increase your chance of falling. Ask your doctor what other things that you can do to help prevent falls. This information is not intended to replace advice given to you by your health care provider. Make sure you discuss any questions you have with your health care provider. Document Released: 10/04/2009 Document Revised: 05/15/2016 Document Reviewed: 01/12/2015 Elsevier Interactive Patient Education  2017 [employer].

## 2020-10-11 LAB — CMP14+EGFR
ALT: 18 IU/L (ref 0–32)
AST: 14 IU/L (ref 0–40)
Albumin/Globulin Ratio: 1.5 (ref 1.2–2.2)
Albumin: 4.4 g/dL (ref 3.8–4.8)
Alkaline Phosphatase: 83 IU/L (ref 44–121)
BUN/Creatinine Ratio: 14 (ref 12–28)
BUN: 11 mg/dL (ref 8–27)
Bilirubin Total: 0.4 mg/dL (ref 0.0–1.2)
CO2: 25 mmol/L (ref 20–29)
Calcium: 9.8 mg/dL (ref 8.7–10.3)
Chloride: 100 mmol/L (ref 96–106)
Creatinine, Ser: 0.79 mg/dL (ref 0.57–1.00)
GFR calc Af Amer: 88 mL/min/{1.73_m2} (ref >59)
GFR calc non Af Amer: 77 mL/min/{1.73_m2} (ref >59)
Globulin, Total: 3 g/dL (ref 1.5–4.5)
Glucose: 222 mg/dL — ABNORMAL HIGH (ref 65–99)
Potassium: 4.5 mmol/L (ref 3.5–5.2)
Sodium: 137 mmol/L (ref 134–144)
Total Protein: 7.4 g/dL (ref 6.0–8.5)

## 2020-10-11 LAB — LIPID PANEL
Chol/HDL Ratio: 3.3 ratio (ref 0.0–4.4)
Cholesterol, Total: 189 mg/dL (ref 100–199)
HDL: 57 mg/dL (ref >39)
LDL Chol Calc (NIH): 117 mg/dL — ABNORMAL HIGH (ref 0–99)
Triglycerides: 85 mg/dL (ref 0–149)
VLDL Cholesterol Cal: 15 mg/dL (ref 5–40)

## 2020-10-11 LAB — CBC
Hematocrit: 39.2 % (ref 34.0–46.6)
Hemoglobin: 13 g/dL (ref 11.1–15.9)
MCH: 28.9 pg (ref 26.6–33.0)
MCHC: 33.2 g/dL (ref 31.5–35.7)
MCV: 87 fL (ref 79–97)
Platelets: 205 10*3/uL (ref 150–450)
RBC: 4.5 x10E6/uL (ref 3.77–5.28)
RDW: 12 % (ref 11.7–15.4)
WBC: 6.8 10*3/uL (ref 3.4–10.8)

## 2020-10-11 LAB — VITAMIN D 25 HYDROXY (VIT D DEFICIENCY, FRACTURES): Vit D, 25-Hydroxy: 31.5 ng/mL (ref 30.0–100.0)

## 2020-10-26 ENCOUNTER — Ambulatory Visit: Payer: Medicare PPO | Admitting: Internal Medicine

## 2020-10-26 ENCOUNTER — Encounter: Payer: Self-pay | Admitting: Internal Medicine

## 2020-10-26 ENCOUNTER — Other Ambulatory Visit: Payer: Self-pay

## 2020-10-26 VITALS — BP 130/88 | HR 77 | Ht 64.0 in | Wt 209.0 lb

## 2020-10-26 DIAGNOSIS — E1165 Type 2 diabetes mellitus with hyperglycemia: Secondary | ICD-10-CM | POA: Diagnosis not present

## 2020-10-26 DIAGNOSIS — E785 Hyperlipidemia, unspecified: Secondary | ICD-10-CM

## 2020-10-26 DIAGNOSIS — E669 Obesity, unspecified: Secondary | ICD-10-CM

## 2020-10-26 LAB — POCT GLYCOSYLATED HEMOGLOBIN (HGB A1C): Hemoglobin A1C: 9.2 % — AB (ref 4.0–5.6)

## 2020-10-26 MED ORDER — ACCU-CHEK FASTCLIX LANCETS MISC
3 refills | Status: DC
Start: 2020-10-26 — End: 2022-01-24

## 2020-10-26 MED ORDER — DAPAGLIFLOZIN PROPANEDIOL 5 MG PO TABS: 5.0000 mg | ORAL_TABLET | Freq: Every day | ORAL | 3 refills | Status: DC

## 2020-10-26 MED ORDER — ATORVASTATIN CALCIUM 10 MG PO TABS: ORAL_TABLET | ORAL | 3 refills | Status: DC

## 2020-10-26 MED ORDER — ACCU-CHEK GUIDE VI STRP
ORAL_STRIP | 3 refills | Status: DC
Start: 2020-10-26 — End: 2022-01-24

## 2020-10-26 NOTE — Progress Notes (Signed)
Patient ID: Leah Robinson, female   DOB: [birth year], 69 y.o.   MRN: [id number]   This visit occurred during the SARS-CoV-2 public health emergency.  Safety protocols were in place, including screening questions prior to the visit, additional usage of staff PPE, and extensive cleaning of exam room while observing appropriate contact time as indicated for disinfecting solutions.   HPI: Leah Robinson is a 68 y.o.-year-old female, initially referred by her [place] FNP (Dr. Glendale Chard - [facility]  Returning for follow-up for DM2, dx as prediabetes in 2016, then DM2, non-insulin-dependent, uncontrolled, without long term complications. Last visit 7 months ago.  Reviewed HbA1c levels: Lab Results  Component Value Date   HGBA1C 7.2 (A) 02/08/2020   HGBA1C 8.5 (H) 08/31/2019   HGBA1C 7.7 (A) 09/22/2018  09/05/2019: HbA1c calculated from fructosamine: 7.2% 09/22/2018: HbA1c calculated from fructosamine 6.5% 05/18/2018: HbA1c calculated from fructosamine is much better, at 6.5%. 06/30/2017: HbA1c 7.8% Reportedly, prev. in the 6-7% range.  She is off diabetic medications for now.  We tried Metformin ER 1000 mg with dinner-started 08/2019 >> stopped 09/2019 2/2 AP/constipation. She had to stop Metformin (started 2016) because of diarrhea and abdominal pain.  Pt checks sugars once a day: - am: 150-160 >> (checks after exercise): 140-160 >> 168-220 - 2h after b'fast: 118-140 >> 128-140 >> 138, 142 >> n/c - before lunch: 130-140 >> 134-141 >> n/c >> 140, 170s, 200 - 2h after lunch:126, 212 >> 121-128 >> 129-141 >> n/c - before dinner:  112-124, 160, 195 >> 130 >> 99 >> 120-150 - 2h after dinner: 118-130, 150 >> 89, 104-140 >> 118 >> n/c - bedtime: n/c >> 134, 149 >> 120 >> n/c >> 126 >> n/c - nighttime: n/c Lowest: 110 >> 99 >> 120 Highest: 180 (sweets) >> 160 >> 220  Glucometer: Accu-Chek guide   Pt's meals are: - Breakfast: toast, bacon, cereal, sandwich, ham  bisquit, boiled egg - Lunch: salad + nuts, [place] sandwich, tuna - Dinner: greens or veggies, chicken/pork/fish - Snacks: 3 (mostly at night) >> popcorn, icecream  -+ Mild CKD, last BUN/creatinine:  Lab Results  Component Value Date   BUN 11 10/10/2020   BUN 14 02/08/2020   CREATININE 0.79 10/10/2020   CREATININE 0.81 02/08/2020  06/30/2017: CMP normal, but with a glucose of 168, BUN/Cr 14/0.86 (GFR 81)  12/10/2016: 10/0.80, eGFR 89, Glu 126  -+ HL. Latest lipids: Lab Results  Component Value Date   CHOL 189 10/10/2020   HDL 57 10/10/2020   LDLCALC 117 (H) 10/10/2020   TRIG 85 10/10/2020   CHOLHDL 3.3 10/10/2020  06/30/2017: 223/102/58/145  12/10/2016: 186/83/51/118 On Lipitor 10 mg every other day >> now moved every day by PCP  - last eye exam was in 03/2020: No DR, low stage cataracts -+ Occasional numbness and tingling in her feet.  Before the coronavirus pandemic, she was going to the gym: swimming, walking, weights, Silver sneakers class-  5x a week.  [facility] -she continues.  ROS: Constitutional: no weight gain/no weight loss, no fatigue, no subjective hyperthermia, no subjective hypothermia Eyes: no blurry vision, no xerophthalmia ENT: no sore throat, no nodules palpated in neck, no dysphagia, no odynophagia, no hoarseness Cardiovascular: no CP/no SOB/no palpitations/no leg swelling Respiratory: no cough/no SOB/no wheezing Gastrointestinal: no N/no V/no D/no C/no acid reflux Musculoskeletal: no muscle aches/+ joint aches Skin: no rashes, no hair loss Neurological: no tremors/+ numbness/+ tingling/no dizziness  I reviewed pt's  medications, allergies, PMH, social hx, family hx, and changes were documented in the history of present illness. Otherwise, unchanged from my initial visit note.  Past Medical History:  Diagnosis Date  . Arthritis   . Diabetes mellitus without complication [facility])    Past Surgical History:  Procedure  Laterality Date  . DILATATION & CURRETTAGE/HYSTEROSCOPY WITH RESECTOCOPE N/A 06/21/2015   Procedure: DILATATION & CURETTAGE/HYSTEROSCOPY WITH RESECTOCOPE;  Surgeon: Servando Salina, MD;  Location: [facility];  Service: Gynecology;  Laterality: N/A;  . ROBOTIC ASSISTED SALPINGO OOPHERECTOMY Left 06/21/2015   Procedure: ROBOTIC ASSISTED LEFT SALPINGO OOPHORECTOMY AND RIGHT SALPINGECTOMY With  Pelvic Washings;  Surgeon: Servando Salina, MD;  Location: [facility];  Service: Gynecology;  Laterality: Left;  2 1/2 hrs.  . TUBAL LIGATION     Social History   Social History  . Marital status: Married    Spouse name: N/A  . Number of children: 1   Occupational History  . Retired from [employer]   Social History Main Topics  . Smoking status: Former Research scientist (life sciences), quit in 1995  . Smokeless tobacco: Never Used  . Alcohol use No  . Drug use: No   Current Outpatient Medications on File Prior to Visit  Medication Sig Dispense Refill  . ACCU-CHEK FASTCLIX LANCETS MISC Use to check sugar once daily 102 each 3  . aspirin (ASPIRIN ADULT LOW STRENGTH) 81 MG chewable tablet Chew 1 tablet (81 mg total) by mouth daily. 30 tablet 1  . atorvastatin (LIPITOR) 10 MG tablet Take 1 tablet by mouth MWF 45 tablet 1  . Blood Glucose Monitoring Suppl (ACCU-CHEK GUIDE) w/Device KIT 1 Device by Does not apply route daily. 1 kit 0  . cholecalciferol (VITAMIN D) 1000 UNITS tablet Take 1,000 Units by mouth daily.    Marland Kitchen glucose blood (ACCU-CHEK GUIDE) test strip Use as instructed to check sugar once daily 100 each 3  . Multiple Vitamin (MULTIVITAMIN WITH MINERALS) TABS tablet Take 1 tablet by mouth daily.    . vitamin B-12 (CYANOCOBALAMIN) 1000 MCG tablet Take 1,000 mcg by mouth daily.     No current facility-administered medications on file prior to visit.   No Known Allergies   Pertinent FH: Pt has FH of DM in mother, maternal uncle.  PE: BP 130/88   Pulse 77   Ht $R'5\' 4"'Pe$  (1.626 m)   Wt 209 lb (94.8 kg)   SpO2 96%   BMI 35.87  kg/m  Wt Readings from Last 3 Encounters:  10/26/20 209 lb (94.8 kg)  10/10/20 209 lb 7 oz (95 kg)  10/10/20 209 lb 6.4 oz (95 kg)   Constitutional: overweight, in NAD Eyes: PERRLA, EOMI, no exophthalmos ENT: moist mucous membranes, no thyromegaly, no cervical lymphadenopathy Cardiovascular: RRR, No MRG Respiratory: CTA B Gastrointestinal: abdomen soft, NT, ND, BS+ Musculoskeletal: no deformities, strength intact in all 4 Skin: moist, warm, no rashes Neurological: no tremor with outstretched hands, DTR normal in all 4  ASSESSMENT: 1. DM2, non-insulin-dependent, without long term complications, but with hyperglycemia  2. HL  3.  Obesity class II  PLAN:  1. Patient with relatively well-controlled diabetes, previously on Metformin, which she could not tolerate because of abdominal pain and diarrhea.  Restarted Metformin ER afterwards but she could not tolerate this, either.  At last visit, she was not checking sugars consistently but whenever she checked, they were slightly higher than goal.  Upon questioning, she was checking them after exercise..  Latest HbA1c was higher than goal at 7.2%.  We do  usually follow her weights fructosamine levels since these are more than the directly measured HbA1c levels.  We did not check this at last visit.  We continued off Metformin at that time but advised her to check her sugars more consistently and rotate the checks throughout the day. -At today's visit, sugars are higher  and she mentions that this is due to relaxing her diet and eating snacks at night after dinner.  She is determined about cutting these out.  We discussed about options for treatment.  Metformin is not an option since she had side effects for both instant release and extended release formulations.  Discussed about injectable GLP-1 receptor agonists and also insulin, but she would like to avoid injectables for now.  We can try an SGLT2 inhibitor.  Explained the mechanism of action and  expected benefits.  Also, reviewed possible side effects.  Advised her to stay well hydrated.  We will start with a low-dose Farxiga. -I also advised her to start keeping her sugar log in with comments in her log about the abnormal blood sugars - I suggested to:  Patient Instructions  Please start: - Farxiga 5 mg before b'fast  Please return in 3 months with your sugar log.  - we checked her HbA1c: 9.2% (much higher) - advised to check sugars at different times of the day - 1x a day, rotating check times - advised for yearly eye exams >> she is UTD - return to clinic in 3 months   2. HL -Reviewed latest lipid panel from 09/2020: LDL higher than goal, the rest of the fractions at goal: Lab Results  Component Value Date   CHOL 189 10/10/2020   HDL 57 10/10/2020   LDLCALC 117 (H) 10/10/2020   TRIG 85 10/10/2020   CHOLHDL 3.3 10/10/2020  -She is on Lipitor 10 g every other day -started by PCP.  The dose was recently increased to every day but she needs a prescription for this.  I sent it to the pharmacy  3.  Obesity class II -She continues to exercise at the gym -Weight was stable at last visit and again at this visit  [name], MD PhD [facility]

## 2020-10-26 NOTE — Patient Instructions (Addendum)
Please start: - Farxiga 5 mg before b'fast  Please return in 3 months with your sugar log.

## 2021-02-01 ENCOUNTER — Ambulatory Visit: Payer: Medicare PPO | Admitting: Internal Medicine

## 2021-03-26 DIAGNOSIS — K5730 Diverticulosis of large intestine without perforation or abscess without bleeding: Secondary | ICD-10-CM | POA: Diagnosis not present

## 2021-03-26 DIAGNOSIS — Z1211 Encounter for screening for malignant neoplasm of colon: Secondary | ICD-10-CM | POA: Diagnosis not present

## 2021-04-10 ENCOUNTER — Ambulatory Visit: Payer: Medicare PPO | Admitting: Nurse Practitioner

## 2021-04-10 ENCOUNTER — Other Ambulatory Visit: Payer: Self-pay

## 2021-04-10 ENCOUNTER — Ambulatory Visit
Admission: RE | Admit: 2021-04-10 | Discharge: 2021-04-10 | Disposition: A | Discharge status: Home / Self Care | Payer: Medicare PPO | Source: Ambulatory Visit | Attending: Nurse Practitioner | Admitting: Nurse Practitioner

## 2021-04-10 ENCOUNTER — Encounter: Payer: Self-pay | Admitting: Nurse Practitioner

## 2021-04-10 VITALS — BP 138/70 | HR 66 | Temp 98.0°F | Ht 64.0 in | Wt 206.4 lb

## 2021-04-10 DIAGNOSIS — Z6835 Body mass index (BMI) 35.0-35.9, adult: Secondary | ICD-10-CM | POA: Diagnosis not present

## 2021-04-10 DIAGNOSIS — G8929 Other chronic pain: Secondary | ICD-10-CM

## 2021-04-10 DIAGNOSIS — M19012 Primary osteoarthritis, left shoulder: Secondary | ICD-10-CM | POA: Diagnosis not present

## 2021-04-10 DIAGNOSIS — E1165 Type 2 diabetes mellitus with hyperglycemia: Secondary | ICD-10-CM | POA: Diagnosis not present

## 2021-04-10 DIAGNOSIS — E782 Mixed hyperlipidemia: Secondary | ICD-10-CM | POA: Diagnosis not present

## 2021-04-10 DIAGNOSIS — H6121 Impacted cerumen, right ear: Secondary | ICD-10-CM

## 2021-04-10 DIAGNOSIS — E669 Obesity, unspecified: Secondary | ICD-10-CM | POA: Diagnosis not present

## 2021-04-10 DIAGNOSIS — M25512 Pain in left shoulder: Secondary | ICD-10-CM

## 2021-04-10 LAB — POCT UA - MICROALBUMIN
Creatinine, POC: 300 mg/dL
Microalbumin Ur, POC: 80 mg/L

## 2021-04-10 NOTE — Progress Notes (Signed)
Tomasa Hose as a scribe for Arnette Felts, FNP.,have documented all relevant documentation on the behalf of Arnette Felts, FNP,as directed by  Arnette Felts, FNP while in the presence of Arnette Felts, FNP.  This visit occurred during the SARS-CoV-2 public health emergency.  Safety protocols were in place, including screening questions prior to the visit, additional usage of staff PPE, and extensive cleaning of exam room while observing appropriate contact time as indicated for disinfecting solutions.  Subjective:     Patient ID: Leah Robinson , female    DOB: [birth year] , 70 y.o.   MRN: [id number]   Chief Complaint  Patient presents with  . Diabetes    HPI  Pt is here today for f/u on diabetes, She continues to go to the [facility] she is swimming as well.  She has no other complaints today. Her colonoscopy is scheduled for June 1st. She is scheduled to see the eye doctor in July.     Diabetes She presents for her follow-up ([place]) diabetic visit. She has type 2 diabetes mellitus. Pertinent negatives for hypoglycemia include no dizziness or headaches. Pertinent negatives for diabetes include no blurred vision, no chest pain, no fatigue, no polydipsia, no polyphagia and no polyuria. There are no hypoglycemic complications. Symptoms are stable. There are no diabetic complications. There are no known risk factors for coronary artery disease. Current diabetic treatment includes diet. She is compliant with treatment all of the time. When asked about meal planning, she reported none. She has not had a previous visit with a dietitian. She rarely participates in exercise. (Morning blood sugars are 160 and better in the evening.  She is scheduled to see Dr. Lafe Garin.  ) Eye exam is not current (she is planning to schedule her eye exam).     Past Medical History:  Diagnosis Date  . Arthritis   . Diabetes mellitus without complication (HCC)      Family History  Problem Relation Age of  Onset  . Breast cancer Neg Hx      Current Outpatient Medications:  .  Accu-Chek FastClix Lancets MISC, Use to check sugar once daily, Disp: 102 each, Rfl: 3 .  aspirin (ASPIRIN ADULT LOW STRENGTH) 81 MG chewable tablet, Chew 1 tablet (81 mg total) by mouth daily., Disp: 30 tablet, Rfl: 1 .  atorvastatin (LIPITOR) 10 MG tablet, Take 1 tablet by mouth daily, Disp: 90 tablet, Rfl: 3 .  Blood Glucose Monitoring Suppl (ACCU-CHEK GUIDE) w/Device KIT, 1 Device by Does not apply route daily., Disp: 1 kit, Rfl: 0 .  cholecalciferol (VITAMIN D) 1000 UNITS tablet, Take 1,000 Units by mouth daily., Disp: , Rfl:  .  glucose blood (ACCU-CHEK GUIDE) test strip, Use as instructed to check sugar once daily, Disp: 100 each, Rfl: 3 .  Multiple Vitamin (MULTIVITAMIN WITH MINERALS) TABS tablet, Take 1 tablet by mouth daily., Disp: , Rfl:  .  vitamin B-12 (CYANOCOBALAMIN) 1000 MCG tablet, Take 1,000 mcg by mouth daily., Disp: , Rfl:  .  dapagliflozin propanediol (FARXIGA) 5 MG TABS tablet, Take 1 tablet (5 mg total) by mouth daily before breakfast. (Patient not taking: Reported on 04/10/2021), Disp: 90 tablet, Rfl: 3   No Known Allergies   Review of Systems  Constitutional: Negative.  Negative for fatigue.  HENT: Negative.        Would like her ears checked for wax  Eyes: Negative for blurred vision.  Respiratory: Negative.   Cardiovascular: Negative for chest pain.  Gastrointestinal: Negative.  Endocrine: Negative for polydipsia, polyphagia and polyuria.  Musculoskeletal: Negative.        Left shoulder pain - taking an aleve once every two weeks. She is able to use the arm.   Skin: Negative.   Neurological: Negative for dizziness and headaches.  Psychiatric/Behavioral: Negative.      Today's Vitals   04/10/21 0830  BP: 138/70  Pulse: 66  Temp: 98 F (36.7 C)  TempSrc: Oral  Weight: 206 lb 6.4 oz (93.6 kg)  Height: $Remove'5\' 4"'tfmvcPg$  (1.626 m)   Body mass index is 35.43 kg/m.  Wt Readings from Last 3  Encounters:  04/10/21 206 lb 6.4 oz (93.6 kg)  10/26/20 209 lb (94.8 kg)  10/10/20 209 lb 7 oz (95 kg)   Objective:  Physical Exam Constitutional:      General: She is not in acute distress.    Appearance: Normal appearance.  Cardiovascular:     Rate and Rhythm: Normal rate.     Pulses: Normal pulses.     Heart sounds: Normal heart sounds. No murmur heard.   Pulmonary:     Effort: Pulmonary effort is normal. No respiratory distress.     Breath sounds: Normal breath sounds. No wheezing.  Musculoskeletal:        General: No swelling or tenderness. Normal range of motion.  Skin:    General: Skin is warm and dry.     Capillary Refill: Capillary refill takes less than 2 seconds.  Neurological:     General: No focal deficit present.     Mental Status: She is alert and oriented to person, place, and time.     Cranial Nerves: No cranial nerve deficit.  Psychiatric:        Mood and Affect: Mood normal.        Behavior: Behavior normal.        Thought Content: Thought content normal.        Judgment: Judgment normal.         Assessment And Plan:     1. Type 2 diabetes mellitus with hyperglycemia, without long-term current use of insulin (HCC)  Her HgbA1c was elevated at her last visit with Dr. Renne Crigler and was started on Farxiga but she did not pick up due to the cost of $500.  I have advised her to make Dr Renne Crigler aware at her appt tomorrow.  - POCT UA - Microalbumin - BMP8+eGFR - Hemoglobin A1c  2. Mixed hyperlipidemia  Chronic, controlled  Continue with current medications - Lipid panel  3. Obesity (BMI 35.0-39.9 without comorbidity)  Chronic  Discussed healthy diet and regular exercise options   Encouraged to exercise at least 150 minutes per week with 2 days of strength training as tolerated  4. Chronic left shoulder pain  Persistent left shoulder pain causing a decrease in range of motion  Will send for shoulder xray - DG Shoulder Left; Future  5. Impacted  cerumen of right ear Removed cerumen with lighted curette   Patient was given opportunity to ask questions. Patient verbalized understanding of the plan and was able to repeat key elements of the plan. All questions were answered to their satisfaction.  Minette Brine, FNP   I, Minette Brine, FNP, have reviewed all documentation for this visit. The documentation on 04/10/21 for the exam, diagnosis, procedures, and orders are all accurate and complete.   IF YOU HAVE BEEN REFERRED TO A SPECIALIST, IT MAY TAKE 1-2 WEEKS TO SCHEDULE/PROCESS THE REFERRAL. IF YOU HAVE NOT HEARD FROM  US/SPECIALIST IN TWO WEEKS, PLEASE GIVE [place] A CALL AT [phone] X [phone].   THE PATIENT IS ENCOURAGED TO PRACTICE SOCIAL DISTANCING DUE TO THE COVID-19 PANDEMIC.

## 2021-04-10 NOTE — Patient Instructions (Addendum)
Diabetes Mellitus and Exercise Exercising regularly is important for overall health, especially for people who have diabetes mellitus. Exercising is not only about losing weight. It has many other health benefits, such as increasing muscle strength and bone density and reducing body fat and stress. This leads to improved fitness, flexibility, and endurance, all of which result in better overall health. What are the benefits of exercise if I have diabetes? Exercise has many benefits for people with diabetes. They include:  Helping to lower and control blood sugar (glucose).  Helping the body to respond better to the hormone insulin by improving insulin sensitivity.  Reducing how much insulin the body needs.  Lowering the risk for heart disease by: ? Lowering "bad" cholesterol and triglyceride levels. ? Increasing "good" cholesterol levels. ? Lowering blood pressure. ? Lowering blood glucose levels. What is my activity plan? Your health care provider or certified diabetes educator can help you make a plan for the type and frequency of exercise that works for you. This is called your activity plan. Be sure to:  Get at least 150 minutes of medium-intensity or high-intensity exercise each week. Exercises may include brisk walking, biking, or water aerobics.  Do stretching and strengthening exercises, such as yoga or weight lifting, at least 2 times a week.  Spread out your activity over at least 3 days of the week.  Get some form of physical activity each day. ? Do not go more than 2 days in a row without some kind of physical activity. ? Avoid being inactive for more than 90 minutes at a time. Take frequent breaks to walk or stretch.  Choose exercises or activities that you enjoy. Set realistic goals.  Start slowly and gradually increase your exercise intensity over time.   How do I manage my diabetes during exercise? Monitor your blood glucose  Check your blood glucose before and  after exercising. If your blood glucose is: ? 240 mg/dL (13.3 mmol/L) or higher before you exercise, check your urine for ketones. These are chemicals created by the liver. If you have ketones in your urine, do not exercise until your blood glucose returns to normal. ? 100 mg/dL (5.6 mmol/L) or lower, eat a snack containing 15-20 grams of carbohydrate. Check your blood glucose 15 minutes after the snack to make sure that your glucose level is above 100 mg/dL (5.6 mmol/L) before you start your exercise.  Know the symptoms of low blood glucose (hypoglycemia) and how to treat it. Your risk for hypoglycemia increases during and after exercise. Follow these tips and your health care provider's instructions  Keep a carbohydrate snack that is fast-acting for use before, during, and after exercise to help prevent or treat hypoglycemia.  Avoid injecting insulin into areas of the body that are going to be exercised. For example, avoid injecting insulin into: ? Your arms, when you are about to play tennis. ? Your legs, when you are about to go jogging.  Keep records of your exercise habits. Doing this can help you and your health care provider adjust your diabetes management plan as needed. Write down: ? Food that you eat before and after you exercise. ? Blood glucose levels before and after you exercise. ? The type and amount of exercise you have done.  Work with your health care provider when you start a new exercise or activity. He or she may need to: ? Make sure that the activity is safe for you. ? Adjust your insulin, other medicines, and food that   you eat.  Drink plenty of water while you exercise. This prevents loss of water (dehydration) and problems caused by a lot of heat in the body (heat stroke).   Where to find more information  American Diabetes Association: www.diabetes.org Summary  Exercising regularly is important for overall health, especially for people who have diabetes  mellitus.  Exercising has many health benefits. It increases muscle strength and bone density and reduces body fat and stress. It also lowers and controls blood glucose.  Your health care provider or certified diabetes educator can help you make an activity plan for the type and frequency of exercise that works for you.  Work with your health care provider to make sure any new activity is safe for you. Also work with your health care provider to adjust your insulin, other medicines, and the food you eat. This information is not intended to replace advice given to you by your health care provider. Make sure you discuss any questions you have with your health care provider. Document Revised: 09/05/2019 Document Reviewed: 09/05/2019 Elsevier Patient Education  2021 [place], Adult The ears produce a substance called earwax that helps keep bacteria out of the ear and protects the skin in the ear canal. Occasionally, earwax can build up in the ear and cause discomfort or hearing loss. What are the causes? This condition is caused by a buildup of earwax. Ear canals are self-cleaning. Ear wax is made in the outer part of the ear canal and generally falls out in small amounts over time. When the self-cleaning mechanism is not working, earwax builds up and can cause decreased hearing and discomfort. Attempting to clean ears with cotton swabs can push the earwax deep into the ear canal and cause decreased hearing and pain. What increases the risk? This condition is more likely to develop in people who:  Clean their ears often with cotton swabs.  Pick at their ears.  Use earplugs or in-ear headphones often, or wear hearing aids. The following factors may also make you more likely to develop this condition:  Being female.  Being of older age.  Naturally producing more earwax.  Having narrow ear canals.  Having earwax that is overly thick or sticky.  Having excess hair in  the ear canal.  Having eczema.  Being dehydrated. What are the signs or symptoms? Symptoms of this condition include:  Reduced or muffled hearing.  A feeling of fullness in the ear or feeling that the ear is plugged.  Fluid coming from the ear.  Ear pain or an itchy ear.  Ringing in the ear.  Coughing.  Balance problems.  An obvious piece of earwax that can be seen inside the ear canal. How is this diagnosed? This condition may be diagnosed based on:  Your symptoms.  Your medical history.  An ear exam. During the exam, your health care provider will look into your ear with an instrument called an otoscope. You may have tests, including a hearing test. How is this treated? This condition may be treated by:  Using ear drops to soften the earwax.  Having the earwax removed by a health care provider. The health care provider may: ? Flush the ear with water. ? Use an instrument that has a loop on the end (curette). ? Use a suction device.  Having surgery to remove the wax buildup. This may be done in severe cases. Follow these instructions at home:  Take over-the-counter and prescription medicines only as  told by your health care provider.  Do not put any objects, including cotton swabs, into your ear. You can clean the opening of your ear canal with a washcloth or facial tissue.  Follow instructions from your health care provider about cleaning your ears. Do not overclean your ears.  Drink enough fluid to keep your urine pale yellow. This will help to thin the earwax.  Keep all follow-up visits as told. If earwax builds up in your ears often or if you use hearing aids, consider seeing your health care provider for routine, preventive ear cleanings. Ask your health care provider how often you should schedule your cleanings.  If you have hearing aids, clean them according to instructions from the manufacturer and your health care provider.   Contact a health care  provider if:  You have ear pain.  You develop a fever.  You have pus or other fluid coming from your ear.  You have hearing loss.  You have ringing in your ears that does not go away.  You feel like the room is spinning (vertigo).  Your symptoms do not improve with treatment. Get help right away if:  You have bleeding from the affected ear.  You have severe ear pain. Summary  Earwax can build up in the ear and cause discomfort or hearing loss.  The most common symptoms of this condition include reduced or muffled hearing, a feeling of fullness in the ear, or feeling that the ear is plugged.  This condition may be diagnosed based on your symptoms, your medical history, and an ear exam.  This condition may be treated by using ear drops to soften the earwax or by having the earwax removed by a health care provider.  Do not put any objects, including cotton swabs, into your ear. You can clean the opening of your ear canal with a washcloth or facial tissue. This information is not intended to replace advice given to you by your health care provider. Make sure you discuss any questions you have with your health care provider. Document Revised: 03/27/2020 Document Reviewed: 03/27/2020 Elsevier Patient Education  [place].

## 2021-04-11 ENCOUNTER — Ambulatory Visit: Payer: Medicare PPO | Admitting: Internal Medicine

## 2021-04-11 ENCOUNTER — Encounter: Payer: Self-pay | Admitting: Internal Medicine

## 2021-04-11 VITALS — BP 128/88 | HR 69 | Ht 64.0 in | Wt 204.4 lb

## 2021-04-11 DIAGNOSIS — E782 Mixed hyperlipidemia: Secondary | ICD-10-CM | POA: Diagnosis not present

## 2021-04-11 DIAGNOSIS — E785 Hyperlipidemia, unspecified: Secondary | ICD-10-CM | POA: Diagnosis not present

## 2021-04-11 DIAGNOSIS — E669 Obesity, unspecified: Secondary | ICD-10-CM | POA: Diagnosis not present

## 2021-04-11 DIAGNOSIS — E1165 Type 2 diabetes mellitus with hyperglycemia: Secondary | ICD-10-CM | POA: Diagnosis not present

## 2021-04-11 LAB — LIPID PANEL
Chol/HDL Ratio: 3.6 ratio (ref 0.0–4.4)
Cholesterol, Total: 197 mg/dL (ref 100–199)
HDL: 54 mg/dL (ref >39)
LDL Chol Calc (NIH): 125 mg/dL — ABNORMAL HIGH (ref 0–99)
Triglycerides: 102 mg/dL (ref 0–149)
VLDL Cholesterol Cal: 18 mg/dL (ref 5–40)

## 2021-04-11 LAB — HEMOGLOBIN A1C
Est. average glucose Bld gHb Est-mCnc: 206 mg/dL
Hgb A1c MFr Bld: 8.8 % — ABNORMAL HIGH (ref 4.8–5.6)

## 2021-04-11 LAB — BMP8+EGFR
BUN/Creatinine Ratio: 15 (ref 12–28)
BUN: 12 mg/dL (ref 8–27)
CO2: 22 mmol/L (ref 20–29)
Calcium: 9.4 mg/dL (ref 8.7–10.3)
Chloride: 102 mmol/L (ref 96–106)
Creatinine, Ser: 0.82 mg/dL (ref 0.57–1.00)
Glucose: 204 mg/dL — ABNORMAL HIGH (ref 65–99)
Potassium: 4.6 mmol/L (ref 3.5–5.2)
Sodium: 139 mmol/L (ref 134–144)
eGFR: 77 mL/min/{1.73_m2} (ref >59)

## 2021-04-11 MED ORDER — ATORVASTATIN CALCIUM 10 MG PO TABS: ORAL_TABLET | ORAL | 3 refills | Status: DC

## 2021-04-11 NOTE — Progress Notes (Signed)
Patient ID: Leah Robinson, female   DOB: January 18, 1951, 70 y.o.   MRN: [id number]   This visit occurred during the SARS-CoV-2 public health emergency.  Safety protocols were in place, including screening questions prior to the visit, additional usage of staff PPE, and extensive cleaning of exam room while observing appropriate contact time as indicated for disinfecting solutions.   HPI: Leah Robinson is a 70 y.o.-year-old female, initially referred by her Eek FNP (Dr. Glendale Chard - [facility]  Returning for follow-up for DM2, dx as prediabetes in 2016, then DM2, non-insulin-dependent, uncontrolled, without long term complications. Last visit 5 months ago.  Interim history: At this visit, she tells me that she was not able to obtain [place] since this was too expensive. At this visit, she denies new sxs. Her sugars have been slightly better.  Reviewed HbA1c levels: Lab Results  Component Value Date   HGBA1C 8.8 (H) 04/10/2021   HGBA1C 9.2 (A) 10/26/2020   HGBA1C 7.2 (A) 02/08/2020  09/05/2019: HbA1c calculated from fructosamine: 7.2% 09/22/2018: HbA1c calculated from fructosamine 6.5% 05/18/2018: HbA1c calculated from fructosamine is much better, at 6.5%. 06/30/2017: HbA1c 7.8% Reportedly, prev. in the 6-7% range.  She is not on any diabetic medications now. In 10/2020, I sent Wilder Glade to her pharmacy but this was not affordable. We tried Metformin ER 1000 mg with dinner-started 08/2019 >> stopped 09/2019 2/2 AP/constipation. She had to stop Metformin (started 2016) because of diarrhea and abdominal pain.  Pt checks sugars once a day: - am: 150-160 >> (checks after exercise): 140-160 >> 168-220 >> 148-201 - 2h after b'fast: 118-140 >> 128-140 >> 138, 142 >> n/c >> 140 - before lunch: 130-140 >> 134-141 >> n/c >> 140, 170s, 200 >> 122-155 - 2h after lunch:126, 212 >> 121-128 >> 129-141 >> n/c >> 112-142 - before dinner:  112-124, 160, 195 >> 130 >> 99 >>  120-150 >> 121-132 - 2h after dinner: 118-130, 150 >> 89, 104-140 >> 118 >> n/c - bedtime: n/c >> 134, 149 >> 120 >> n/c >> 126 >> n/c - nighttime: n/c Lowest: 110 >> 99 >> 120 >> 112 Highest: 180 (sweets) >> 160 >> 220 >> 201  Glucometer: Accu-Chek guide   Pt's meals are: - Breakfast: toast, bacon, cereal, sandwich, ham bisquit, boiled egg - Lunch: salad + nuts, [place] sandwich, tuna - Dinner: greens or veggies, chicken/pork/fish - at ~6:30 pm  - Snacks: 3 (mostly at night) >> popcorn, icecream, PB crackers  -+ Mild CKD, last BUN/creatinine:  Lab Results  Component Value Date   BUN 12 04/10/2021   BUN 11 10/10/2020   CREATININE 0.82 04/10/2021   CREATININE 0.79 10/10/2020  06/30/2017: CMP normal, but with a glucose of 168, BUN/Cr 14/0.86 (GFR 81)  12/10/2016: 10/0.80, eGFR 89, Glu 126  -+ HL. Latest lipids: Lab Results  Component Value Date   CHOL 197 04/10/2021   HDL 54 04/10/2021   LDLCALC 125 (H) 04/10/2021   TRIG 102 04/10/2021   CHOLHDL 3.6 04/10/2021  06/30/2017: 223/102/58/145  12/10/2016: 186/83/51/118 On Lipitor 10 mg qod >> ran out 2 weeks before labs.  - last eye exam was in 03/2020: No DR, low stage cataracts  -+ Occasional numbness and tingling in her feet.  Before the coronavirus pandemic, she was going to the gym: swimming, walking, weights, Silver sneakers class-  5x a week.  [facility]  And  ROS: Constitutional: no weight gain/no weight loss, no fatigue, no subjective  hyperthermia, no subjective hypothermia Eyes: no blurry vision, no xerophthalmia ENT: no sore throat, no nodules palpated in neck, no dysphagia, no odynophagia, no hoarseness Cardiovascular: no CP/no SOB/no palpitations/no leg swelling Respiratory: no cough/no SOB/no wheezing Gastrointestinal: no N/no V/no D/no C/no acid reflux Musculoskeletal: no muscle aches/+ joint aches Skin: no rashes, no hair loss Neurological: no tremors/+ numbness/+ tingling/no  dizziness  I reviewed pt's medications, allergies, PMH, social hx, family hx, and changes were documented in the history of present illness. Otherwise, unchanged from my initial visit note.  Past Medical History:  Diagnosis Date  . Arthritis   . Diabetes mellitus without complication [facility])    Past Surgical History:  Procedure Laterality Date  . DILATATION & CURRETTAGE/HYSTEROSCOPY WITH RESECTOCOPE N/A 06/21/2015   Procedure: DILATATION & CURETTAGE/HYSTEROSCOPY WITH RESECTOCOPE;  Surgeon: Servando Salina, MD;  Location: [facility];  Service: Gynecology;  Laterality: N/A;  . ROBOTIC ASSISTED SALPINGO OOPHERECTOMY Left 06/21/2015   Procedure: ROBOTIC ASSISTED LEFT SALPINGO OOPHORECTOMY AND RIGHT SALPINGECTOMY With  Pelvic Washings;  Surgeon: Servando Salina, MD;  Location: [facility];  Service: Gynecology;  Laterality: Left;  2 1/2 hrs.  . TUBAL LIGATION     Social History   Social History  . Marital status: Married    Spouse name: N/A  . Number of children: 1   Occupational History  . Retired from [employer]   Social History Main Topics  . Smoking status: Former Research scientist (life sciences), quit in 1995  . Smokeless tobacco: Never Used  . Alcohol use No  . Drug use: No   Current Outpatient Medications on File Prior to Visit  Medication Sig Dispense Refill  . Accu-Chek FastClix Lancets MISC Use to check sugar once daily 102 each 3  . aspirin (ASPIRIN ADULT LOW STRENGTH) 81 MG chewable tablet Chew 1 tablet (81 mg total) by mouth daily. 30 tablet 1  . atorvastatin (LIPITOR) 10 MG tablet Take 1 tablet by mouth daily 90 tablet 3  . Blood Glucose Monitoring Suppl (ACCU-CHEK GUIDE) w/Device KIT 1 Device by Does not apply route daily. 1 kit 0  . cholecalciferol (VITAMIN D) 1000 UNITS tablet Take 1,000 Units by mouth daily.    . dapagliflozin propanediol (FARXIGA) 5 MG TABS tablet Take 1 tablet (5 mg total) by mouth daily before breakfast. (Patient not taking: Reported on 04/10/2021) 90 tablet 3  . glucose blood  (ACCU-CHEK GUIDE) test strip Use as instructed to check sugar once daily 100 each 3  . Multiple Vitamin (MULTIVITAMIN WITH MINERALS) TABS tablet Take 1 tablet by mouth daily.    . vitamin B-12 (CYANOCOBALAMIN) 1000 MCG tablet Take 1,000 mcg by mouth daily.     No current facility-administered medications on file prior to visit.   No Known Allergies   Pertinent FH: Pt has FH of DM in mother, maternal uncle.  PE: BP 128/88 (BP Location: Left Arm, Patient Position: Sitting, Cuff Size: Normal)   Pulse 69   Ht $R'5\' 4"'Sb$  (1.626 m)   Wt 204 lb 6.4 oz (92.7 kg)   SpO2 97%   BMI 35.09 kg/m  Wt Readings from Last 3 Encounters:  04/11/21 204 lb 6.4 oz (92.7 kg)  04/10/21 206 lb 6.4 oz (93.6 kg)  10/26/20 209 lb (94.8 kg)   Constitutional: overweight, in NAD Eyes: PERRLA, EOMI, no exophthalmos ENT: moist mucous membranes, no thyromegaly, no cervical lymphadenopathy Cardiovascular: RRR, No MRG Respiratory: CTA B Gastrointestinal: abdomen soft, NT, ND, BS+ Musculoskeletal: no deformities, strength intact in all 4 Skin: moist, warm, no rashes  Neurological: no tremor with outstretched hands, DTR normal in all 4  ASSESSMENT: 1. DM2, non-insulin-dependent, without long term complications, but with hyperglycemia  2. HL  3.  Obesity class II  PLAN:  1. Patient with relatively well-controlled diabetes, previously on metformin IR and then ER, which she did not tolerate due to abdominal pain and diarrhea.  At last visit HbA1c was much higher, at 9.2%, increased from 7.2%.  Sugars were also higher due to relaxing her diet and eating snacks after dinner.  We discussed about cutting these out.  I also advised her to  write down possible reasons for the unusual blood sugars in her log.  I suggested an SGLT2 inhibitor as she wanted to avoid injectables.  I sent the prescription for Farxiga to her pharmacy, however, she was not able to obtain due to price. -Most recent HbA1c from 2 days ago was better, at  8.8%, decreased from 9.2%, but still above target. -Since last visit, sugars have been slightly better, but they are still elevated in the morning, up to low 200s. Later in the day, almost all of them are at goal.  Upon questioning, she continues to snack at night after dinner.  She has been around 6-6:30 PM and goes to bed around 10-11 PM.  She is wondering whether she should try to move dinner earlier.  I advised her not to do so, since this will most likely push her to eat more snacks.  We did discuss about dinner in general and I recommended lighter meals (maybe avoiding red meat and other fatty foods and fried foods.  I also advised her to try to brush her teeth after dinner and not to snack on anything after this meal.  She can definitely drink water, but ideally she would have no solid foods.  She cannot do this, she has not on vegetables. -I advised her to let me know how this works for her.  It is not working, we have the option of adding a GLP-1 receptor agonist, although I believe this is quite expensive for her;  intermediate or long-acting insulin at night; or a low-dose extended release glipizide before dinner, although this is not ideal.  Actos is another option, but I would like to avoid that due to weight gain and other possible side effects. - I suggested to:  Patient Instructions  Stop fried food and reduce red meat and fatty foods.  Stop snacking after dinner.  Let me know if the sugars in am do not improve.  Please increase Lipitor 10 mg to daily.  Please return in 3 months with your sugar log.  - advised to check sugars at different times of the day - 1x a day, rotating check times - advised for yearly eye exams >> she is UTD - return to clinic in 3 months   2. HL -Reviewed latest lipid panel from 03/2021: LDL above target: Lab Results  Component Value Date   CHOL 197 04/10/2021   HDL 54 04/10/2021   LDLCALC 125 (H) 04/10/2021   TRIG 102 04/10/2021   CHOLHDL 3.6  04/10/2021  -She is on Lipitor 10 mg every other daily- tolerated well -At last visit, she was telling me that PCP recommended to take it every day, but she did not have a prescription.  I sent this to her pharmacy but she tells me now that pharmacy did not get it.  I sent it again to the pharmacy and advised her to increase the frequency  of Lipitor to daily since her LDL increased and it is above goal.  3.  Obesity class II -She continues to exercise at the gym -Weight was stable at last visit, now lost 5 pounds since last visit -Recommended dietary changes (see above).  [name], MD PhD [facility] Endocrinology

## 2021-04-11 NOTE — Patient Instructions (Addendum)
Stop fried food and reduce red meat and fatty foods.  Stop snacking after dinner.  Let me know if the sugars in am do not improve.  Please increase Lipitor 10 mg to daily.  Please return in 3 months with your sugar log.

## 2021-05-22 DIAGNOSIS — K635 Polyp of colon: Secondary | ICD-10-CM | POA: Diagnosis not present

## 2021-05-22 DIAGNOSIS — K5730 Diverticulosis of large intestine without perforation or abscess without bleeding: Secondary | ICD-10-CM | POA: Diagnosis not present

## 2021-05-22 DIAGNOSIS — D124 Benign neoplasm of descending colon: Secondary | ICD-10-CM | POA: Diagnosis not present

## 2021-05-22 DIAGNOSIS — Z1211 Encounter for screening for malignant neoplasm of colon: Secondary | ICD-10-CM | POA: Diagnosis not present

## 2021-05-22 LAB — HM COLONOSCOPY

## 2021-05-23 ENCOUNTER — Encounter: Payer: Self-pay | Admitting: Nurse Practitioner

## 2021-05-27 ENCOUNTER — Encounter: Payer: Self-pay | Admitting: Nurse Practitioner

## 2021-06-06 ENCOUNTER — Other Ambulatory Visit: Payer: Self-pay | Admitting: Internal Medicine

## 2021-06-06 DIAGNOSIS — Z1231 Encounter for screening mammogram for malignant neoplasm of breast: Secondary | ICD-10-CM

## 2021-07-12 ENCOUNTER — Ambulatory Visit: Payer: Medicare PPO | Admitting: Internal Medicine

## 2021-08-06 ENCOUNTER — Other Ambulatory Visit: Payer: Self-pay

## 2021-08-06 ENCOUNTER — Ambulatory Visit
Admission: RE | Admit: 2021-08-06 | Discharge: 2021-08-06 | Disposition: A | Discharge status: Home / Self Care | Payer: Medicare PPO | Source: Ambulatory Visit | Attending: Internal Medicine | Admitting: Internal Medicine

## 2021-08-06 DIAGNOSIS — Z1231 Encounter for screening mammogram for malignant neoplasm of breast: Secondary | ICD-10-CM | POA: Diagnosis not present

## 2021-08-16 DIAGNOSIS — E119 Type 2 diabetes mellitus without complications: Secondary | ICD-10-CM | POA: Diagnosis not present

## 2021-08-16 LAB — HM DIABETES EYE EXAM

## 2021-09-06 ENCOUNTER — Ambulatory Visit: Payer: Medicare PPO | Admitting: Internal Medicine

## 2021-10-08 ENCOUNTER — Telehealth: Payer: Self-pay | Admitting: Nurse Practitioner

## 2021-10-08 NOTE — Telephone Encounter (Signed)
Left message asking pt to call me @ [phone].  I wanted to see if patient could r/s 10/26 appt to 11/2

## 2021-10-16 ENCOUNTER — Ambulatory Visit: Payer: Medicare PPO

## 2021-10-16 ENCOUNTER — Encounter: Payer: Medicare PPO | Admitting: Nurse Practitioner

## 2021-10-23 ENCOUNTER — Ambulatory Visit (INDEPENDENT_AMBULATORY_CARE_PROVIDER_SITE_OTHER): Payer: Medicare PPO

## 2021-10-23 VITALS — Ht 65.0 in | Wt 210.0 lb

## 2021-10-23 DIAGNOSIS — Z0000 Encounter for general adult medical examination without abnormal findings: Secondary | ICD-10-CM

## 2021-10-23 NOTE — Progress Notes (Signed)
I connected with Leah Robinson today by telephone and verified that I am speaking with the correct person using two identifiers. Location patient: home Location provider: work Persons participating in the virtual visit: Leah Robinson, Valdemar Mcclenahan LPN.   I discussed the limitations, risks, security and privacy concerns of performing an evaluation and management service by telephone and the availability of in person appointments. I also discussed with the patient that there may be a patient responsible charge related to this service. The patient expressed understanding and verbally consented to this telephonic visit.    Interactive audio and video telecommunications were attempted between this provider and patient, however failed, due to patient having technical difficulties OR patient did not have access to video capability.  We continued and completed visit with audio only.     Vital signs may be patient reported or missing.  Subjective:   Leah Robinson is a 70 y.o. female who presents for Medicare Annual (Subsequent) preventive examination.  Review of Systems     Cardiac Risk Factors include: advanced age (>57men, >63 women);diabetes mellitus;obesity (BMI >30kg/m2)     Objective:    Today's Vitals   10/23/21 0826  Weight: 210 lb (95.3 kg)  Height: $Remove'5\' 5"'brCxtcb$  (1.651 m)   Body mass index is 34.95 kg/m.  Advanced Directives 10/23/2021 10/10/2020 01/10/2020 08/31/2019 06/21/2015 06/15/2015  Does Patient Have a Medical Advance Directive? Yes Yes Yes Yes No No  Type of Paramedic of [place];Living will [place];Living will Healthcare Power of [place];Living will - -  Does patient want to make changes to medical advance directive? - - No - Patient declined - - -  Copy of [place] in Chart? No - copy requested No - copy requested - No - copy requested - -  Would patient like information on  creating a medical advance directive? - - - - - Yes - Educational materials given    Current Medications (verified) Outpatient Encounter Medications as of 10/23/2021  Medication Sig   Accu-Chek FastClix Lancets MISC Use to check sugar once daily   aspirin (ASPIRIN ADULT LOW STRENGTH) 81 MG chewable tablet Chew 1 tablet (81 mg total) by mouth daily.   atorvastatin (LIPITOR) 10 MG tablet Take 1 tablet by mouth daily   Blood Glucose Monitoring Suppl (ACCU-CHEK GUIDE) w/Device KIT 1 Device by Does not apply route daily.   cholecalciferol (VITAMIN D) 1000 UNITS tablet Take 1,000 Units by mouth daily.   glucose blood (ACCU-CHEK GUIDE) test strip Use as instructed to check sugar once daily   Multiple Vitamin (MULTIVITAMIN WITH MINERALS) TABS tablet Take 1 tablet by mouth daily.   No facility-administered encounter medications on file as of 10/23/2021.    Allergies (verified) Patient has no known allergies.   History: Past Medical History:  Diagnosis Date   Arthritis    Diabetes mellitus without complication ([place])    Past Surgical History:  Procedure Laterality Date   DILATATION & CURRETTAGE/HYSTEROSCOPY WITH RESECTOCOPE N/A 06/21/2015   Procedure: DILATATION & CURETTAGE/HYSTEROSCOPY WITH RESECTOCOPE;  Surgeon: Servando Salina, MD;  Location: [facility];  Service: Gynecology;  Laterality: N/A;   ROBOTIC ASSISTED SALPINGO OOPHERECTOMY Left 06/21/2015   Procedure: ROBOTIC ASSISTED LEFT SALPINGO OOPHORECTOMY AND RIGHT SALPINGECTOMY With  Pelvic Washings;  Surgeon: Servando Salina, MD;  Location: [facility];  Service: Gynecology;  Laterality: Left;  2 1/2 hrs.   TUBAL LIGATION     Family History  Problem Relation Age of Onset  Breast cancer Neg Hx    Social History   Socioeconomic History   Marital status: Married    Spouse name: Not on file   Number of children: Not on file   Years of education: Not on file   Highest education level: Not on file  Occupational History   Occupation:  retired   Occupation: part time work  Tobacco Use   Smoking status: Former   Smokeless tobacco: Never  Scientific laboratory technician Use: Never used  Substance and Sexual Activity   Alcohol use: No   Drug use: No   Sexual activity: Not Currently  Other Topics Concern   Not on file  Social History Narrative   Not on file   Social Determinants of Health   Financial Resource Strain: Low Risk    Difficulty of Paying Living Expenses: Not hard at all  Food Insecurity: No Food Insecurity   Worried About Charity fundraiser in the Last Year: Never true   Arboriculturist in the Last Year: Never true  Transportation Needs: No Transportation Needs   Lack of Transportation (Medical): No   Lack of Transportation (Non-Medical): No  Physical Activity: Sufficiently Active   Days of Exercise per Week: 5 days   Minutes of Exercise per Session: 90 min  Stress: No Stress Concern Present   Feeling of Stress : Not at all  Social Connections: Not on file    Tobacco Counseling Counseling given: Not Answered   Clinical Intake:  Pre-visit preparation completed: Yes  Pain : No/denies pain     Nutritional Status: BMI > 30  Obese Nutritional Risks: None Diabetes: Yes  How often do you need to have someone help you when you read instructions, pamphlets, or other written materials from your doctor or pharmacy?: 1 - Never What is the last grade level you completed in school?: 76yrs college  Diabetic? Yes Nutrition Risk Assessment:  Has the patient had any N/V/D within the last 2 months?  No  Does the patient have any non-healing wounds?  No  Has the patient had any unintentional weight loss or weight gain?  No   Diabetes:  Is the patient diabetic?  Yes  If diabetic, was a CBG obtained today?  No  Did the patient bring in their glucometer from home?  No  How often do you monitor your CBG's? daily.   Financial Strains and Diabetes Management:  Are you having any financial strains with the  device, your supplies or your medication? No .  Does the patient want to be seen by Chronic Care Management for management of their diabetes?  No  Would the patient like to be referred to a Nutritionist or for Diabetic Management?  No   Diabetic Exams:  Diabetic Eye Exam: Completed 08/16/2021 Diabetic Foot Exam: Overdue, Pt has been advised about the importance in completing this exam. Pt is scheduled for diabetic foot exam on next appointment.   Interpreter Needed?: No  Information entered by :: NAllen LPN   Activities of Daily Living In your present state of health, do you have any difficulty performing the following activities: 10/23/2021  Hearing? N  Vision? N  Difficulty concentrating or making decisions? N  Walking or climbing stairs? N  Dressing or bathing? N  Doing errands, shopping? N  Preparing Food and eating ? N  Using the Toilet? N  In the past six months, have you accidently leaked urine? N  Do you have problems with loss  of bowel control? N  Managing your Medications? N  Managing your Finances? N  Housekeeping or managing your Housekeeping? N  Some recent data might be hidden    Patient Care Team: Minette Brine, FNP as PCP - General (General Practice)  Indicate any recent Medical Services you may have received from other than [facility] providers in the past year (date may be approximate).     Assessment:   This is a routine wellness examination for Leah.  Hearing/Vision screen No results found.  Dietary issues and exercise activities discussed: Current Exercise Habits: Home exercise routine, Type of exercise: treadmill;walking;strength training/weights (water aerobics), Time (Minutes): > 60, Frequency (Times/Week): 5, Weekly Exercise (Minutes/Week): 0   Goals Addressed             This Visit's Progress    Patient Stated       10/23/2021, wants to eat better for A1C       Depression Screen PHQ 2/9 Scores 10/23/2021 10/10/2020 02/27/2020 01/18/2020  11/22/2019 08/31/2019 08/31/2019  PHQ - 2 Score 0 0 0 0 0 0 0  PHQ- 9 Score - - - - - 0 -    Fall Risk Fall Risk  10/23/2021 10/10/2020 02/27/2020 02/08/2020 01/18/2020  Falls in the past year? 0 0 0 0 0  Risk for fall due to : No Fall Risks Medication side effect - - -  Follow up Falls evaluation completed;Education provided;Falls prevention discussed Falls evaluation completed;Education provided;Falls prevention discussed - - -    FALL RISK PREVENTION PERTAINING TO THE HOME:  Any stairs in or around the home? Yes  If so, are there any without handrails? No  Home free of loose throw rugs in walkways, pet beds, electrical cords, etc? Yes  Adequate lighting in your home to reduce risk of falls? Yes   ASSISTIVE DEVICES UTILIZED TO PREVENT FALLS:  Life alert? No  Use of a cane, walker or w/c? No  Grab bars in the bathroom? Yes  Shower chair or bench in shower? Yes  Elevated toilet seat or a handicapped toilet? Yes   TIMED UP AND GO:  Was the test performed? No .      Cognitive Function:     6CIT Screen 10/23/2021 10/10/2020 08/31/2019  What Year? 0 points 0 points 0 points  What month? 0 points 0 points 0 points  What time? 0 points 0 points 0 points  Count back from 20 0 points 0 points 0 points  Months in reverse 2 points 0 points 0 points  Repeat phrase 0 points 0 points 0 points  Total Score 2 0 0    Immunizations Immunization History  Administered Date(s) Administered   Fluad Quad(high Dose 65+) 10/10/2020   Influenza, High Dose Seasonal PF 09/22/2018, 08/31/2019   PFIZER(Purple Top)SARS-COV-2 Vaccination 01/27/2020, 02/17/2020, 09/28/2020   Tdap 08/30/2018    TDAP status: Up to date  Flu Vaccine status: Due, Education has been provided regarding the importance of this vaccine. Advised may receive this vaccine at local pharmacy or Health Dept. Aware to provide a copy of the vaccination record if obtained from local pharmacy or Health Dept. Verbalized acceptance and  understanding.  Pneumococcal vaccine status: Declined,  Education has been provided regarding the importance of this vaccine but patient still declined. Advised may receive this vaccine at local pharmacy or Health Dept. Aware to provide a copy of the vaccination record if obtained from local pharmacy or Health Dept. Verbalized acceptance and understanding.   Covid-19 vaccine status: Completed vaccines  Qualifies for Shingles Vaccine? Yes   Zostavax completed No   Shingrix Completed?: No.    Education has been provided regarding the importance of this vaccine. Patient has been advised to call insurance company to determine out of pocket expense if they have not yet received this vaccine. Advised may also receive vaccine at local pharmacy or Health Dept. Verbalized acceptance and understanding.  Screening Tests Health Maintenance  Topic Date Due   Pneumonia Vaccine 30+ Years old (1 - PCV) Never done   Zoster Vaccines- Shingrix (1 of 2) Never done   COVID-19 Vaccine (4 - Booster for Pfizer series) 11/23/2020   OPHTHALMOLOGY EXAM  04/03/2021   INFLUENZA VACCINE  07/22/2021   FOOT EXAM  10/10/2021   HEMOGLOBIN A1C  10/10/2021   URINE MICROALBUMIN  04/10/2022   MAMMOGRAM  08/07/2023   TETANUS/TDAP  08/30/2028   COLONOSCOPY (Pts 45-72yrs Insurance coverage will need to be confirmed)  05/24/2031   DEXA SCAN  Completed   Hepatitis C Screening  Completed   HPV VACCINES  Aged Out    Health Maintenance  Health Maintenance Due  Topic Date Due   Pneumonia Vaccine 49+ Years old (1 - PCV) Never done   Zoster Vaccines- Shingrix (1 of 2) Never done   COVID-19 Vaccine (4 - Booster for Pfizer series) 11/23/2020   OPHTHALMOLOGY EXAM  04/03/2021   INFLUENZA VACCINE  07/22/2021   FOOT EXAM  10/10/2021   HEMOGLOBIN A1C  10/10/2021    Colorectal cancer screening: No longer required.   Mammogram status: Completed 08/06/2021. Repeat every year  Bone Density status: Completed 10/29/2018.   Lung  Cancer Screening: (Low Dose CT Chest recommended if Age 64-80 years, 30 pack-year currently smoking OR have quit w/in 15years.) does not qualify.   Lung Cancer Screening Referral: no  Additional Screening:  Hepatitis C Screening: does qualify; Completed 08/31/2019  Vision Screening: Recommended annual ophthalmology exams for early detection of glaucoma and other disorders of the eye. Is the patient up to date with their annual eye exam?  Yes  Who is the provider or what is the name of the office in which the patient attends annual eye exams? [facility] If pt is not established with a provider, would they like to be referred to a provider to establish care? No .   Dental Screening: Recommended annual dental exams for proper oral hygiene  Community Resource Referral / Chronic Care Management: CRR required this visit?  No   CCM required this visit?  No      Plan:     I have personally reviewed and noted the following in the patient's chart:   Medical and social history Use of alcohol, tobacco or illicit drugs  Current medications and supplements including opioid prescriptions.  Functional ability and status Nutritional status Physical activity Advanced directives List of other physicians Hospitalizations, surgeries, and ER visits in previous 12 months Vitals Screenings to include cognitive, depression, and falls Referrals and appointments  In addition, I have reviewed and discussed with patient certain preventive protocols, quality metrics, and best practice recommendations. A written personalized care plan for preventive services as well as general preventive health recommendations were provided to patient.     Kellie Simmering, LPN   [id number]   Nurse Notes: none

## 2021-10-23 NOTE — Patient Instructions (Signed)
Ms. Leah Robinson , Thank you for taking time to come for your Medicare Wellness Visit. I appreciate your ongoing commitment to your health goals. Please review the following plan we discussed and let me know if I can assist you in the future.   Screening recommendations/referrals: Colonoscopy: not required Mammogram: completed 08/06/2021 Bone Density: completed 10/29/2018 Recommended yearly ophthalmology/optometry visit for glaucoma screening and checkup Recommended yearly dental visit for hygiene and checkup  Vaccinations: Influenza vaccine: due Pneumococcal vaccine: decline Tdap vaccine: completed 08/30/2018, due 08/30/2028 Shingles vaccine: decline   Covid-19:09/25/2021, 04/24/2021, 09/28/2020, 02/17/2020, 01/27/2020  Advanced directives: Please bring a copy of your POA (Power of Attorney) and/or Living Will to your next appointment.   Conditions/risks identified: none  Next appointment: Follow up in one year for your annual wellness visit    Preventive Care 65 Years and Older, Female Preventive care refers to lifestyle choices and visits with your health care provider that can promote health and wellness. What does preventive care include? A yearly physical exam. This is also called an annual well check. Dental exams once or twice a year. Routine eye exams. Ask your health care provider how often you should have your eyes checked. Personal lifestyle choices, including: Daily care of your teeth and gums. Regular physical activity. Eating a healthy diet. Avoiding tobacco and drug use. Limiting alcohol use. Practicing safe sex. Taking low-dose aspirin every day. Taking vitamin and mineral supplements as recommended by your health care provider. What happens during an annual well check? The services and screenings done by your health care provider during your annual well check will depend on your age, overall health, lifestyle risk factors, and family history of disease. Counseling  Your  health care provider may ask you questions about your: Alcohol use. Tobacco use. Drug use. Emotional well-being. Home and relationship well-being. Sexual activity. Eating habits. History of falls. Memory and ability to understand (cognition). Work and work Statistician. Reproductive health. Screening  You may have the following tests or measurements: Height, weight, and BMI. Blood pressure. Lipid and cholesterol levels. These may be checked every 5 years, or more frequently if you are over 31 years old. Skin check. Lung cancer screening. You may have this screening every year starting at age 21 if you have a 30-pack-year history of smoking and currently smoke or have quit within the past 15 years. Fecal occult blood test (FOBT) of the stool. You may have this test every year starting at age 13. Flexible sigmoidoscopy or colonoscopy. You may have a sigmoidoscopy every 5 years or a colonoscopy every 10 years starting at age 43. Hepatitis C blood test. Hepatitis B blood test. Sexually transmitted disease (STD) testing. Diabetes screening. This is done by checking your blood sugar (glucose) after you have not eaten for a while (fasting). You may have this done every 1-3 years. Bone density scan. This is done to screen for osteoporosis. You may have this done starting at age 44. Mammogram. This may be done every 1-2 years. Talk to your health care provider about how often you should have regular mammograms. Talk with your health care provider about your test results, treatment options, and if necessary, the need for more tests. Vaccines  Your health care provider may recommend certain vaccines, such as: Influenza vaccine. This is recommended every year. Tetanus, diphtheria, and acellular pertussis (Tdap, Td) vaccine. You may need a Td booster every 10 years. Zoster vaccine. You may need this after age 33. Pneumococcal 13-valent conjugate (PCV13) vaccine. One dose is  recommended after age  76. Pneumococcal polysaccharide (PPSV23) vaccine. One dose is recommended after age 10. Talk to your health care provider about which screenings and vaccines you need and how often you need them. This information is not intended to replace advice given to you by your health care provider. Make sure you discuss any questions you have with your health care provider. Document Released: 01/04/2016 Document Revised: 08/27/2016 Document Reviewed: 10/09/2015 Elsevier Interactive Patient Education  2017 [employer] [employer] Prevention in the Home Falls can cause injuries. They can happen to people of all ages. There are many things you can do to make your home safe and to help prevent falls. What can I do on the outside of my home? Regularly fix the edges of walkways and driveways and fix any cracks. Remove anything that might make you trip as you walk through a door, such as a raised step or threshold. Trim any bushes or trees on the path to your home. Use bright outdoor lighting. Clear any walking paths of anything that might make someone trip, such as rocks or tools. Regularly check to see if handrails are loose or broken. Make sure that both sides of any steps have handrails. Any raised decks and porches should have guardrails on the edges. Have any leaves, snow, or ice cleared regularly. Use sand or salt on walking paths during winter. Clean up any spills in your garage right away. This includes oil or grease spills. What can I do in the bathroom? Use night lights. Install grab bars by the toilet and in the tub and shower. Do not use towel bars as grab bars. Use non-skid mats or decals in the tub or shower. If you need to sit down in the shower, use a plastic, non-slip stool. Keep the floor dry. Clean up any water that spills on the floor as soon as it happens. Remove soap buildup in the tub or shower regularly. Attach bath mats securely with double-sided non-slip rug tape. Do not have throw  rugs and other things on the floor that can make you trip. What can I do in the bedroom? Use night lights. Make sure that you have a light by your bed that is easy to reach. Do not use any sheets or blankets that are too big for your bed. They should not hang down onto the floor. Have a firm chair that has side arms. You can use this for support while you get dressed. Do not have throw rugs and other things on the floor that can make you trip. What can I do in the kitchen? Clean up any spills right away. Avoid walking on wet floors. Keep items that you use a lot in easy-to-reach places. If you need to reach something above you, use a strong step stool that has a grab bar. Keep electrical cords out of the way. Do not use floor polish or wax that makes floors slippery. If you must use wax, use non-skid floor wax. Do not have throw rugs and other things on the floor that can make you trip. What can I do with my stairs? Do not leave any items on the stairs. Make sure that there are handrails on both sides of the stairs and use them. Fix handrails that are broken or loose. Make sure that handrails are as long as the stairways. Check any carpeting to make sure that it is firmly attached to the stairs. Fix any carpet that is loose or worn. Avoid having  throw rugs at the top or bottom of the stairs. If you do have throw rugs, attach them to the floor with carpet tape. Make sure that you have a light switch at the top of the stairs and the bottom of the stairs. If you do not have them, ask someone to add them for you. What else can I do to help prevent falls? Wear shoes that: Do not have high heels. Have rubber bottoms. Are comfortable and fit you well. Are closed at the toe. Do not wear sandals. If you use a stepladder: Make sure that it is fully opened. Do not climb a closed stepladder. Make sure that both sides of the stepladder are locked into place. Ask someone to hold it for you, if  possible. Clearly mark and make sure that you can see: Any grab bars or handrails. First and last steps. Where the edge of each step is. Use tools that help you move around (mobility aids) if they are needed. These include: Canes. Walkers. Scooters. Crutches. Turn on the lights when you go into a dark area. Replace any light bulbs as soon as they burn out. Set up your furniture so you have a clear path. Avoid moving your furniture around. If any of your floors are uneven, fix them. If there are any pets around you, be aware of where they are. Review your medicines with your doctor. Some medicines can make you feel dizzy. This can increase your chance of falling. Ask your doctor what other things that you can do to help prevent falls. This information is not intended to replace advice given to you by your health care provider. Make sure you discuss any questions you have with your health care provider. Document Released: 10/04/2009 Document Revised: 05/15/2016 Document Reviewed: 01/12/2015 Elsevier Interactive Patient Education  2017 [employer].

## 2021-10-28 NOTE — Progress Notes (Signed)
Patient ID: Leah Robinson, female   DOB: 1951/03/29, 70 y.o.   MRN: [id number]   This visit occurred during the SARS-CoV-2 public health emergency.  Safety protocols were in place, including screening questions prior to the visit, additional usage of staff PPE, and extensive cleaning of exam room while observing appropriate contact time as indicated for disinfecting solutions.   HPI: Leah White Gervin is a 70 y.o.-year-old female, initially referred by her [place] FNP (Dr. Glendale Chard - [facility]  Returning for follow-up for DM2, dx as prediabetes in 2016, then DM2, non-insulin-dependent, uncontrolled, without long term complications. Last visit 6.5 months ago.  Interim history: No increased urination, blurry vision, nausea, chest pain. She had multiple deaths in her family. Also, all her family had Covid, but she did not get it. He continues to exercise consistently and overnight, at 5:30 in the morning, 5-6 times a week.  Reviewed HbA1c levels: Lab Results  Component Value Date   HGBA1C 8.8 (H) 04/10/2021   HGBA1C 9.2 (A) 10/26/2020   HGBA1C 7.2 (A) 02/08/2020  09/05/2019: HbA1c calculated from fructosamine: 7.2% 09/22/2018: HbA1c calculated from fructosamine 6.5% 05/18/2018: HbA1c calculated from fructosamine is much better, at 6.5%. 06/30/2017: HbA1c 7.8% Reportedly, prev. in the 6-7% range.  She is not on any diabetic medications. In 10/2020, I sent Wilder Glade to her pharmacy but this was not affordable. We tried Metformin ER 1000 mg with dinner-started 08/2019 >> stopped 09/2019 2/2 AP/constipation. She had to stop Metformin (started 2016) because of diarrhea and abdominal pain.  Pt checks sugars once a day: - am:  140-160 >> 168-220 >> 148-201 >> 160-170  - 2h after b'fast: 128-140 >> 138, 142 >> n/c >> 140 >> n/c - before lunch: n/c >> 140, 170s, 200 >> 122-155 >> 140s - 2h after lunch: 121-128 >> 129-141 >> n/c >> 112-142 >> n/c - before dinner:  130 >>  99 >> 120-150 >> 121-132 >> 119-160 - 2h after dinner: 118-130, 150 >> 89, 104-140 >> 118 >> n/c - bedtime: n/c >> 134, 149 >> 120 >> n/c >> 126 >> n/c - nighttime: n/c Lowest: 120 >> 112 >> 119 Highest: 220 >> 201 >> 200  Glucometer: Accu-Chek guide -needs a replacement  Pt's meals are: - Breakfast: toast, bacon, cereal, sandwich, ham bisquit, boiled egg - Lunch: salad + nuts, [place] sandwich, tuna - Dinner: greens or veggies, chicken/pork/fish - at ~6:30 pm  - Snacks: 3 (mostly at night) >> popcorn, icecream, PB crackers  -+ Mild CKD, last BUN/creatinine:  Lab Results  Component Value Date   BUN 12 04/10/2021   BUN 11 10/10/2020   CREATININE 0.82 04/10/2021   CREATININE 0.79 10/10/2020  06/30/2017: CMP normal, but with a glucose of 168, BUN/Cr 14/0.86 (GFR 81)  12/10/2016: 10/0.80, eGFR 89, Glu 126  -+ HL. Latest lipids: Lab Results  Component Value Date   CHOL 197 04/10/2021   HDL 54 04/10/2021   LDLCALC 125 (H) 04/10/2021   TRIG 102 04/10/2021   CHOLHDL 3.6 04/10/2021  06/30/2017: 223/102/58/145  12/10/2016: 186/83/51/118 On Lipitor 10 mg daily.  - last eye exam was on 08/16/2021: No DR reportedly, low grade cataracts.  -+ Occasional numbness and tingling in her feet.  Before the coronavirus pandemic, she was going to the gym: swimming, walking, weights, Silver sneakers class-  5-6x a week.  [facility]  ROS: + See HPI Neurological: no tremors/+ numbness/+ tingling/no dizziness  I reviewed pt's medications, allergies, PMH, social  hx, family hx, and changes were documented in the history of present illness. Otherwise, unchanged from my initial visit note.  Past Medical History:  Diagnosis Date   Arthritis    Diabetes mellitus without complication ([place])    Past Surgical History:  Procedure Laterality Date   DILATATION & CURRETTAGE/HYSTEROSCOPY WITH RESECTOCOPE N/A 06/21/2015   Procedure: DILATATION & CURETTAGE/HYSTEROSCOPY WITH  RESECTOCOPE;  Surgeon: Servando Salina, MD;  Location: [facility];  Service: Gynecology;  Laterality: N/A;   ROBOTIC ASSISTED SALPINGO OOPHERECTOMY Left 06/21/2015   Procedure: ROBOTIC ASSISTED LEFT SALPINGO OOPHORECTOMY AND RIGHT SALPINGECTOMY With  Pelvic Washings;  Surgeon: Servando Salina, MD;  Location: [facility];  Service: Gynecology;  Laterality: Left;  2 1/2 hrs.   TUBAL LIGATION     Social History   Social History   Marital status: Married    Spouse name: N/A   Number of children: 1   Occupational History   Retired from [employer]   Social History Main Topics   Smoking status: Former Research scientist (life sciences), quit in [place] tobacco: Never Used   Alcohol use No   Drug use: No   Current Outpatient Medications on File Prior to Visit  Medication Sig Dispense Refill   Accu-Chek FastClix Lancets MISC Use to check sugar once daily 102 each 3   aspirin (ASPIRIN ADULT LOW STRENGTH) 81 MG chewable tablet Chew 1 tablet (81 mg total) by mouth daily. 30 tablet 1   atorvastatin (LIPITOR) 10 MG tablet Take 1 tablet by mouth daily 90 tablet 3   Blood Glucose Monitoring Suppl (ACCU-CHEK GUIDE) w/Device KIT 1 Device by Does not apply route daily. 1 kit 0   cholecalciferol (VITAMIN D) 1000 UNITS tablet Take 1,000 Units by mouth daily.     glucose blood (ACCU-CHEK GUIDE) test strip Use as instructed to check sugar once daily 100 each 3   Multiple Vitamin (MULTIVITAMIN WITH MINERALS) TABS tablet Take 1 tablet by mouth daily.     No current facility-administered medications on file prior to visit.   No Known Allergies   Pertinent FH: Pt has FH of DM in mother, maternal uncle.  PE: BP 120/82 (BP Location: Right Arm, Patient Position: Sitting, Cuff Size: Normal)   Pulse 63   Ht $R'5\' 5"'mv$  (1.651 m)   Wt 204 lb (92.5 kg)   SpO2 96%   BMI 33.95 kg/m  Wt Readings from Last 3 Encounters:  10/29/21 204 lb (92.5 kg)  10/23/21 210 lb (95.3 kg)  04/11/21 204 lb 6.4 oz (92.7 kg)   Constitutional: overweight,  in NAD Eyes: PERRLA, EOMI, no exophthalmos ENT: moist mucous membranes, no thyromegaly, no cervical lymphadenopathy Cardiovascular: RRR, No MRG Respiratory: CTA B Gastrointestinal: abdomen soft, NT, ND, BS+ Musculoskeletal: no deformities, strength intact in all 4 Skin: moist, warm, no rashes Neurological: no tremor with outstretched hands, DTR normal in all 4  ASSESSMENT: 1. DM2, non-insulin-dependent, without long term complications, but with hyperglycemia  2. HL  3.  Obesity class II  PLAN:  1. Patient with relatively well-controlled diabetes, previously on metformin IR, then ER, which she did not tolerate due to abdominal pain and diarrhea.  HbA1c started to increase afterwards, to 9.2%, especially as she also relaxed her diet: eating fried foods and snacking after dinner.  We discussed about cutting snacking out and I also advised her to write down in her log possible reasons for the unusual blood sugars. I did suggest an SGLT2 inhibitor but she was not able to obtain [place] due  to price.  She refused injectables so we did not try a GLP-1 receptor agonist.  Before last visit, sugars started to improve slightly but they were still high in the morning, up to low 200s.  Later in the day, almost all of them were at goal.  Upon questioning, she was continuing snacks after dinner.  I again advised her to stop.  We discussed that if this did not help, we could try a p.o. GLP-1 receptor agonist, if affordable, extended release sulfonylurea, Actos -if she still refused injectables. -At this visit, sugars are still high, above target, in the morning but better before dinner.  She is exercising consistently, but has dietary indiscretions in the form of sweets and snacks.  She is trying to cut this down.  However, for now, we did discuss that we do need to start her on medication, especially with the holidays coming up.  She would like to retry metformin ER.  She mentions that she tried this in the  morning before, but would be open to try it in the evening.  I advised her to start with 1 tablet with dinner and if she can tolerate it well, to try to increase it to 1000 mg with dinner.  I also advised her to check some sugars after dinner, says now she is not checking at all.  I sent a prescription for another meter to her pharmacy as hers is not defective. - I suggested to:  Patient Instructions  Stop fried food and reduce red meat and fatty foods. Stop snacking after dinner.  Start Metformin ER 500 mg with dinner. If you tolerate this well, try to add another tablet of 500 mg with dinner.  CHECK SOME SUGARS AFTER DINNER.  Write the sugars down in a log or bring your meter.  Please return in 3 months with your sugar log.  - we checked her HbA1c: 8.6% (slightly lower) - advised to check sugars at different times of the day - 1x a day, rotating check times - advised for yearly eye exams >> she is UTD - return to clinic in 3 months   2. HL -Reviewed latest lipid panel from 03/2021: LDL above target, the other fractions at goal Lab Results  Component Value Date   CHOL 197 04/10/2021   HDL 54 04/10/2021   LDLCALC 125 (H) 04/10/2021   TRIG 102 04/10/2021   CHOLHDL 3.6 04/10/2021  -She was on Lipitor 10 mg every other day without side effects.  Afetr last visit, she started to take it daily.  3.  Obesity class II -She continues to exercise at the gym -Lost 5 pounds before last visit -Weight is stable at this visit  [name], MD PhD [facility]

## 2021-10-29 ENCOUNTER — Other Ambulatory Visit: Payer: Self-pay

## 2021-10-29 ENCOUNTER — Encounter: Payer: Self-pay | Admitting: Internal Medicine

## 2021-10-29 ENCOUNTER — Ambulatory Visit: Payer: Medicare PPO | Admitting: Internal Medicine

## 2021-10-29 VITALS — BP 120/82 | HR 63 | Ht 65.0 in | Wt 204.0 lb

## 2021-10-29 DIAGNOSIS — E1165 Type 2 diabetes mellitus with hyperglycemia: Secondary | ICD-10-CM | POA: Diagnosis not present

## 2021-10-29 DIAGNOSIS — E669 Obesity, unspecified: Secondary | ICD-10-CM | POA: Diagnosis not present

## 2021-10-29 DIAGNOSIS — E782 Mixed hyperlipidemia: Secondary | ICD-10-CM

## 2021-10-29 LAB — POCT GLYCOSYLATED HEMOGLOBIN (HGB A1C): Hemoglobin A1C: 8.6 % — AB (ref 4.0–5.6)

## 2021-10-29 MED ORDER — ACCU-CHEK GUIDE W/DEVICE KIT: 1.0000 | PACK | Freq: Every day | 0 refills | Status: AC

## 2021-10-29 MED ORDER — METFORMIN HCL ER 500 MG PO TB24: 1000.0000 mg | ORAL_TABLET | Freq: Every day | ORAL | 3 refills | Status: DC

## 2021-10-29 NOTE — Patient Instructions (Addendum)
Stop fried food and reduce red meat and fatty foods. Stop snacking after dinner.  Start Metformin ER 500 mg with dinner. If you tolerate this well, try to add another tablet of 500 mg with dinner.  CHECK SOME SUGARS AFTER DINNER.  Write the sugars down in a log or bring your meter.  Please return in 3 months with your sugar log.

## 2021-11-22 DIAGNOSIS — H40013 Open angle with borderline findings, low risk, bilateral: Secondary | ICD-10-CM | POA: Diagnosis not present

## 2022-01-20 ENCOUNTER — Other Ambulatory Visit: Payer: Self-pay

## 2022-01-20 ENCOUNTER — Ambulatory Visit (INDEPENDENT_AMBULATORY_CARE_PROVIDER_SITE_OTHER): Payer: Medicare PPO | Admitting: Nurse Practitioner

## 2022-01-20 ENCOUNTER — Encounter: Payer: Self-pay | Admitting: Nurse Practitioner

## 2022-01-20 VITALS — BP 110/62 | HR 68 | Temp 98.1°F | Ht 65.0 in | Wt 204.8 lb

## 2022-01-20 DIAGNOSIS — Z6834 Body mass index (BMI) 34.0-34.9, adult: Secondary | ICD-10-CM | POA: Diagnosis not present

## 2022-01-20 DIAGNOSIS — E6609 Other obesity due to excess calories: Secondary | ICD-10-CM | POA: Diagnosis not present

## 2022-01-20 DIAGNOSIS — E1159 Type 2 diabetes mellitus with other circulatory complications: Secondary | ICD-10-CM

## 2022-01-20 DIAGNOSIS — Z79899 Other long term (current) drug therapy: Secondary | ICD-10-CM | POA: Diagnosis not present

## 2022-01-20 DIAGNOSIS — I739 Peripheral vascular disease, unspecified: Secondary | ICD-10-CM

## 2022-01-20 DIAGNOSIS — E1165 Type 2 diabetes mellitus with hyperglycemia: Secondary | ICD-10-CM

## 2022-01-20 DIAGNOSIS — E782 Mixed hyperlipidemia: Secondary | ICD-10-CM | POA: Diagnosis not present

## 2022-01-20 DIAGNOSIS — Z0000 Encounter for general adult medical examination without abnormal findings: Secondary | ICD-10-CM

## 2022-01-20 LAB — POCT URINALYSIS DIPSTICK
Bilirubin, UA: NEGATIVE
Blood, UA: NEGATIVE
Glucose, UA: NEGATIVE
Ketones, UA: NEGATIVE
Leukocytes, UA: NEGATIVE
Nitrite, UA: NEGATIVE
Protein, UA: NEGATIVE
Spec Grav, UA: 1.025 (ref 1.010–1.025)
Urobilinogen, UA: 0.2 E.U./dL
pH, UA: 5 (ref 5.0–8.0)

## 2022-01-20 NOTE — Patient Instructions (Signed)

## 2022-01-20 NOTE — Progress Notes (Signed)
I,Tianna Badgett,acting as a Education administrator for [employer], FNP.,have documented all relevant documentation on the behalf of Minette Brine, FNP,as directed by  Minette Brine, FNP while in the presence of Minette Brine, [place].  This visit occurred during the SARS-CoV-2 public health emergency.  Safety protocols were in place, including screening questions prior to the visit, additional usage of staff PPE, and extensive cleaning of exam room while observing appropriate contact time as indicated for disinfecting solutions.  Subjective:     Patient ID: Leah Robinson , female    DOB: [birth year] , 71 y.o.   MRN: [id number]   Chief Complaint  Patient presents with   Annual Exam    HPI  Patient presents for physical exam. She is trying the metformin again and is taking mid day and the evening    Past Medical History:  Diagnosis Date   Arthritis    Diabetes mellitus without complication ([place])      Family History  Problem Relation Age of Onset   Breast cancer Neg Hx      Current Outpatient Medications:    Accu-Chek FastClix Lancets MISC, Use to check sugar once daily, Disp: 102 each, Rfl: 3   aspirin (ASPIRIN ADULT LOW STRENGTH) 81 MG chewable tablet, Chew 1 tablet (81 mg total) by mouth daily., Disp: 30 tablet, Rfl: 1   atorvastatin (LIPITOR) 10 MG tablet, Take 1 tablet by mouth daily, Disp: 90 tablet, Rfl: 3   Blood Glucose Monitoring Suppl (ACCU-CHEK GUIDE) w/Device KIT, 1 Device by Does not apply route daily., Disp: 1 kit, Rfl: 0   cholecalciferol (VITAMIN D) 1000 UNITS tablet, Take 1,000 Units by mouth daily., Disp: , Rfl:    glucose blood (ACCU-CHEK GUIDE) test strip, Use as instructed to check sugar once daily, Disp: 100 each, Rfl: 3   metFORMIN (GLUCOPHAGE-XR) 500 MG 24 hr tablet, Take 2 tablets (1,000 mg total) by mouth daily with supper., Disp: 180 tablet, Rfl: 3   Multiple Vitamin (MULTIVITAMIN WITH MINERALS) TABS tablet, Take 1 tablet by mouth daily., Disp: , Rfl:    No Known  Allergies    The patient states she is post menopausal status.  No LMP recorded. Patient is postmenopausal.. Negative for Dysmenorrhea and Negative for Menorrhagia. Negative for: breast discharge, breast lump(s), breast pain and breast self exam. Associated symptoms include abnormal vaginal bleeding. Pertinent negatives include abnormal bleeding (hematology), anxiety, decreased libido, depression, difficulty falling sleep, dyspareunia, history of infertility, nocturia, sexual dysfunction, sleep disturbances, urinary incontinence, urinary urgency, vaginal discharge and vaginal itching. Diet regular; does feel it can improve.  The patient states her exercise level is moderate - water aerobics on Saturday. Body pump and walks the treadmill - 6 days a week.    The patient's tobacco use is:  Social History   Tobacco Use  Smoking Status Former  Smokeless Tobacco Never   She has been exposed to passive smoke. The patient's alcohol use is:  Social History   Substance and Sexual Activity  Alcohol Use No    Review of Systems  Constitutional: Negative.   HENT: Negative.    Eyes: Negative.   Respiratory: Negative.    Cardiovascular: Negative.   Gastrointestinal: Negative.   Endocrine: Negative.   Genitourinary: Negative.   Musculoskeletal: Negative.   Skin: Negative.   Allergic/Immunologic: Negative.   Neurological: Negative.   Hematological: Negative.   Psychiatric/Behavioral: Negative.      Today's Vitals   01/20/22 1018  BP: 110/62  Pulse: 68  Temp: 98.1 F (36.7  C)  TempSrc: Oral  Weight: 204 lb 12.8 oz (92.9 kg)  Height: $Remove'5\' 5"'uwXbOsH$  (1.651 m)   Body mass index is 34.08 kg/m.  Wt Readings from Last 3 Encounters:  01/20/22 204 lb 12.8 oz (92.9 kg)  10/29/21 204 lb (92.5 kg)  10/23/21 210 lb (95.3 kg)    Objective:  Physical Exam Vitals reviewed.  Constitutional:      General: She is not in acute distress.    Appearance: Normal appearance. She is well-developed. She is  obese.  HENT:     Head: Normocephalic and atraumatic.     Right Ear: Hearing, tympanic membrane, ear canal and external ear normal. There is no impacted cerumen.     Left Ear: Hearing, tympanic membrane, ear canal and external ear normal. There is no impacted cerumen.     Nose:     Comments: Deferred - masked    Mouth/Throat:     Comments: Deferred - masked Eyes:     General: Lids are normal.     Extraocular Movements: Extraocular movements intact.     Conjunctiva/sclera: Conjunctivae normal.     Pupils: Pupils are equal, round, and reactive to light.     Funduscopic exam:    Right eye: No papilledema.        Left eye: No papilledema.  Neck:     Thyroid: No thyroid mass.     Vascular: No carotid bruit.  Cardiovascular:     Rate and Rhythm: Normal rate and regular rhythm.     Pulses: Normal pulses.     Heart sounds: Normal heart sounds. No murmur heard. Pulmonary:     Effort: Pulmonary effort is normal. No respiratory distress.     Breath sounds: Normal breath sounds. No wheezing.  Chest:     Chest wall: No mass.  Breasts:    Tanner Score is 5.     Right: Normal. No mass or tenderness.     Left: Normal. No mass or tenderness.  Abdominal:     General: Abdomen is flat. Bowel sounds are normal. There is no distension.     Palpations: Abdomen is soft.     Tenderness: There is no abdominal tenderness.  Genitourinary:    Rectum: Guaiac result negative.  Musculoskeletal:        General: No swelling or tenderness. Normal range of motion.     Cervical back: Full passive range of motion without pain, normal range of motion and neck supple.     Right lower leg: No edema.     Left lower leg: No edema.  Lymphadenopathy:     Upper Body:     Right upper body: No supraclavicular, axillary or pectoral adenopathy.     Left upper body: No supraclavicular, axillary or pectoral adenopathy.  Skin:    General: Skin is warm and dry.     Capillary Refill: Capillary refill takes less than 2  seconds.  Neurological:     General: No focal deficit present.     Mental Status: She is alert and oriented to person, place, and time.     Cranial Nerves: No cranial nerve deficit.     Sensory: No sensory deficit.  Psychiatric:        Mood and Affect: Mood normal.        Behavior: Behavior normal.        Thought Content: Thought content normal.        Judgment: Judgment normal.        Assessment And  Plan:     1. Encounter for general adult medical examination w/o abnormal findings Behavior modifications discussed and diet history reviewed.   Pt will continue to exercise regularly and modify diet with low GI, plant based foods and decrease intake of processed foods.  Recommend intake of daily multivitamin, Vitamin D, and calcium.  Recommend mammogram and colonoscopy for preventive screenings, as well as recommend immunizations that include influenza, TDAP, and Shingles (up to date)  2. Class 1 obesity due to excess calories with serious comorbidity and body mass index (BMI) of 34.0 to 34.9 in adult Chronic Discussed healthy diet and regular exercise options  Encouraged to continue with regular physical activity of at least 150 minutes per week.  3. Type 2 diabetes mellitus with hyperglycemia, without long-term current use of insulin (HCC) Comments: Chronic, fair control. Continue follow up with Dr. Renne Crigler, she would like her HgbA1c done with Dr Renne Crigler. Encouraged to limit intake of sugary foods and drinks.   Diabetic foot exam done, no abnormal findings.  She is to complete a medical release for [facility] care for her last diabetic eye exam - POCT Urinalysis Dipstick (81002) - Microalbumin / Creatinine Urine Ratio - EKG 12-Lead - CMP14+EGFR - TSH  4. Mixed hyperlipidemia Chronic, tolerating statin well.  - Lipid panel  5. PAD (peripheral artery disease) ([place])  6. Other long term (current) drug therapy - CBC - TSH   Patient was given opportunity to ask questions. Patient  verbalized understanding of the plan and was able to repeat key elements of the plan. All questions were answered to their satisfaction.   Minette Brine, FNP   I, Minette Brine, FNP, have reviewed all documentation for this visit. The documentation on 01/20/22 for the exam, diagnosis, procedures, and orders are all accurate and complete.   THE PATIENT IS ENCOURAGED TO PRACTICE SOCIAL DISTANCING DUE TO THE COVID-19 PANDEMIC.

## 2022-01-21 ENCOUNTER — Encounter: Payer: Self-pay | Admitting: Nurse Practitioner

## 2022-01-21 LAB — CMP14+EGFR
ALT: 20 IU/L (ref 0–32)
AST: 18 IU/L (ref 0–40)
Albumin/Globulin Ratio: 1.4 (ref 1.2–2.2)
Albumin: 4.1 g/dL (ref 3.8–4.8)
Alkaline Phosphatase: 77 IU/L (ref 44–121)
BUN/Creatinine Ratio: 16 (ref 12–28)
BUN: 14 mg/dL (ref 8–27)
Bilirubin Total: 0.4 mg/dL (ref 0.0–1.2)
CO2: 25 mmol/L (ref 20–29)
Calcium: 9.3 mg/dL (ref 8.7–10.3)
Chloride: 106 mmol/L (ref 96–106)
Creatinine, Ser: 0.89 mg/dL (ref 0.57–1.00)
Globulin, Total: 2.9 g/dL (ref 1.5–4.5)
Glucose: 168 mg/dL — ABNORMAL HIGH (ref 70–99)
Potassium: 4.8 mmol/L (ref 3.5–5.2)
Sodium: 141 mmol/L (ref 134–144)
Total Protein: 7 g/dL (ref 6.0–8.5)
eGFR: 70 mL/min/{1.73_m2} (ref >59)

## 2022-01-21 LAB — CBC
Hematocrit: 37.1 % (ref 34.0–46.6)
Hemoglobin: 12.4 g/dL (ref 11.1–15.9)
MCH: 29.2 pg (ref 26.6–33.0)
MCHC: 33.4 g/dL (ref 31.5–35.7)
MCV: 87 fL (ref 79–97)
Platelets: 201 10*3/uL (ref 150–450)
RBC: 4.25 x10E6/uL (ref 3.77–5.28)
RDW: 11.9 % (ref 11.7–15.4)
WBC: 5.7 10*3/uL (ref 3.4–10.8)

## 2022-01-21 LAB — LIPID PANEL
Chol/HDL Ratio: 3.1 ratio (ref 0.0–4.4)
Cholesterol, Total: 169 mg/dL (ref 100–199)
HDL: 55 mg/dL (ref >39)
LDL Chol Calc (NIH): 102 mg/dL — ABNORMAL HIGH (ref 0–99)
Triglycerides: 63 mg/dL (ref 0–149)
VLDL Cholesterol Cal: 12 mg/dL (ref 5–40)

## 2022-01-21 LAB — TSH: TSH: 2.54 u[IU]/mL (ref 0.450–4.500)

## 2022-01-21 LAB — MICROALBUMIN / CREATININE URINE RATIO
Creatinine, Urine: 166.1 mg/dL
Microalb/Creat Ratio: 9 mg/g creat (ref 0–29)
Microalbumin, Urine: 15.2 ug/mL

## 2022-01-24 ENCOUNTER — Other Ambulatory Visit: Payer: Self-pay | Admitting: Internal Medicine

## 2022-01-31 ENCOUNTER — Ambulatory Visit: Payer: Medicare PPO | Admitting: Internal Medicine

## 2022-01-31 ENCOUNTER — Other Ambulatory Visit: Payer: Self-pay

## 2022-01-31 ENCOUNTER — Encounter: Payer: Self-pay | Admitting: Internal Medicine

## 2022-01-31 VITALS — BP 110/68 | HR 58 | Ht 65.0 in | Wt 205.2 lb

## 2022-01-31 DIAGNOSIS — E669 Obesity, unspecified: Secondary | ICD-10-CM

## 2022-01-31 DIAGNOSIS — E782 Mixed hyperlipidemia: Secondary | ICD-10-CM

## 2022-01-31 DIAGNOSIS — E1165 Type 2 diabetes mellitus with hyperglycemia: Secondary | ICD-10-CM

## 2022-01-31 LAB — POCT GLYCOSYLATED HEMOGLOBIN (HGB A1C): Hemoglobin A1C: 8.1 % — AB (ref 4.0–5.6)

## 2022-01-31 NOTE — Patient Instructions (Addendum)
°  Continue: - Metformin ER 500 mg 2x a day   Try light exercise in the evening.  Please return in 3 months with your sugar log.

## 2022-01-31 NOTE — Progress Notes (Signed)
Patient ID: Leah Robinson, female   DOB: 11/02/1951, 71 y.o.   MRN: [id number]   This visit occurred during the SARS-CoV-2 public health emergency.  Safety protocols were in place, including screening questions prior to the visit, additional usage of staff PPE, and extensive cleaning of exam room while observing appropriate contact time as indicated for disinfecting solutions.   HPI: Leah Robinson is a 71 y.o.-year-old female, initially referred by her [place] FNP (Dr. Glendale Chard - [facility]),  Returning for follow-up for DM2, dx as prediabetes in 2016, then DM2, non-insulin-dependent, uncontrolled, without long term complications. Last visit 3 months ago.  Interim history: No increased urination, blurry vision, nausea, chest pain. He continues to exercise consistently at 5:30 in the morning at the [facility], 5-6 times a week.  Reviewed HbA1c levels: Lab Results  Component Value Date   HGBA1C 8.6 (A) 10/29/2021   HGBA1C 8.8 (H) 04/10/2021   HGBA1C 9.2 (A) 10/26/2020  09/05/2019: HbA1c calculated from fructosamine: 7.2% 09/22/2018: HbA1c calculated from fructosamine 6.5% 05/18/2018: HbA1c calculated from fructosamine is much better, at 6.5%. 06/30/2017: HbA1c 7.8% Reportedly, prev. in the 6-7% range.   She is on: - Metform ER 500 >> 1000 mg with dinner-started 10/2021 >> 500 mg 2x with L and D In 10/2020, Wilder Glade was not affordable. We previously tried Metformin ER 1000 mg with dinner-started 08/2019 >> stopped 09/2019 2/2 AP/constipation. She previously tried metformin IR  -started 2016 >> stopped 2/2 diarrhea and abdominal pain.  Pt checks sugars once a day: - am:  168-220 >> 148-201 >> 160-170 >> 115 (last week), 150, 169, 182 - 2h after b'fast: 138, 142 >> n/c >> 140 >> n/c >> 129, 132 - before lunch: n/c >> 140, 170s, 200 >> 122-155 >> 140s >> 99 - 2h after lunch: 129-141 >> n/c >> 112-142 >> n/c >> 116-136 - before dinner:  99 >> 120-150 >> 121-132 >>  119-160 >> 94-113 - 2h after dinner: 89, 104-140 >> 118 >> n/c >> 108-136 - bedtime: n/c >> 134, 149 >> 120 >> n/c >> 126 >> n/c >> 133 - nighttime: n/c Lowest: 120 >> 112 >> 119 >> 94 Highest: 220 >> 201 >> 200 >> 182.  Glucometer: Accu-Chek guide -needs a replacement  Pt's meals are: - Breakfast: toast, bacon, cereal, sandwich, ham bisquit, boiled egg - Lunch: salad + nuts, [place] sandwich, tuna - Dinner: greens or veggies, chicken/pork/fish - at ~6:30 pm  - Snacks: 3 (mostly at night) >> popcorn, icecream, PB crackers  -+ Mild CKD, last BUN/creatinine:  Lab Results  Component Value Date   BUN 14 01/20/2022   BUN 12 04/10/2021   CREATININE 0.89 01/20/2022   CREATININE 0.82 04/10/2021  06/30/2017: CMP normal, but with a glucose of 168, BUN/Cr 14/0.86 (GFR 81)  12/10/2016: 10/0.80, eGFR 89, Glu 126  -+ HL. Latest lipids: Lab Results  Component Value Date   CHOL 169 01/20/2022   HDL 55 01/20/2022   LDLCALC 102 (H) 01/20/2022   TRIG 63 01/20/2022   CHOLHDL 3.1 01/20/2022  06/30/2017: 223/102/58/145  12/10/2016: 186/83/51/118 On Lipitor 10 mg daily.  - last eye exam was on 08/16/2021: No DR, low grade cataracts.  -+ Occasional numbness and tingling in her feet.  Before the coronavirus pandemic, she was going to the gym: swimming, walking, weights, Silver sneakers class-  5-6x a week.  [facility]  ROS: + See HPI Neurological: no tremors/+ numbness/+ tingling/no dizziness  I reviewed pt's  medications, allergies, PMH, social hx, family hx, and changes were documented in the history of present illness. Otherwise, unchanged from my initial visit note.  Past Medical History:  Diagnosis Date   Arthritis    Diabetes mellitus without complication ([place])    Past Surgical History:  Procedure Laterality Date   DILATATION & CURRETTAGE/HYSTEROSCOPY WITH RESECTOCOPE N/A 06/21/2015   Procedure: DILATATION & CURETTAGE/HYSTEROSCOPY WITH RESECTOCOPE;   Surgeon: Servando Salina, MD;  Location: [facility];  Service: Gynecology;  Laterality: N/A;   ROBOTIC ASSISTED SALPINGO OOPHERECTOMY Left 06/21/2015   Procedure: ROBOTIC ASSISTED LEFT SALPINGO OOPHORECTOMY AND RIGHT SALPINGECTOMY With  Pelvic Washings;  Surgeon: Servando Salina, MD;  Location: [facility];  Service: Gynecology;  Laterality: Left;  2 1/2 hrs.   TUBAL LIGATION     Social History   Social History   Marital status: Married    Spouse name: N/A   Number of children: 1   Occupational History   Retired from [employer]   Social History Main Topics   Smoking status: Former Research scientist (life sciences), quit in [place] tobacco: Never Used   Alcohol use No   Drug use: No   Current Outpatient Medications on File Prior to Visit  Medication Sig Dispense Refill   Accu-Chek FastClix Lancets MISC USE TO CHECK BLOOD SUGAR ONCE DAILY 102 each 3   aspirin (ASPIRIN ADULT LOW STRENGTH) 81 MG chewable tablet Chew 1 tablet (81 mg total) by mouth daily. 30 tablet 1   atorvastatin (LIPITOR) 10 MG tablet Take 1 tablet by mouth daily 90 tablet 3   Blood Glucose Monitoring Suppl (ACCU-CHEK GUIDE) w/Device KIT 1 Device by Does not apply route daily. 1 kit 0   cholecalciferol (VITAMIN D) 1000 UNITS tablet Take 1,000 Units by mouth daily.     glucose blood (ACCU-CHEK GUIDE) test strip USE AS DIRECTED BLOOD SUGAR ONCE DAILY 100 strip 1   metFORMIN (GLUCOPHAGE-XR) 500 MG 24 hr tablet Take 2 tablets (1,000 mg total) by mouth daily with supper. 180 tablet 3   Multiple Vitamin (MULTIVITAMIN WITH MINERALS) TABS tablet Take 1 tablet by mouth daily.     No current facility-administered medications on file prior to visit.   No Known Allergies   Pertinent FH: Pt has FH of DM in mother, maternal uncle.  PE: BP 110/68 (BP Location: Right Arm, Patient Position: Sitting, Cuff Size: Normal)    Pulse (!) 58    Ht $R'5\' 5"'CA$  (1.651 m)    Wt 205 lb 3.2 oz (93.1 kg)    SpO2 97%    BMI 34.15 kg/m  Wt Readings from Last 3 Encounters:   01/31/22 205 lb 3.2 oz (93.1 kg)  01/20/22 204 lb 12.8 oz (92.9 kg)  10/29/21 204 lb (92.5 kg)   Constitutional: overweight, in NAD Eyes: PERRLA, EOMI, no exophthalmos ENT: moist mucous membranes, no thyromegaly, no cervical lymphadenopathy Cardiovascular: RRR, No MRG Respiratory: CTA B Musculoskeletal: no deformities, strength intact in all 4 Skin: moist, warm, no rashes Neurological: no tremor with outstretched hands, DTR normal in all 4  ASSESSMENT: 1. DM2, non-insulin-dependent, without long term complications, but with hyperglycemia  2. HL  3.  Obesity class II  PLAN:  1. Patient with previously fairly well-controlled diabetes on metformin (IR, ER), which she did not tolerate due to abdominal pain and diarrhea.  After she came off metformin, HbA1c increased to 9.2%, as she also relaxed her diet: Eating fried foods, snacking after dinner.  At last visit we discussed about stopping snacking after  dinner and reduce fatty and fried foods.  We tried to start an SGLT2 inhibitor but Wilder Glade was too expensive.  She refused injectables.  At last visit HbA1c was slightly better, at 8.6%.  Sugars were still high, above target, in the morning, but improved before dinner.  She was exercising consistently but still had dietary indiscretions, which we discussed about reducing.  She was not checking blood sugars at all and I sent another meter to her pharmacy.  We also discussed about possibly trying metformin again in the ER form, per her request.  She could not tolerate 1000 mg taken at the same time, but she split it into 2 doses, 1 with lunch and 1 with dinner, with good tolerance.  We will continue this. -At today's visit, sugars are at goal later in the day, but they are occasionally higher than target in the morning.  Even the morning sugars have improved lately, she only has 1 value checked in the last 2 weeks and this is 115.  As of now, there is no need to change her regimen.  To improve her  morning sugars, I did suggest to also do some light exercise in the evening, for example a 20 min walk. - I suggested to:  Patient Instructions  Continue: - Metformin ER 500 mg 2x a day   Try light exercise in the evening.  Please return in 3 months with your sugar log.  - we checked her HbA1c: 8.1% (improved, but higher than expected from her log).  At next visit, if the discrepancy persists, we will check a fructosamine level. - advised to check sugars at different times of the day - 1x a day, rotating check times - advised for yearly eye exams >> she is UTD - UTD with foot exam - return to clinic in 3 months   2. HL -Reviewed latest lipid panel from 12/2021: LDL above target, the other fractions at goal: Lab Results  Component Value Date   CHOL 169 01/20/2022   HDL 55 01/20/2022   LDLCALC 102 (H) 01/20/2022   TRIG 63 01/20/2022   CHOLHDL 3.1 01/20/2022  -On Lipitor 10 mg daily without side effects  3.  Obesity class II -She continues to exercise at the gym -Weight was stable at last visit and remains stable now  [name], MD PhD [facility]

## 2022-02-26 DIAGNOSIS — H40013 Open angle with borderline findings, low risk, bilateral: Secondary | ICD-10-CM | POA: Diagnosis not present

## 2022-05-02 ENCOUNTER — Ambulatory Visit: Payer: Medicare PPO | Admitting: Internal Medicine

## 2022-07-02 DIAGNOSIS — H40013 Open angle with borderline findings, low risk, bilateral: Secondary | ICD-10-CM | POA: Diagnosis not present

## 2022-07-04 ENCOUNTER — Other Ambulatory Visit: Payer: Self-pay | Admitting: Internal Medicine

## 2022-07-04 DIAGNOSIS — Z1231 Encounter for screening mammogram for malignant neoplasm of breast: Secondary | ICD-10-CM

## 2022-07-21 ENCOUNTER — Ambulatory Visit: Payer: Medicare PPO | Admitting: Nurse Practitioner

## 2022-07-22 ENCOUNTER — Telehealth: Payer: Self-pay | Admitting: *Deleted

## 2022-07-22 ENCOUNTER — Encounter: Payer: Self-pay | Admitting: Nurse Practitioner

## 2022-07-22 ENCOUNTER — Ambulatory Visit (INDEPENDENT_AMBULATORY_CARE_PROVIDER_SITE_OTHER): Payer: Medicare PPO | Admitting: Nurse Practitioner

## 2022-07-22 VITALS — BP 118/78 | HR 72 | Temp 97.7°F | Ht 65.0 in | Wt 203.0 lb

## 2022-07-22 DIAGNOSIS — Z6833 Body mass index (BMI) 33.0-33.9, adult: Secondary | ICD-10-CM

## 2022-07-22 DIAGNOSIS — E1151 Type 2 diabetes mellitus with diabetic peripheral angiopathy without gangrene: Secondary | ICD-10-CM

## 2022-07-22 DIAGNOSIS — I739 Peripheral vascular disease, unspecified: Secondary | ICD-10-CM | POA: Diagnosis not present

## 2022-07-22 DIAGNOSIS — E782 Mixed hyperlipidemia: Secondary | ICD-10-CM

## 2022-07-22 NOTE — Progress Notes (Signed)
I,Victoria T Hamilton,acting as a Neurosurgeon for Arnette Felts, FNP.,have documented all relevant documentation on the behalf of Arnette Felts, FNP,as directed by  Arnette Felts, FNP while in the presence of Arnette Felts, FNP.    Subjective:     Patient ID: Leah Robinson , female    DOB: [birth year] , 71 y.o.   MRN: [id number]   Chief Complaint  Patient presents with   Hypertension   Diabetes    HPI  Pt is here today for f/u on diabetes, She continues to go to see her Endocrinologist Dr Wyonia Hough. She reports having an upcoming appointment on August 17th.   Patient reports exercising & swimming  at [employer].  She admits still eating carbs, she cannot stay away from bread. Snacks at night range between popcorn, peanuts or ice cream sandwich.   Wt Readings from Last 3 Encounters: 07/22/22 : 203 lb (92.1 kg) 01/31/22 : 205 lb 3.2 oz (93.1 kg) 01/20/22 : 204 lb 12.8 oz (92.9 kg)     Hypertension This is a chronic problem. The current episode started more than 1 year ago. The problem has been gradually improving since onset. The problem is uncontrolled. Pertinent negatives include no blurred vision, chest pain, headaches or shortness of breath. Risk factors for coronary artery disease include obesity and sedentary lifestyle. Past treatments include nothing.  Diabetes She presents for her follow-up ([place]) diabetic visit. She has type 2 diabetes mellitus. Pertinent negatives for hypoglycemia include no dizziness or headaches. Pertinent negatives for diabetes include no blurred vision, no chest pain, no fatigue, no polydipsia, no polyphagia and no polyuria. There are no hypoglycemic complications. Symptoms are stable. There are no diabetic complications. There are no known risk factors for coronary artery disease. Current diabetic treatment includes diet. She is compliant with treatment all of the time. Her weight is decreasing steadily. She is following a generally unhealthy diet. When asked  about meal planning, she reported none. She has not had a previous visit with a dietitian. She rarely participates in exercise. (Blood sugars 150-160 in am but after lunch 120 and at night before dinner 87. ) An ACE inhibitor/angiotensin II receptor blocker is not being taken. She does not see a podiatrist.Eye exam is current.     Past Medical History:  Diagnosis Date   Arthritis    Diabetes mellitus without complication (HCC)      Family History  Problem Relation Age of Onset   Breast cancer Neg Hx      Current Outpatient Medications:    Accu-Chek FastClix Lancets MISC, USE TO CHECK BLOOD SUGAR ONCE DAILY, Disp: 102 each, Rfl: 3   aspirin (ASPIRIN ADULT LOW STRENGTH) 81 MG chewable tablet, Chew 1 tablet (81 mg total) by mouth daily., Disp: 30 tablet, Rfl: 1   atorvastatin (LIPITOR) 10 MG tablet, Take 1 tablet by mouth daily, Disp: 90 tablet, Rfl: 3   Blood Glucose Monitoring Suppl (ACCU-CHEK GUIDE) w/Device KIT, 1 Device by Does not apply route daily., Disp: 1 kit, Rfl: 0   cholecalciferol (VITAMIN D) 1000 UNITS tablet, Take 1,000 Units by mouth daily., Disp: , Rfl:    glucose blood (ACCU-CHEK GUIDE) test strip, USE AS DIRECTED BLOOD SUGAR ONCE DAILY, Disp: 100 strip, Rfl: 1   metFORMIN (GLUCOPHAGE-XR) 500 MG 24 hr tablet, Take 2 tablets (1,000 mg total) by mouth daily with supper., Disp: 180 tablet, Rfl: 3   Multiple Vitamin (MULTIVITAMIN WITH MINERALS) TABS tablet, Take 1 tablet by mouth daily., Disp: , Rfl:  No Known Allergies   Review of Systems  Constitutional: Negative.  Negative for fatigue.  Eyes:  Negative for blurred vision.  Respiratory: Negative.  Negative for shortness of breath.   Cardiovascular: Negative.  Negative for chest pain.  Endocrine: Negative for polydipsia, polyphagia and polyuria.  Neurological: Negative.  Negative for dizziness and headaches.  Psychiatric/Behavioral: Negative.       Today's Vitals   07/22/22 0830  BP: 118/78  Pulse: 72  Temp:  97.7 F (36.5 C)  SpO2: 98%  Weight: 203 lb (92.1 kg)  Height: $Remove'5\' 5"'ITQdLcb$  (1.651 m)  PainSc: 0-No pain   Body mass index is 33.78 kg/m.  Wt Readings from Last 3 Encounters:  07/22/22 203 lb (92.1 kg)  01/31/22 205 lb 3.2 oz (93.1 kg)  01/20/22 204 lb 12.8 oz (92.9 kg)    Objective:  Physical Exam Vitals reviewed.  Constitutional:      General: She is not in acute distress.    Appearance: Normal appearance.  Cardiovascular:     Rate and Rhythm: Normal rate.     Pulses: Normal pulses.     Heart sounds: Normal heart sounds. No murmur heard. Pulmonary:     Effort: Pulmonary effort is normal. No respiratory distress.     Breath sounds: Normal breath sounds. No wheezing.  Musculoskeletal:        General: No swelling or tenderness. Normal range of motion.  Skin:    General: Skin is warm and dry.     Capillary Refill: Capillary refill takes less than 2 seconds.  Neurological:     General: No focal deficit present.     Mental Status: She is alert and oriented to person, place, and time.     Cranial Nerves: No cranial nerve deficit.  Psychiatric:        Mood and Affect: Mood normal.        Behavior: Behavior normal.        Thought Content: Thought content normal.        Judgment: Judgment normal.         Assessment And Plan:     1. Type 2 diabetes mellitus with diabetic peripheral angiopathy without gangrene, without long-term current use of insulin (HCC) Comments: HgbA1c improved at last visit with Dr. Renne Crigler to 8.1. Per Dr Chrissie Noa most recent note if her A1c does not correlate will obtain a Fructosamine level. - CMP14+EGFR - AMB Referral to [place]  2. Mixed hyperlipidemia Comments: Continue statin, tolerating well.  - Lipid panel  3. PAD (peripheral artery disease) (HCC) Comments: Continue statin.  4. Body mass index (BMI) of 33.0 to 33.9 in adult She is encouraged to strive for BMI less than 30 to decrease cardiac risk. Advised to aim for at least  150 minutes of exercise per week.  Encouraged to eat a healthy diet low in carbohydrates.    Patient was given opportunity to ask questions. Patient verbalized understanding of the plan and was able to repeat key elements of the plan. All questions were answered to their satisfaction.  Minette Brine, FNP   I, Minette Brine, FNP, have reviewed all documentation for this visit. The documentation on 07/22/22 for the exam, diagnosis, procedures, and orders are all accurate and complete.   IF YOU HAVE BEEN REFERRED TO A SPECIALIST, IT MAY TAKE 1-2 WEEKS TO SCHEDULE/PROCESS THE REFERRAL. IF YOU HAVE NOT HEARD FROM US/SPECIALIST IN TWO WEEKS, PLEASE GIVE [place] A CALL AT [phone].   THE PATIENT IS  ENCOURAGED TO PRACTICE SOCIAL DISTANCING DUE TO THE COVID-19 PANDEMIC.   

## 2022-07-22 NOTE — Chronic Care Management (AMB) (Signed)
  Chronic Care Management   Outreach Note  07/22/2022 Name: Leah Robinson MRN: [id number] DOB: 1951/11/16  Leah Robinson is a 71 y.o. year old female who is a primary care patient of Minette Brine, [place]. I reached out to Leah Robinson by phone today in response to a referral sent by Ms. Leah Robinson's primary care provider.  An unsuccessful telephone outreach was attempted today. The patient was referred to the case management team for assistance with care management and care coordination.   Follow Up Plan: A HIPAA compliant phone message was left for the patient providing contact information and requesting a return call.  The care management team will reach out to the patient again over the next 7 days.  If patient returns call to provider office, please advise to call [place] * at [phone].*  [place]  Direct Dial: [phone]

## 2022-07-22 NOTE — Patient Instructions (Signed)

## 2022-07-23 LAB — CMP14+EGFR
ALT: 10 IU/L (ref 0–32)
AST: 13 IU/L (ref 0–40)
Albumin/Globulin Ratio: 1.4 (ref 1.2–2.2)
Albumin: 4.2 g/dL (ref 3.8–4.8)
Alkaline Phosphatase: 75 IU/L (ref 44–121)
BUN/Creatinine Ratio: 20 (ref 12–28)
BUN: 18 mg/dL (ref 8–27)
Bilirubin Total: 0.4 mg/dL (ref 0.0–1.2)
CO2: 24 mmol/L (ref 20–29)
Calcium: 9.6 mg/dL (ref 8.7–10.3)
Chloride: 103 mmol/L (ref 96–106)
Creatinine, Ser: 0.92 mg/dL (ref 0.57–1.00)
Globulin, Total: 3 g/dL (ref 1.5–4.5)
Glucose: 183 mg/dL — ABNORMAL HIGH (ref 70–99)
Potassium: 4.7 mmol/L (ref 3.5–5.2)
Sodium: 145 mmol/L — ABNORMAL HIGH (ref 134–144)
Total Protein: 7.2 g/dL (ref 6.0–8.5)
eGFR: 67 mL/min/{1.73_m2} (ref >59)

## 2022-07-23 LAB — LIPID PANEL
Chol/HDL Ratio: 2.7 ratio (ref 0.0–4.4)
Cholesterol, Total: 145 mg/dL (ref 100–199)
HDL: 53 mg/dL (ref >39)
LDL Chol Calc (NIH): 81 mg/dL (ref 0–99)
Triglycerides: 50 mg/dL (ref 0–149)
VLDL Cholesterol Cal: 11 mg/dL (ref 5–40)

## 2022-08-04 NOTE — Chronic Care Management (AMB) (Unsigned)
  Chronic Care Management   Outreach Note  08/04/2022 Name: Leah Robinson MRN: [id number] DOB: 11-Apr-1951  Leah Robinson is a 71 y.o. year old female who is a primary care patient of Minette Brine, [place]. I reached out to Leah Robinson by phone today in response to a referral sent by Ms. Leah Robinson's primary care provider.  A second unsuccessful telephone outreach was attempted today. The patient was referred to the case management team for assistance with care management and care coordination.   Follow Up Plan: A HIPAA compliant phone message was left for the patient providing contact information and requesting a return call.  The care management team will reach out to the patient again over the next 7 days.  If patient returns call to provider office, please advise to call [place]* at [phone].*  [place]  Direct Dial: [phone]

## 2022-08-07 ENCOUNTER — Ambulatory Visit: Payer: Medicare PPO | Admitting: Internal Medicine

## 2022-08-07 ENCOUNTER — Encounter: Payer: Self-pay | Admitting: Internal Medicine

## 2022-08-07 VITALS — BP 128/78 | HR 68 | Ht 65.0 in | Wt 202.0 lb

## 2022-08-07 DIAGNOSIS — E669 Obesity, unspecified: Secondary | ICD-10-CM

## 2022-08-07 DIAGNOSIS — E782 Mixed hyperlipidemia: Secondary | ICD-10-CM | POA: Diagnosis not present

## 2022-08-07 DIAGNOSIS — E1165 Type 2 diabetes mellitus with hyperglycemia: Secondary | ICD-10-CM | POA: Diagnosis not present

## 2022-08-07 LAB — POCT GLYCOSYLATED HEMOGLOBIN (HGB A1C): Hemoglobin A1C: 7.8 % — AB (ref 4.0–5.6)

## 2022-08-07 NOTE — Chronic Care Management (AMB) (Signed)
  Chronic Care Management   Outreach Note  08/07/2022 Name: Leah Robinson MRN: [id number] DOB: 22-Oct-1951  Leah Robinson is a 71 y.o. year old female who is a primary care patient of Minette Brine, [place]. I reached out to Leah Robinson by phone today in response to a referral sent by Ms. Leah Robinson's primary care provider.  Third unsuccessful telephone outreach was attempted today. The patient was referred to the case management team for assistance with care management and care coordination. The patient's primary care provider has been notified of our unsuccessful attempts to make or maintain contact with the patient. The care management team is pleased to engage with this patient at any time in the future should he/she be interested in assistance from the care management team.   Follow Up Plan: We have been unable to make contact with the patient for follow up. The care management team is available to follow up with the patient after provider conversation with the patient regarding recommendation for care management engagement and subsequent re-referral to the care management team.  A HIPAA compliant phone message was left for the patient providing contact information and requesting a return call.   [place]  Direct Dial: [phone]

## 2022-08-07 NOTE — Patient Instructions (Addendum)
Please try to move: - Metformin ER 1000 mg with dinner  Please return in 4 months with your sugar log.

## 2022-08-07 NOTE — Progress Notes (Addendum)
Patient ID: Leah Robinson, female   DOB: May 12, 1951, 71 y.o.   MRN: [id number]   HPI: Leah White Lohr is a 71 y.o.-year-old female, initially referred by her [place] FNP (Dr. Glendale Chard - [facility]),  Returning for follow-up for DM2, dx as prediabetes in 2016, then DM2, non-insulin-dependent, uncontrolled, without long term complications. Last visit 6 months ago.  Interim history: No increased urination, blurry vision, nausea, chest pain. She has joint pains. He continues to exercise consistently at 5:30 in the morning at the [facility], 5-6 times a week. She also occasionally walks in the evenings.  Reviewed HbA1c levels: Lab Results  Component Value Date   HGBA1C 8.1 (A) 01/31/2022   HGBA1C 8.6 (A) 10/29/2021   HGBA1C 8.8 (H) 04/10/2021  09/05/2019: HbA1c calculated from fructosamine: 7.2% 09/22/2018: HbA1c calculated from fructosamine 6.5% 05/18/2018: HbA1c calculated from fructosamine is much better, at 6.5%. 06/30/2017: HbA1c 7.8% Reportedly, prev. in the 6-7% range.   She is on: - Metform ER 500 >> 1000 mg with dinner-started 10/2021 >> 500 mg 2x with L and D  In 10/2020, Wilder Glade was not affordable. We previously tried Metformin ER 1000 mg with dinner-started 08/2019 >> stopped 09/2019 2/2 AP/constipation. She previously tried metformin IR  -started 2016 >> stopped 2/2 diarrhea and abdominal pain.  Pt checks sugars once a day: - am:  160-170 >> 115 (last week), 150, 169, 182 >> 121-150, 160 - 2h after b'fast: 138, 142 >> n/c >> 140 >> n/c >> 129, 132 >> 97, 143 - before lunch: 140, 170s, 200 >> 122-155 >> 140s >> 99 >> 101-135 - 2h after lunch: 129-141 >> n/c >> 112-142 >> n/c >> 116-136 >> n/c - before dinner: 121-132 >> 119-160 >> 94-113 >> 88, 102-129 - 2h after dinner: 89, 104-140 >> 118 >> n/c >> 108-136 >> 103-140 - bedtime: 134, 149 >> 120 >> n/c >> 126 >> n/c >> 133 >> n/c - nighttime: n/c Lowest: 120 >> ... 88 Highest: 220 >> .Marland KitchenMarland Kitchen  180.  Glucometer: Accu-Chek guide   Pt's meals are: - Breakfast: toast, bacon, cereal, sandwich, ham bisquit, boiled egg - Lunch: salad + nuts, [place] sandwich, tuna - Dinner: greens or veggies, chicken/pork/fish - at ~6:30 pm   -+ Mild CKD, last BUN/creatinine:  Lab Results  Component Value Date   BUN 18 07/22/2022   BUN 14 01/20/2022   CREATININE 0.92 07/22/2022   CREATININE 0.89 01/20/2022  06/30/2017: CMP normal, but with a glucose of 168, BUN/Cr 14/0.86 (GFR 81)  12/10/2016: 10/0.80, eGFR 89, Glu 126  -+ HL. Latest lipids: Lab Results  Component Value Date   CHOL 145 07/22/2022   HDL 53 07/22/2022   LDLCALC 81 07/22/2022   TRIG 50 07/22/2022   CHOLHDL 2.7 07/22/2022  06/30/2017: 223/102/58/145  12/10/2016: 186/83/51/118 On Lipitor 10 mg daily.  - last eye exam was on 08/16/2021: No DR, low grade cataracts. She goes every 3 mo b/c increased IOP - last OV 4 weeks ago. Annual eye exam coming up in 09/2021.  -+ Occasional numbness and tingling in her feet.  Last foot exam 01/20/2022.  Before the coronavirus pandemic, she was going to the gym: swimming, walking, weights, Silver sneakers class-  5-6x a week.  [facility]  ROS: + See HPI Neurological: no tremors/+ numbness/+ tingling/no dizziness  I reviewed pt's medications, allergies, PMH, social hx, family hx, and changes were documented in the history of present illness. Otherwise, unchanged from my initial visit  note.  Past Medical History:  Diagnosis Date   Arthritis    Diabetes mellitus without complication ([place])    Past Surgical History:  Procedure Laterality Date   DILATATION & CURRETTAGE/HYSTEROSCOPY WITH RESECTOCOPE N/A 06/21/2015   Procedure: DILATATION & CURETTAGE/HYSTEROSCOPY WITH RESECTOCOPE;  Surgeon: Servando Salina, MD;  Location: [facility];  Service: Gynecology;  Laterality: N/A;   ROBOTIC ASSISTED SALPINGO OOPHERECTOMY Left 06/21/2015   Procedure: ROBOTIC ASSISTED LEFT  SALPINGO OOPHORECTOMY AND RIGHT SALPINGECTOMY With  Pelvic Washings;  Surgeon: Servando Salina, MD;  Location: [facility];  Service: Gynecology;  Laterality: Left;  2 1/2 hrs.   TUBAL LIGATION     Social History   Social History   Marital status: Married    Spouse name: N/A   Number of children: 1   Occupational History   Retired from [employer]   Social History Main Topics   Smoking status: Former Research scientist (life sciences), quit in [place] tobacco: Never Used   Alcohol use No   Drug use: No   Current Outpatient Medications on File Prior to Visit  Medication Sig Dispense Refill   Accu-Chek FastClix Lancets MISC USE TO CHECK BLOOD SUGAR ONCE DAILY 102 each 3   aspirin (ASPIRIN ADULT LOW STRENGTH) 81 MG chewable tablet Chew 1 tablet (81 mg total) by mouth daily. 30 tablet 1   atorvastatin (LIPITOR) 10 MG tablet Take 1 tablet by mouth daily 90 tablet 3   Blood Glucose Monitoring Suppl (ACCU-CHEK GUIDE) w/Device KIT 1 Device by Does not apply route daily. 1 kit 0   cholecalciferol (VITAMIN D) 1000 UNITS tablet Take 1,000 Units by mouth daily.     glucose blood (ACCU-CHEK GUIDE) test strip USE AS DIRECTED BLOOD SUGAR ONCE DAILY 100 strip 1   metFORMIN (GLUCOPHAGE-XR) 500 MG 24 hr tablet Take 2 tablets (1,000 mg total) by mouth daily with supper. 180 tablet 3   Multiple Vitamin (MULTIVITAMIN WITH MINERALS) TABS tablet Take 1 tablet by mouth daily.     No current facility-administered medications on file prior to visit.   No Known Allergies   Pertinent FH: Pt has FH of DM in mother, maternal uncle.  PE: BP 128/78 (BP Location: Right Arm, Patient Position: Sitting, Cuff Size: Normal)   Pulse 68   Ht $R'5\' 5"'TZ$  (1.651 m)   Wt 202 lb (91.6 kg)   SpO2 95%   BMI 33.61 kg/m  Wt Readings from Last 3 Encounters:  08/07/22 202 lb (91.6 kg)  07/22/22 203 lb (92.1 kg)  01/31/22 205 lb 3.2 oz (93.1 kg)   Constitutional: overweight, in NAD Eyes: no exophthalmos ENT: moist mucous membranes, no masses  palpated in neck, no cervical lymphadenopathy Cardiovascular: RRR, No MRG Respiratory: CTA B Musculoskeletal: no deformities Skin: moist, warm, no rashes Neurological: no tremor with outstretched hands  ASSESSMENT: 1. DM2, non-insulin-dependent, without long term complications, but with hyperglycemia  2. HL  3.  Obesity class II  PLAN:  1. Patient with previously well-controlled diabetes, on half maximal dose of metformin, which she did not tolerate at higher doses due to abdominal pain and diarrhea.  She tried to come off metformin but her HbA1c increased at the time when she also relaxed her diet.  We tried to start an SGLT2 inhibitor Wilder Glade) but this was too expensive.  She refused injectables.  She started to improve her diet and sugars improved, at last visit, HbA1c was 8.1%, which was higher than expected from her blood sugars at home.  Since sugars are occasionally  higher than target in the morning, I advised her to add light exercise in the evening but we did not change the regimen. -At today's visit, sugars are at goal later in the day, but they are still slightly higher in the morning.  She is not documenting quite well after blood sugars in her log and she tells me that she occasionally has sugars in the 150s and even up to 160 in the morning.  Upon questioning, she is either taking the metformin in the morning, or at night, or splits the dose.  We discussed about trying to take the entire dose of metformin with dinner, which will help with blood sugars in the morning.  She agrees to do this, since she noticed that taking both metformin ER tablets together does not bother her stomach anymore. - I suggested to:  Patient Instructions  Please try to move: - Metformin ER 1000 mg with dinner  Please return in 4 months with your sugar log.  - we checked her HbA1c: 7.8% (lower) - advised to check sugars at different times of the day - 1x a day, rotating check times - advised for  yearly eye exams >> she is UTD - return to clinic in 4 months   2. HL -Reviewed latest lipid panel from earlier this month: Fractions at goal: Lab Results  Component Value Date   CHOL 145 07/22/2022   HDL 53 07/22/2022   LDLCALC 81 07/22/2022   TRIG 50 07/22/2022   CHOLHDL 2.7 07/22/2022  -On Lipitor 10 mg daily without side effects  3.  Obesity class II -She continues to exercise at the gym.  At last visit, I recommended light exercise (walking) in the evening, too >> she is doing this occasionally -She also stopped eating fried foods -Weight stable at last visit, now lost 3 pounds  [name], MD PhD [facility]

## 2022-08-08 ENCOUNTER — Ambulatory Visit
Admission: RE | Admit: 2022-08-08 | Discharge: 2022-08-08 | Disposition: A | Discharge status: Home / Self Care | Payer: Medicare PPO | Source: Ambulatory Visit | Attending: Internal Medicine | Admitting: Internal Medicine

## 2022-08-08 DIAGNOSIS — Z1231 Encounter for screening mammogram for malignant neoplasm of breast: Secondary | ICD-10-CM

## 2022-10-07 DIAGNOSIS — H40013 Open angle with borderline findings, low risk, bilateral: Secondary | ICD-10-CM | POA: Diagnosis not present

## 2022-11-12 ENCOUNTER — Ambulatory Visit: Payer: Medicare PPO | Admitting: Nurse Practitioner

## 2022-11-12 ENCOUNTER — Ambulatory Visit (INDEPENDENT_AMBULATORY_CARE_PROVIDER_SITE_OTHER): Payer: Medicare PPO

## 2022-11-12 ENCOUNTER — Ambulatory Visit: Payer: Medicare PPO

## 2022-11-12 VITALS — Ht 65.0 in | Wt 202.0 lb

## 2022-11-12 DIAGNOSIS — Z0000 Encounter for general adult medical examination without abnormal findings: Secondary | ICD-10-CM | POA: Diagnosis not present

## 2022-11-12 NOTE — Patient Instructions (Signed)
Ms. Hansell , Thank you for taking time to come for your Medicare Wellness Visit. I appreciate your ongoing commitment to your health goals. Please review the following plan we discussed and let me know if I can assist you in the future.   Screening recommendations/referrals: Colonoscopy: not required Mammogram: completed 08/08/2022, due 08/10/2023 Bone Density: completed 10/29/2018 Recommended yearly ophthalmology/optometry visit for glaucoma screening and checkup Recommended yearly dental visit for hygiene and checkup  Vaccinations: Influenza vaccine: decline Pneumococcal vaccine: decline Tdap vaccine: completed 08/30/2018, due 08/30/2028 Shingles vaccine: decline  Covid-19: 10/15/2022, 09/25/2021, 04/24/2021, 09/28/2020, 02/17/2020, 01/27/2020  Advanced directives: Please bring a copy of your POA (Power of Attorney) and/or Living Will to your next appointment.   Conditions/risks identified: none  Next appointment: Follow up in one year for your annual wellness visit    Preventive Care 65 Years and Older, Female Preventive care refers to lifestyle choices and visits with your health care provider that can promote health and wellness. What does preventive care include? A yearly physical exam. This is also called an annual well check. Dental exams once or twice a year. Routine eye exams. Ask your health care provider how often you should have your eyes checked. Personal lifestyle choices, including: Daily care of your teeth and gums. Regular physical activity. Eating a healthy diet. Avoiding tobacco and drug use. Limiting alcohol use. Practicing safe sex. Taking low-dose aspirin every day. Taking vitamin and mineral supplements as recommended by your health care provider. What happens during an annual well check? The services and screenings done by your health care provider during your annual well check will depend on your age, overall health, lifestyle risk factors, and family history of  disease. Counseling  Your health care provider may ask you questions about your: Alcohol use. Tobacco use. Drug use. Emotional well-being. Home and relationship well-being. Sexual activity. Eating habits. History of falls. Memory and ability to understand (cognition). Work and work Statistician. Reproductive health. Screening  You may have the following tests or measurements: Height, weight, and BMI. Blood pressure. Lipid and cholesterol levels. These may be checked every 5 years, or more frequently if you are over 37 years old. Skin check. Lung cancer screening. You may have this screening every year starting at age 25 if you have a 30-pack-year history of smoking and currently smoke or have quit within the past 15 years. Fecal occult blood test (FOBT) of the stool. You may have this test every year starting at age 105. Flexible sigmoidoscopy or colonoscopy. You may have a sigmoidoscopy every 5 years or a colonoscopy every 10 years starting at age 22. Hepatitis C blood test. Hepatitis B blood test. Sexually transmitted disease (STD) testing. Diabetes screening. This is done by checking your blood sugar (glucose) after you have not eaten for a while (fasting). You may have this done every 1-3 years. Bone density scan. This is done to screen for osteoporosis. You may have this done starting at age 27. Mammogram. This may be done every 1-2 years. Talk to your health care provider about how often you should have regular mammograms. Talk with your health care provider about your test results, treatment options, and if necessary, the need for more tests. Vaccines  Your health care provider may recommend certain vaccines, such as: Influenza vaccine. This is recommended every year. Tetanus, diphtheria, and acellular pertussis (Tdap, Td) vaccine. You may need a Td booster every 10 years. Zoster vaccine. You may need this after age 36. Pneumococcal 13-valent conjugate (PCV13) vaccine.  One  dose is recommended after age 6. Pneumococcal polysaccharide (PPSV23) vaccine. One dose is recommended after age 77. Talk to your health care provider about which screenings and vaccines you need and how often you need them. This information is not intended to replace advice given to you by your health care provider. Make sure you discuss any questions you have with your health care provider. Document Released: 01/04/2016 Document Revised: 08/27/2016 Document Reviewed: 10/09/2015 Elsevier Interactive Patient Education  2017 [place] Prevention in the Home Falls can cause injuries. They can happen to people of all ages. There are many things you can do to make your home safe and to help prevent falls. What can I do on the outside of my home? Regularly fix the edges of walkways and driveways and fix any cracks. Remove anything that might make you trip as you walk through a door, such as a raised step or threshold. Trim any bushes or trees on the path to your home. Use bright outdoor lighting. Clear any walking paths of anything that might make someone trip, such as rocks or tools. Regularly check to see if handrails are loose or broken. Make sure that both sides of any steps have handrails. Any raised decks and porches should have guardrails on the edges. Have any leaves, snow, or ice cleared regularly. Use sand or salt on walking paths during winter. Clean up any spills in your garage right away. This includes oil or grease spills. What can I do in the bathroom? Use night lights. Install grab bars by the toilet and in the tub and shower. Do not use towel bars as grab bars. Use non-skid mats or decals in the tub or shower. If you need to sit down in the shower, use a plastic, non-slip stool. Keep the floor dry. Clean up any water that spills on the floor as soon as it happens. Remove soap buildup in the tub or shower regularly. Attach bath mats securely with double-sided  non-slip rug tape. Do not have throw rugs and other things on the floor that can make you trip. What can I do in the bedroom? Use night lights. Make sure that you have a light by your bed that is easy to reach. Do not use any sheets or blankets that are too big for your bed. They should not hang down onto the floor. Have a firm chair that has side arms. You can use this for support while you get dressed. Do not have throw rugs and other things on the floor that can make you trip. What can I do in the kitchen? Clean up any spills right away. Avoid walking on wet floors. Keep items that you use a lot in easy-to-reach places. If you need to reach something above you, use a strong step stool that has a grab bar. Keep electrical cords out of the way. Do not use floor polish or wax that makes floors slippery. If you must use wax, use non-skid floor wax. Do not have throw rugs and other things on the floor that can make you trip. What can I do with my stairs? Do not leave any items on the stairs. Make sure that there are handrails on both sides of the stairs and use them. Fix handrails that are broken or loose. Make sure that handrails are as long as the stairways. Check any carpeting to make sure that it is firmly attached to the stairs. Fix any carpet that is loose or  worn. Avoid having throw rugs at the top or bottom of the stairs. If you do have throw rugs, attach them to the floor with carpet tape. Make sure that you have a light switch at the top of the stairs and the bottom of the stairs. If you do not have them, ask someone to add them for you. What else can I do to help prevent falls? Wear shoes that: Do not have high heels. Have rubber bottoms. Are comfortable and fit you well. Are closed at the toe. Do not wear sandals. If you use a stepladder: Make sure that it is fully opened. Do not climb a closed stepladder. Make sure that both sides of the stepladder are locked into place. Ask  someone to hold it for you, if possible. Clearly mark and make sure that you can see: Any grab bars or handrails. First and last steps. Where the edge of each step is. Use tools that help you move around (mobility aids) if they are needed. These include: Canes. Walkers. Scooters. Crutches. Turn on the lights when you go into a dark area. Replace any light bulbs as soon as they burn out. Set up your furniture so you have a clear path. Avoid moving your furniture around. If any of your floors are uneven, fix them. If there are any pets around you, be aware of where they are. Review your medicines with your doctor. Some medicines can make you feel dizzy. This can increase your chance of falling. Ask your doctor what other things that you can do to help prevent falls. This information is not intended to replace advice given to you by your health care provider. Make sure you discuss any questions you have with your health care provider. Document Released: 10/04/2009 Document Revised: 05/15/2016 Document Reviewed: 01/12/2015 Elsevier Interactive Patient Education  2017 [employer].

## 2022-11-12 NOTE — Progress Notes (Signed)
I connected with Leah Christiana today by telephone and verified that I am speaking with the correct person using two identifiers. Location patient: home Location provider: work Persons participating in the virtual visit: Leah Alcindor, Karo Rog LPN.   I discussed the limitations, risks, security and privacy concerns of performing an evaluation and management service by telephone and the availability of in person appointments. I also discussed with the patient that there may be a patient responsible charge related to this service. The patient expressed understanding and verbally consented to this telephonic visit.    Interactive audio and video telecommunications were attempted between this provider and patient, however failed, due to patient having technical difficulties OR patient did not have access to video capability.  We continued and completed visit with audio only.     Vital signs may be patient reported or missing.  Subjective:   Leah Robinson is a 71 y.o. female who presents for Medicare Annual (Subsequent) preventive examination.  Review of Systems     Cardiac Risk Factors include: advanced age (>7mn, >>42women);diabetes mellitus;dyslipidemia;obesity (BMI >30kg/m2)     Objective:    Today's Vitals   11/12/22 1300  Weight: 202 lb (91.6 kg)  Height: _0  (1.651 m)   Body mass index is 33.61 kg/m.     11/12/2022    1:05 PM 10/23/2021    8:31 AM 10/10/2020    8:49 AM 01/10/2020   10:20 AM 08/31/2019    9:25 AM 06/21/2015   10:32 AM 06/15/2015    9:45 AM  Advanced Directives  Does Patient Have a Medical Advance Directive? _1  No No  Type of AParamed[place]of [place] will [place] will [place] Junction[place] will Healthcare Power of [place] HillsLiving will    Does patient want to make changes to medical advance directive?    No - Patient declined      Copy of HLithoniain Chart? No - copy requested No - copy requested No - copy requested  No - copy requested    Would patient like information on creating a medical advance directive?       Yes - Educational materials given    Current Medications (verified) Outpatient Encounter Medications as of 11/12/2022  Medication Sig   Accu-Chek FastClix Lancets MISC USE TO CHECK BLOOD SUGAR ONCE DAILY   aspirin (ASPIRIN ADULT LOW STRENGTH) 81 MG chewable tablet Chew 1 tablet (81 mg total) by mouth daily. (Patient taking differently: Chew 81 mg by mouth daily. Takes every other day)   atorvastatin (LIPITOR) 10 MG tablet Take 1 tablet by mouth daily   Blood Glucose Monitoring Suppl (ACCU-CHEK GUIDE) w/Device KIT 1 Device by Does not apply route daily.   cholecalciferol (VITAMIN D) 1000 UNITS tablet Take 1,000 Units by mouth daily.   glucose blood (ACCU-CHEK GUIDE) test strip USE AS DIRECTED BLOOD SUGAR ONCE DAILY   metFORMIN (GLUCOPHAGE-XR) 500 MG 24 hr tablet Take 2 tablets (1,000 mg total) by mouth daily with supper.   Multiple Vitamin (MULTIVITAMIN WITH MINERALS) TABS tablet Take 1 tablet by mouth daily.   No facility-administered encounter medications on file as of 11/12/2022.    Allergies (verified) Patient has no known allergies.   History: Past Medical History:  Diagnosis Date   Arthritis    Diabetes mellitus without complication ([place]    Past Surgical History:  Procedure Laterality Date   DILATATION & CURRETTAGE/HYSTEROSCOPY WITH RESECTOCOPE  N/A 06/21/2015   Procedure: DILATATION & CURETTAGE/HYSTEROSCOPY WITH RESECTOCOPE;  Surgeon: Servando Salina, MD;  Location: [facility];  Service: Gynecology;  Laterality: N/A;   ROBOTIC ASSISTED SALPINGO OOPHERECTOMY Left 06/21/2015   Procedure: ROBOTIC ASSISTED LEFT SALPINGO OOPHORECTOMY AND RIGHT SALPINGECTOMY With  Pelvic Washings;  Surgeon: Servando Salina, MD;  Location: [facility];  Service: Gynecology;  Laterality: Left;  2 1/2  hrs.   TUBAL LIGATION     Family History  Problem Relation Age of Onset   Breast cancer Neg Hx    Social History   Socioeconomic History   Marital status: Married    Spouse name: Not on file   Number of children: Not on file   Years of education: Not on file   Highest education level: Not on file  Occupational History   Occupation: retired   Occupation: part time work  Tobacco Use   Smoking status: Former   Smokeless tobacco: Never  Scientific laboratory technician Use: Never used  Substance and Sexual Activity   Alcohol use: No   Drug use: No   Sexual activity: Not Currently  Other Topics Concern   Not on file  Social History Narrative   Not on file   Social Determinants of Health   Financial Resource Strain: [place]  (11/12/2022)   Overall Financial Resource Strain (CARDIA)    Difficulty of Paying Living Expenses: Not hard at all  Food Insecurity: No Food Insecurity (11/12/2022)   Hunger Vital Sign    Worried About Running Out of Food in the Last Year: Never true    [place] in the Last Year: Never true  Transportation Needs: No Transportation Needs (11/12/2022)   PRAPARE - Hydrologist (Medical): No    Lack of Transportation (Non-Medical): No  Physical Activity: Sufficiently Active (11/12/2022)   Exercise Vital Sign    Days of Exercise per Week: 6 days    Minutes of Exercise per Session: 90 min  Stress: No Stress Concern Present (11/12/2022)   [place]    Feeling of Stress : Not at all  Social Connections: Not on file    Tobacco Counseling Counseling given: Not Answered   Clinical Intake:  Pre-visit preparation completed: Yes  Pain : No/denies pain     Nutritional Status: BMI > 30  Obese Nutritional Risks: None Diabetes: Yes  How often do you need to have someone help you when you read instructions, pamphlets, or other written materials from your doctor  or pharmacy?: 1 - Never  Diabetic? Yes Nutrition Risk Assessment:  Has the patient had any N/V/D within the last 2 months?  No  Does the patient have any non-healing wounds?  No  Has the patient had any unintentional weight loss or weight gain?  No   Diabetes:  Is the patient diabetic?  Yes  If diabetic, was a CBG obtained today?  No  Did the patient bring in their glucometer from home?  No  How often do you monitor your CBG's? daily.   Financial Strains and Diabetes Management:  Are you having any financial strains with the device, your supplies or your medication? No .  Does the patient want to be seen by Chronic Care Management for management of their diabetes?  No  Would the patient like to be referred to a Nutritionist or for Diabetic Management?  No   Diabetic Exams:  Diabetic Eye Exam: Completed 2023  Diabetic Foot Exam: Completed 01/20/2022   Interpreter Needed?: No  Information entered by :: NAllen LPN   Activities of Daily Living    11/12/2022    1:07 PM  In your present state of health, do you have any difficulty performing the following activities:  Hearing? 0  Vision? 0  Difficulty concentrating or making decisions? 0  Walking or climbing stairs? 0  Dressing or bathing? 0  Doing errands, shopping? 0  Preparing Food and eating ? N  Using the Toilet? N  In the past six months, have you accidently leaked urine? N  Do you have problems with loss of bowel control? N  Managing your Medications? N  Managing your Finances? N  Housekeeping or managing your Housekeeping? N    Patient Care Team: Minette Brine, FNP as PCP - General (General Practice)  Indicate any recent Medical Services you may have received from other than [facility] providers in the past year (date may be approximate).     Assessment:   This is a routine wellness examination for Leah.  Hearing/Vision screen Vision Screening - Comments:: Regular eye exams, [facility]  Dietary issues  and exercise activities discussed: Current Exercise Habits: Home exercise routine, Type of exercise: walking;strength training/weights;Other - see comments (swimming), Time (Minutes): > 60, Frequency (Times/Week): 6, Weekly Exercise (Minutes/Week): 0   Goals Addressed             This Visit's Progress    Patient Stated       11/12/2022, no goals       Depression Screen    11/12/2022    1:07 PM 10/23/2021    8:32 AM 10/10/2020    8:52 AM 02/27/2020    8:39 AM 01/18/2020   12:01 PM 11/22/2019    3:54 PM 08/31/2019    9:25 AM  PHQ 2/9 Scores  PHQ - 2 Score 0 0 0 0 0 0 0  PHQ- 9 Score       0    Fall Risk    11/12/2022    1:06 PM 10/23/2021    8:32 AM 10/10/2020    8:51 AM 02/27/2020    8:39 AM 02/08/2020    9:23 AM  [place] in the past year? 0 0 0 0 0  Number falls in past yr: 0      Injury with Fall? 0      Risk for fall due to : Medication side effect No Fall Risks Medication side effect    Follow up Falls prevention discussed;Education provided;Falls evaluation completed Falls evaluation completed;Education provided;Falls prevention discussed Falls evaluation completed;Education provided;Falls prevention discussed      FALL RISK PREVENTION PERTAINING TO THE HOME:  Any stairs in or around the home? No  If so, are there any without handrails? N/a Home free of loose throw rugs in walkways, pet beds, electrical cords, etc? Yes  Adequate lighting in your home to reduce risk of falls? Yes   ASSISTIVE DEVICES UTILIZED TO PREVENT FALLS:  Life alert? No  Use of a cane, walker or w/c? No  Grab bars in the bathroom? Yes  Shower chair or bench in shower? Yes  Elevated toilet seat or a handicapped toilet? Yes   TIMED UP AND GO:  Was the test performed? No .      Cognitive Function:        11/12/2022    1:08 PM 10/23/2021    8:34 AM 10/10/2020    8:53 AM 08/31/2019  9:27 AM  6CIT Screen  What Year? 0 points 0 points 0 points 0 points  What month? 0  points 0 points 0 points 0 points  What time? 0 points 0 points 0 points 0 points  Count back from 20 0 points 0 points 0 points 0 points  Months in reverse 0 points 2 points 0 points 0 points  Repeat phrase 2 points 0 points 0 points 0 points  Total Score 2 points 2 points 0 points 0 points    Immunizations Immunization History  Administered Date(s) Administered   Fluad Quad(high Dose 65+) 10/10/2020   Influenza, High Dose Seasonal PF 09/22/2018, 08/31/2019   PFIZER(Purple Top)SARS-COV-2 Vaccination 01/27/2020, 02/17/2020, 09/28/2020, 04/24/2021   Pfizer Covid-19 Vaccine Bivalent Booster 43yr & up 09/25/2021   Tdap 08/30/2018   Unspecified SARS-COV-2 Vaccination 10/15/2022    TDAP status: Up to date  Flu Vaccine status: Declined, Education has been provided regarding the importance of this vaccine but patient still declined. Advised may receive this vaccine at local pharmacy or Health Dept. Aware to provide a copy of the vaccination record if obtained from local pharmacy or Health Dept. Verbalized acceptance and understanding.  Pneumococcal vaccine status: Declined,  Education has been provided regarding the importance of this vaccine but patient still declined. Advised may receive this vaccine at local pharmacy or Health Dept. Aware to provide a copy of the vaccination record if obtained from local pharmacy or Health Dept. Verbalized acceptance and understanding.   Covid-19 vaccine status: Completed vaccines  Qualifies for Shingles Vaccine? Yes   Zostavax completed No   Shingrix Completed?: No.    Education has been provided regarding the importance of this vaccine. Patient has been advised to call insurance company to determine out of pocket expense if they have not yet received this vaccine. Advised may also receive vaccine at local pharmacy or Health Dept. Verbalized acceptance and understanding.  Screening Tests Health Maintenance  Topic Date Due   OPHTHALMOLOGY EXAM   08/16/2022   Medicare Annual Wellness (AWV)  10/23/2022   Zoster Vaccines- Shingrix (1 of 2) 02/12/2023 (Originally 02/26/1970)   INFLUENZA VACCINE  03/22/2023 (Originally 07/22/2022)   Pneumonia Vaccine 71 Years old (1 - PCV) 11/13/2023 (Originally 02/27/2016)   COVID-19 Vaccine (7 - 2023-24 season) 12/10/2022   Diabetic kidney evaluation - Urine ACR  01/20/2023   FOOT EXAM  01/20/2023   HEMOGLOBIN A1C  02/07/2023   Diabetic kidney evaluation - GFR measurement  07/23/2023   MAMMOGRAM  08/08/2024   COLONOSCOPY (Pts 45-422yrInsurance coverage will need to be confirmed)  05/24/2031   DEXA SCAN  Completed   Hepatitis C Screening  Completed   HPV VACCINES  Aged Out    Health Maintenance  Health Maintenance Due  Topic Date Due   OPHTHALMOLOGY EXAM  08/16/2022   Medicare Annual Wellness (AWV)  10/23/2022    Colorectal cancer screening: No longer required.   Mammogram status: Completed 08/08/2022. Repeat every year  Bone Density status: Completed 10/29/2018.   Lung Cancer Screening: (Low Dose CT Chest recommended if Age 644-80ears, 30 pack-year currently smoking OR have quit w/in 15years.) does not qualify.   Lung Cancer Screening Referral: no  Additional Screening:  Hepatitis C Screening: does qualify; Completed 08/31/2019  Vision Screening: Recommended annual ophthalmology exams for early detection of glaucoma and other disorders of the eye. Is the patient up to date with their annual eye exam?  Yes  Who is the provider or what is the name of the  office in which the patient attends annual eye exams? [facility] If pt is not established with a provider, would they like to be referred to a provider to establish care? No .   Dental Screening: Recommended annual dental exams for proper oral hygiene  Community Resource Referral / Chronic Care Management: CRR required this visit?  No   CCM required this visit?  No      Plan:     I have personally reviewed and noted the  following in the patient's chart:   Medical and social history Use of alcohol, tobacco or illicit drugs  Current medications and supplements including opioid prescriptions. Patient is not currently taking opioid prescriptions. Functional ability and status Nutritional status Physical activity Advanced directives List of other physicians Hospitalizations, surgeries, and ER visits in previous 12 months Vitals Screenings to include cognitive, depression, and falls Referrals and appointments  In addition, I have reviewed and discussed with patient certain preventive protocols, quality metrics, and best practice recommendations. A written personalized care plan for preventive services as well as general preventive health recommendations were provided to patient.     Kellie Simmering, LPN   53/20/2334   Nurse Notes: none  Due to this being a virtual visit, the after visit summary with patients personalized plan was offered to patient via mail or my-chart.  Patient would like to access on my-chart

## 2022-12-03 ENCOUNTER — Ambulatory Visit: Payer: Medicare PPO | Admitting: Internal Medicine

## 2023-01-27 ENCOUNTER — Ambulatory Visit: Payer: Medicare PPO | Admitting: Internal Medicine

## 2023-02-02 DIAGNOSIS — K5730 Diverticulosis of large intestine without perforation or abscess without bleeding: Secondary | ICD-10-CM | POA: Insufficient documentation

## 2023-02-03 ENCOUNTER — Encounter: Payer: Self-pay | Admitting: Nurse Practitioner

## 2023-02-03 ENCOUNTER — Ambulatory Visit (INDEPENDENT_AMBULATORY_CARE_PROVIDER_SITE_OTHER): Payer: Medicare Other | Admitting: Nurse Practitioner

## 2023-02-03 VITALS — BP 128/72 | HR 68 | Temp 98.5°F | Ht 65.0 in | Wt 204.0 lb

## 2023-02-03 DIAGNOSIS — Z0000 Encounter for general adult medical examination without abnormal findings: Secondary | ICD-10-CM

## 2023-02-03 DIAGNOSIS — E1151 Type 2 diabetes mellitus with diabetic peripheral angiopathy without gangrene: Secondary | ICD-10-CM | POA: Diagnosis not present

## 2023-02-03 DIAGNOSIS — L02828 Furuncle of other sites: Secondary | ICD-10-CM | POA: Diagnosis not present

## 2023-02-03 DIAGNOSIS — Z79899 Other long term (current) drug therapy: Secondary | ICD-10-CM

## 2023-02-03 DIAGNOSIS — E782 Mixed hyperlipidemia: Secondary | ICD-10-CM | POA: Diagnosis not present

## 2023-02-03 DIAGNOSIS — N491 Inflammatory disorders of spermatic cord, tunica vaginalis and vas deferens: Secondary | ICD-10-CM

## 2023-02-03 DIAGNOSIS — Z1321 Encounter for screening for nutritional disorder: Secondary | ICD-10-CM

## 2023-02-03 DIAGNOSIS — R051 Acute cough: Secondary | ICD-10-CM

## 2023-02-03 MED ORDER — CEFTRIAXONE SODIUM 500 MG IJ SOLR
500.0000 mg | Freq: Once | INTRAMUSCULAR | Status: AC
  Administered 2023-02-03: 500 mg via INTRAMUSCULAR

## 2023-02-03 MED ORDER — BENZONATATE 100 MG PO CAPS: 100.0000 mg | ORAL_CAPSULE | Freq: Three times a day (TID) | ORAL | 1 refills | Status: DC | PRN

## 2023-02-03 MED ORDER — CEPHALEXIN 500 MG PO CAPS: 500.0000 mg | ORAL_CAPSULE | Freq: Four times a day (QID) | ORAL | 0 refills | Status: AC

## 2023-02-03 NOTE — Progress Notes (Signed)
I,Sheena H Holbrook,acting as a Education administrator for Minette Brine, FNP.,have documented all relevant documentation on the behalf of Minette Brine, FNP,as directed by  Minette Brine, FNP while in the presence of Minette Brine, [place].   Subjective:     Patient ID: Leah Robinson , female    DOB: 1951/01/26 , 72 y.o.   MRN: [id number]   Chief Complaint  Patient presents with   Annual Exam    HPI  Patient presents today for annual exam. Patient reports possible abscess on right buttock, present since last week; has drained and seems to be improving. Patient also reports ongoing cough for several weeks; denies shob/wheeze, fever/chills.   She is due to see the Endocrinologist in April for her diabetes management. Blood sugars are ranging 82-154. She does not want her HgbA1c checked here today.      Past Medical History:  Diagnosis Date   Arthritis    Diabetes mellitus without complication (HCC)      Family History  Problem Relation Age of Onset   Breast cancer Neg Hx      Current Outpatient Medications:    Accu-Chek FastClix Lancets MISC, USE TO CHECK BLOOD SUGAR ONCE DAILY, Disp: 102 each, Rfl: 3   aspirin (ASPIRIN ADULT LOW STRENGTH) 81 MG chewable tablet, Chew 1 tablet (81 mg total) by mouth daily. (Patient taking differently: Chew 81 mg by mouth daily. Takes every other day), Disp: 30 tablet, Rfl: 1   atorvastatin (LIPITOR) 10 MG tablet, Take 1 tablet by mouth daily, Disp: 90 tablet, Rfl: 3   benzonatate (TESSALON PERLES) 100 MG capsule, Take 1 capsule (100 mg total) by mouth 3 (three) times daily as needed for cough., Disp: 30 capsule, Rfl: 1   Blood Glucose Monitoring Suppl (ACCU-CHEK GUIDE) w/Device KIT, 1 Device by Does not apply route daily., Disp: 1 kit, Rfl: 0   cholecalciferol (VITAMIN D) 1000 UNITS tablet, Take 1,000 Units by mouth daily., Disp: , Rfl:    glucose blood (ACCU-CHEK GUIDE) test strip, USE AS DIRECTED BLOOD SUGAR ONCE DAILY, Disp: 100 strip, Rfl: 1   metFORMIN  (GLUCOPHAGE-XR) 500 MG 24 hr tablet, Take 2 tablets (1,000 mg total) by mouth daily with supper., Disp: 180 tablet, Rfl: 3   Multiple Vitamin (MULTIVITAMIN WITH MINERALS) TABS tablet, Take 1 tablet by mouth daily., Disp: , Rfl:    No Known Allergies    The patient states she is post menopausal status.  No LMP recorded. Patient is postmenopausal.. Negative for Dysmenorrhea and Negative for Menorrhagia. Negative for: breast discharge, breast lump(s), breast pain and breast self exam. Associated symptoms include abnormal vaginal bleeding. Pertinent negatives include abnormal bleeding (hematology), anxiety, decreased libido, depression, difficulty falling sleep, dyspareunia, history of infertility, nocturia, sexual dysfunction, sleep disturbances, urinary incontinence, urinary urgency, vaginal discharge and vaginal itching. Diet regular - goes between healthy and unhealthy. The patient states her exercise level is moderate - swimming 6 days a week.    The patient's tobacco use is:  Social History   Tobacco Use  Smoking Status Former  Smokeless Tobacco Never  . She has been exposed to passive smoke. The patient's alcohol use is:  Social History   Substance and Sexual Activity  Alcohol Use No    Review of Systems  Constitutional: Negative.   Respiratory:  Positive for cough (persistent cough).   Cardiovascular: Negative.   Genitourinary:  Positive for vaginal pain (boil to inner area of vagina noticed last week - has been treating with hot water with compresses  and feels has opened up.).  Musculoskeletal: Negative.   Neurological: Negative.   Psychiatric/Behavioral: Negative.       Today's Vitals   02/03/23 0851  BP: 128/72  Pulse: 68  Temp: 98.5 F (36.9 C)  TempSrc: Oral  SpO2: 97%  Weight: 204 lb (92.5 kg)  Height: '5\' 5"'$  (1.651 m)   Body mass index is 33.95 kg/m.   Objective:  Physical Exam Vitals reviewed.  Constitutional:      General: She is not in acute distress.     Appearance: Normal appearance. She is well-developed. She is obese.  HENT:     Head: Normocephalic and atraumatic.     Right Ear: Hearing, tympanic membrane, ear canal and external ear normal. There is no impacted cerumen.     Left Ear: Hearing, tympanic membrane, ear canal and external ear normal. There is no impacted cerumen.     Nose:     Comments: Deferred - masked    Mouth/Throat:     Comments: Deferred - masked Eyes:     General: Lids are normal.     Extraocular Movements: Extraocular movements intact.     Conjunctiva/sclera: Conjunctivae normal.     Pupils: Pupils are equal, round, and reactive to light.     Funduscopic exam:    Right eye: No papilledema.        Left eye: No papilledema.  Neck:     Thyroid: No thyroid mass.     Vascular: No carotid bruit.  Cardiovascular:     Rate and Rhythm: Normal rate and regular rhythm.     Pulses: Normal pulses.     Heart sounds: Normal heart sounds. No murmur heard. Pulmonary:     Effort: Pulmonary effort is normal. No respiratory distress.     Breath sounds: Normal breath sounds. No wheezing.  Chest:     Chest wall: No mass.  Breasts:    Tanner Score is 5.     Right: Normal. No mass or tenderness.     Left: Normal. No mass or tenderness.  Abdominal:     General: Abdomen is flat. Bowel sounds are normal. There is no distension.     Palpations: Abdomen is soft.     Tenderness: There is no abdominal tenderness.  Genitourinary:    Rectum: Guaiac result negative.  Musculoskeletal:        General: No swelling or tenderness. Normal range of motion.     Cervical back: Full passive range of motion without pain, normal range of motion and neck supple.     Right lower leg: No edema.     Left lower leg: No edema.  Lymphadenopathy:     Upper Body:     Right upper body: No supraclavicular, axillary or pectoral adenopathy.     Left upper body: No supraclavicular, axillary or pectoral adenopathy.  Skin:    General: Skin is warm and dry.      Capillary Refill: Capillary refill takes less than 2 seconds.     Comments: Boil to right inner area at vulva area, firm and measures fully approximately 5cm  x 3cm, unable to expectorate any exudate.   Neurological:     General: No focal deficit present.     Mental Status: She is alert and oriented to person, place, and time.     Cranial Nerves: No cranial nerve deficit.     Sensory: No sensory deficit.  Psychiatric:        Mood and Affect: Mood normal.  Behavior: Behavior normal.        Thought Content: Thought content normal.        Judgment: Judgment normal.         Assessment And Plan:     1. Encounter for health maintenance examination - CBC  2. Encounter for vitamin deficiency screening - VITAMIN D 25 Hydroxy (Vit-D Deficiency, Fractures)  3. Type 2 diabetes mellitus with diabetic peripheral angiopathy without gangrene, without long-term current use of insulin (HCC) Comments: Continue follow-up with endocrinologist.  She is to have her hemoglobin A1c checked at Dr. Renne Crigler - Microalbumin / Creatinine Urine Ratio - CMP14+EGFR  4. Mixed hyperlipidemia Comments: Cholesterol levels are stable.  Continue statin tolerating well. - CMP14+EGFR - Lipid panel  5. Boil of tunica vaginalis Comments: Will treat with Rocephin 500 mg and oral cephalexin. Return in 2 days to recheck area. Monitor blood sugars. - CBC with Differential/Platelet - cefTRIAXone (ROCEPHIN) injection 500 mg - cephALEXin (KEFLEX) 500 MG capsule; Take 1 capsule (500 mg total) by mouth 4 (four) times daily for 10 days.  Dispense: 40 capsule; Refill: 0  6. Other long term (current) drug therapy - CBC with Differential/Platelet - TSH  7. Acute cough - benzonatate (TESSALON PERLES) 100 MG capsule; Take 1 capsule (100 mg total) by mouth 3 (three) times daily as needed for cough.  Dispense: 30 capsule; Refill: 1   Patient was given opportunity to ask questions. Patient verbalized understanding of  the plan and was able to repeat key elements of the plan. All questions were answered to their satisfaction.   Minette Brine, FNP   I, Minette Brine, FNP, have reviewed all documentation for this visit. The documentation on 02/03/23 for the exam, diagnosis, procedures, and orders are all accurate and complete.   THE PATIENT IS ENCOURAGED TO PRACTICE SOCIAL DISTANCING DUE TO THE COVID-19 PANDEMIC.

## 2023-02-03 NOTE — Patient Instructions (Addendum)
Health Maintenance, Female Adopting a healthy lifestyle and getting preventive care are important in promoting health and wellness. Ask your health care provider about: The right schedule for you to have regular tests and exams. Things you can do on your own to prevent diseases and keep yourself healthy. What should I know about diet, weight, and exercise? Eat a healthy diet  Eat a diet that includes plenty of vegetables, fruits, low-fat dairy products, and lean protein. Do not eat a lot of foods that are high in solid fats, added sugars, or sodium. Maintain a healthy weight Body mass index (BMI) is used to identify weight problems. It estimates body fat based on height and weight. Your health care provider can help determine your BMI and help you achieve or maintain a healthy weight. Get regular exercise Get regular exercise. This is one of the most important things you can do for your health. Most adults should: Exercise for at least 150 minutes each week. The exercise should increase your heart rate and make you sweat (moderate-intensity exercise). Do strengthening exercises at least twice a week. This is in addition to the moderate-intensity exercise. Spend less time sitting. Even light physical activity can be beneficial. Watch cholesterol and blood lipids Have your blood tested for lipids and cholesterol at 72 years of age, then have this test every 5 years. Have your cholesterol levels checked more often if: Your lipid or cholesterol levels are high. You are older than 72 years of age. You are at high risk for heart disease. What should I know about cancer screening? Depending on your health history and family history, you may need to have cancer screening at various ages. This may include screening for: Breast cancer. Cervical cancer. Colorectal cancer. Skin cancer. Lung cancer. What should I know about heart disease, diabetes, and high blood pressure? Blood pressure and heart  disease High blood pressure causes heart disease and increases the risk of stroke. This is more likely to develop in people who have high blood pressure readings or are overweight. Have your blood pressure checked: Every 3-5 years if you are 18-39 years of age. Every year if you are 40 years old or older. Diabetes Have regular diabetes screenings. This checks your fasting blood sugar level. Have the screening done: Once every three years after age 40 if you are at a normal weight and have a low risk for diabetes. More often and at a younger age if you are overweight or have a high risk for diabetes. What should I know about preventing infection? Hepatitis B If you have a higher risk for hepatitis B, you should be screened for this virus. Talk with your health care provider to find out if you are at risk for hepatitis B infection. Hepatitis C Testing is recommended for: Everyone born from 1945 through 1965. Anyone with known risk factors for hepatitis C. Sexually transmitted infections (STIs) Get screened for STIs, including gonorrhea and chlamydia, if: You are sexually active and are younger than 72 years of age. You are older than 72 years of age and your health care provider tells you that you are at risk for this type of infection. Your sexual activity has changed since you were last screened, and you are at increased risk for chlamydia or gonorrhea. Ask your health care provider if you are at risk. Ask your health care provider about whether you are at high risk for HIV. Your health care provider may recommend a prescription medicine to help prevent HIV   infection. If you choose to take medicine to prevent HIV, you should first get tested for HIV. You should then be tested every 3 months for as long as you are taking the medicine. Pregnancy If you are about to stop having your period (premenopausal) and you may become pregnant, seek counseling before you get pregnant. Take 400 to 800  micrograms (mcg) of folic acid every day if you become pregnant. Ask for birth control (contraception) if you want to prevent pregnancy. Osteoporosis and menopause Osteoporosis is a disease in which the bones lose minerals and strength with aging. This can result in bone fractures. If you are 11 years old or older, or if you are at risk for osteoporosis and fractures, ask your health care provider if you should: Be screened for bone loss. Take a calcium or vitamin D supplement to lower your risk of fractures. Be given hormone replacement therapy (HRT) to treat symptoms of menopause. Follow these instructions at home: Alcohol use Do not drink alcohol if: Your health care provider tells you not to drink. You are pregnant, may be pregnant, or are planning to become pregnant. If you drink alcohol: Limit how much you have to: 0-1 drink a day. Know how much alcohol is in your drink. In the [place] one drink equals one 12 oz bottle of beer (355 mL), one 5 oz glass of wine (148 mL), or one 1 oz glass of hard liquor (44 mL). Lifestyle Do not use any products that contain nicotine or tobacco. These products include cigarettes, chewing tobacco, and vaping devices, such as e-cigarettes. If you need help quitting, ask your health care provider. Do not use street drugs. Do not share needles. Ask your health care provider for help if you need support or information about quitting drugs. General instructions Schedule regular health, dental, and eye exams. Stay current with your vaccines. Tell your health care provider if: You often feel depressed. You have ever been abused or do not feel safe at home. Summary Adopting a healthy lifestyle and getting preventive care are important in promoting health and wellness. Follow your health care provider's instructions about healthy diet, exercising, and getting tested or screened for diseases. Follow your health care provider's instructions on monitoring your  cholesterol and blood pressure. This information is not intended to replace advice given to you by your health care provider. Make sure you discuss any questions you have with your health care provider. Document Revised: 04/29/2021 Document Reviewed: 04/29/2021 Elsevier Patient Education  [place].   Skin Abscess  A skin abscess is an infected area on or under your skin that contains a collection of pus and other material. An abscess may also be called a furuncle, carbuncle, or boil. An abscess can occur in or on almost any part of your body. Some abscesses break open (rupture) on their own. Most continue to get worse unless they are treated. The infection can spread deeper into the body and eventually into your blood, which can make you feel ill. Treatment usually involves draining the abscess. What are the causes? An abscess occurs when germs, like bacteria, pass through your skin and cause an infection. This may be caused by: A scrape or cut on your skin. A puncture wound through your skin, including a needle injection or insect bite. Blocked oil or sweat glands. Blocked and infected hair follicles. A cyst that forms beneath your skin (sebaceous cyst) and becomes infected. What increases the risk? This condition is more likely to develop  in people who: Have a weak body defense system (immune system). Have diabetes. Have dry and irritated skin. Get frequent injections or use illegal IV drugs. Have a foreign body in a wound, such as a splinter. Have problems with their lymph system or veins. What are the signs or symptoms? Symptoms of this condition include: A painful, firm bump under the skin. A bump with pus at the top. This may break through the skin and drain. Other symptoms include: Redness surrounding the abscess site. Warmth. Swelling of the lymph nodes (glands) near the abscess. Tenderness. A sore on the skin. How is this diagnosed? This condition may be  diagnosed based on: A physical exam. Your medical history. A sample of pus. This may be used to find out what is causing the infection. Blood tests. Imaging tests, such as an ultrasound, CT scan, or MRI. How is this treated? A small abscess that drains on its own may not need treatment. Treatment for larger abscesses may include: Moist heat or heat pack applied to the area several times a day. A procedure to drain the abscess (incision and drainage). Antibiotic medicines. For a severe abscess, you may first get antibiotics through an IV and then change to antibiotics by mouth. Follow these instructions at home: Medicines  Take over-the-counter and prescription medicines only as told by your health care provider. If you were prescribed an antibiotic medicine, take it as told by your health care provider. Do not stop taking the antibiotic even if you start to feel better. Abscess care  If you have an abscess that has not drained, apply heat to the affected area. Use the heat source that your health care provider recommends, such as a moist heat pack or a heating pad. Place a towel between your skin and the heat source. Leave the heat on for 20-30 minutes. Remove the heat if your skin turns bright red. This is especially important if you are unable to feel pain, heat, or cold. You may have a greater risk of getting burned. Follow instructions from your health care provider about how to take care of your abscess. Make sure you: Cover the abscess with a bandage (dressing). Change your dressing or gauze as told by your health care provider. Wash your hands with soap and water before you change the dressing or gauze. If soap and water are not available, use hand sanitizer. Check your abscess every day for signs of a worsening infection. Check for: More redness, swelling, or pain. More fluid or blood. Warmth. More pus or a bad smell. General instructions To avoid spreading the infection: Do  not share personal care items, towels, or hot tubs with others. Avoid making skin contact with other people. Keep all follow-up visits as told by your health care provider. This is important. Contact a health care provider if you have: More redness, swelling, or pain around your abscess. More fluid or blood coming from your abscess. Warm skin around your abscess. More pus or a bad smell coming from your abscess. Muscle aches. Chills or a general ill feeling. Get help right away if you: Have severe pain. See red streaks on your skin spreading away from the abscess. See redness that spreads quickly. Have a fever or chills. Summary A skin abscess is an infected area on or under your skin that contains a collection of pus and other material. A small abscess that drains on its own may not need treatment. Treatment for larger abscesses may include having a procedure  to drain the abscess and taking an antibiotic. This information is not intended to replace advice given to you by your health care provider. Make sure you discuss any questions you have with your health care provider. Document Revised: 03/13/2022 Document Reviewed: 09/16/2021 Elsevier Patient Education  [place].

## 2023-02-04 LAB — CBC WITH DIFFERENTIAL/PLATELET
Basophils Absolute: 0.1 10*3/uL (ref 0.0–0.2)
Basos: 1 %
EOS (ABSOLUTE): 0.2 10*3/uL (ref 0.0–0.4)
Eos: 3 %
Hematocrit: 38.5 % (ref 34.0–46.6)
Hemoglobin: 12.4 g/dL (ref 11.1–15.9)
Immature Grans (Abs): 0 10*3/uL (ref 0.0–0.1)
Immature Granulocytes: 0 %
Lymphocytes Absolute: 1.8 10*3/uL (ref 0.7–3.1)
Lymphs: 30 %
MCH: 28.1 pg (ref 26.6–33.0)
MCHC: 32.2 g/dL (ref 31.5–35.7)
MCV: 87 fL (ref 79–97)
Monocytes Absolute: 0.4 10*3/uL (ref 0.1–0.9)
Monocytes: 8 %
Neutrophils Absolute: 3.4 10*3/uL (ref 1.4–7.0)
Neutrophils: 58 %
Platelets: 248 10*3/uL (ref 150–450)
RBC: 4.41 x10E6/uL (ref 3.77–5.28)
RDW: 11.6 % — ABNORMAL LOW (ref 11.7–15.4)
WBC: 5.9 10*3/uL (ref 3.4–10.8)

## 2023-02-04 LAB — LIPID PANEL
Chol/HDL Ratio: 3.6 ratio (ref 0.0–4.4)
Cholesterol, Total: 200 mg/dL — ABNORMAL HIGH (ref 100–199)
HDL: 56 mg/dL (ref >39)
LDL Chol Calc (NIH): 130 mg/dL — ABNORMAL HIGH (ref 0–99)
Triglycerides: 77 mg/dL (ref 0–149)
VLDL Cholesterol Cal: 14 mg/dL (ref 5–40)

## 2023-02-04 LAB — CMP14+EGFR
ALT: 17 IU/L (ref 0–32)
AST: 15 IU/L (ref 0–40)
Albumin/Globulin Ratio: 1.3 (ref 1.2–2.2)
Albumin: 4.1 g/dL (ref 3.8–4.8)
Alkaline Phosphatase: 90 IU/L (ref 44–121)
BUN/Creatinine Ratio: 16 (ref 12–28)
BUN: 12 mg/dL (ref 8–27)
Bilirubin Total: 0.3 mg/dL (ref 0.0–1.2)
CO2: 23 mmol/L (ref 20–29)
Calcium: 9.3 mg/dL (ref 8.7–10.3)
Chloride: 104 mmol/L (ref 96–106)
Creatinine, Ser: 0.75 mg/dL (ref 0.57–1.00)
Globulin, Total: 3.2 g/dL (ref 1.5–4.5)
Glucose: 184 mg/dL — ABNORMAL HIGH (ref 70–99)
Potassium: 4.4 mmol/L (ref 3.5–5.2)
Sodium: 142 mmol/L (ref 134–144)
Total Protein: 7.3 g/dL (ref 6.0–8.5)
eGFR: 85 mL/min/{1.73_m2} (ref >59)

## 2023-02-04 LAB — MICROALBUMIN / CREATININE URINE RATIO
Creatinine, Urine: 154 mg/dL
Microalb/Creat Ratio: 17 mg/g creat (ref 0–29)
Microalbumin, Urine: 26.3 ug/mL

## 2023-02-04 LAB — TSH: TSH: 2.15 u[IU]/mL (ref 0.450–4.500)

## 2023-02-04 LAB — VITAMIN D 25 HYDROXY (VIT D DEFICIENCY, FRACTURES): Vit D, 25-Hydroxy: 26.3 ng/mL — ABNORMAL LOW (ref 30.0–100.0)

## 2023-02-16 ENCOUNTER — Telehealth: Payer: Self-pay

## 2023-02-16 DIAGNOSIS — E1151 Type 2 diabetes mellitus with diabetic peripheral angiopathy without gangrene: Secondary | ICD-10-CM | POA: Insufficient documentation

## 2023-02-16 NOTE — Telephone Encounter (Signed)
Patient called to state she was on atbx recently and feels she has developed a yeast infection. She has tried OTC treatment and this has helped but symptoms have not resolved, she reports vaginal itching. She would like to know if you could send in rx for her.  Please advise, thank you!

## 2023-02-19 MED ORDER — FLUCONAZOLE 150 MG PO TABS: ORAL_TABLET | ORAL | 1 refills | Status: DC

## 2023-02-19 NOTE — Telephone Encounter (Signed)
Rx sent to pharmacy. Patient aware. 

## 2023-02-19 NOTE — Telephone Encounter (Signed)
Patient called again to follow up on request.

## 2023-02-19 NOTE — Telephone Encounter (Signed)
We can send diflucan 150 mg one tab now repeat in 5 days

## 2023-04-03 ENCOUNTER — Ambulatory Visit: Payer: Medicare Other | Admitting: Internal Medicine

## 2023-04-03 ENCOUNTER — Encounter: Payer: Self-pay | Admitting: Internal Medicine

## 2023-04-03 VITALS — BP 124/62 | HR 74 | Ht 65.0 in | Wt 204.0 lb

## 2023-04-03 DIAGNOSIS — E782 Mixed hyperlipidemia: Secondary | ICD-10-CM

## 2023-04-03 DIAGNOSIS — E1165 Type 2 diabetes mellitus with hyperglycemia: Secondary | ICD-10-CM | POA: Diagnosis not present

## 2023-04-03 DIAGNOSIS — E669 Obesity, unspecified: Secondary | ICD-10-CM

## 2023-04-03 LAB — POCT GLYCOSYLATED HEMOGLOBIN (HGB A1C): Hemoglobin A1C: 8.4 % — AB (ref 4.0–5.6)

## 2023-04-03 MED ORDER — EMPAGLIFLOZIN 10 MG PO TABS: 10.0000 mg | ORAL_TABLET | Freq: Every day | ORAL | 3 refills | Status: DC

## 2023-04-03 MED ORDER — METFORMIN HCL ER 500 MG PO TB24: 500.0000 mg | ORAL_TABLET | Freq: Every day | ORAL | 3 refills | Status: DC

## 2023-04-03 NOTE — Patient Instructions (Addendum)
Please reduce: - Metformin ER 500 mg to 1x a day.  Start: - Jardiance 10 mg before b'fast  Please stop at the lab.  Please return in 4 months with your sugar log.

## 2023-04-03 NOTE — Progress Notes (Signed)
Patient ID: Leah Robinson, female   DOB: March 23, 1951, 72 y.o.   MRN: [id number]   HPI: Leah Robinson is a 72 y.o.-year-old female, initially referred by her PCP,Janece Christell Constant FNP (Dr. Dorothyann Peng - [facility]),  Returning for follow-up for DM2, dx as prediabetes in 2016, then DM2, non-insulin-dependent, uncontrolled, without long term complications. Last visit 8 months ago.  Interim history: No increased urination, blurry vision, nausea, chest pain. She has stomach cramps from Metformin. He continues to exercise consistently at 5:30 in the morning at the [facility], 5-6 times a week. She also occasionally walks in the evenings. Her husband had severe Covid since last OV.  Reviewed HbA1c levels: Lab Results  Component Value Date   HGBA1C 7.8 (A) 08/07/2022   HGBA1C 8.1 (A) 01/31/2022   HGBA1C 8.6 (A) 10/29/2021  09/05/2019: HbA1c calculated from fructosamine: 7.2% 09/22/2018: HbA1c calculated from fructosamine 6.5% 05/18/2018: HbA1c calculated from fructosamine is much better, at 6.5%. 06/30/2017: HbA1c 7.8% Reportedly, prev. in the 6-7% range.  She is on: - Metform ER 500 >> 1000 mg with dinner-started 10/2021 >> 500 mg 2x with L and D >> 1000 mg with dinner >> midday b/c AP In 10/2020, Marcelline Deist was not affordable. We previously tried Metformin ER 1000 mg with dinner-started 08/2019 >> stopped 09/2019 2/2 AP/constipation. She previously tried metformin IR  -started 2016 >> stopped 2/2 diarrhea and abdominal pain.  Pt checks sugars once a day: - am:  160-170 >> 115, 150, 169, 182 >> 121-150, 160 >> after exercise: 140-150 - 2h after b'fast: 140 >> n/c >> 129, 132 >> 97, 143 >> n/c - before lunch: 122-155 >> 140s >> 99 >> 101-135 >> 119-129 - 2h after lunch: 112-142 >> n/c >> 116-136 >> n/c - before dinner: 119-160 >> 94-113 >> 88, 102-129 >> 89-121 - 2h after dinner: 118 >> n/c >> 108-136 >> 103-140 >> 125-135 - bedtime: 120 >> n/c >> 126 >> n/c >> 133 >> n/c - nighttime:  n/c Lowest: 120 >> ... 88 >> 89 Highest: 220 >> .Marland Kitchen. 180 >> 150  Glucometer: Accu-Chek guide   Pt's meals are: - Breakfast: toast, bacon, cereal, sandwich, ham bisquit, boiled egg - Lunch: salad + nuts, [place] sandwich, tuna - Dinner: greens or veggies, chicken/pork/fish - at ~6:30 pm   -+ Mild CKD, last BUN/creatinine:  Lab Results  Component Value Date   BUN 12 02/03/2023   BUN 18 07/22/2022   CREATININE 0.75 02/03/2023   CREATININE 0.92 07/22/2022  06/30/2017: CMP normal, but with a glucose of 168, BUN/Cr 14/0.86 (GFR 81)  12/10/2016: 10/0.80, eGFR 89, Glu 126  -+ HL. Latest lipids: Lab Results  Component Value Date   CHOL 200 (H) 02/03/2023   HDL 56 02/03/2023   LDLCALC 130 (H) 02/03/2023   TRIG 77 02/03/2023   CHOLHDL 3.6 02/03/2023  06/30/2017: 223/102/58/145  12/10/2016: 186/83/51/118 On Lipitor 10 mg daily, prev. Not consistently - restarted once a day since 01/2023.  - last eye exam was on 01/07/2023: No DR reportedly, low grade cataracts. She goes every 3 mo b/c increased IOP.  -+ Occasional numbness and tingling in her feet.  Last foot exam 01/20/2022.  Before the coronavirus pandemic, she was going to the gym: swimming, walking, weights, Silver sneakers class-  5-6x a week.  [facility]  ROS: + See HPI  I reviewed pt's medications, allergies, PMH, social hx, family hx, and changes were documented in the history of present illness. Otherwise, unchanged  from my initial visit note.  Past Medical History:  Diagnosis Date   Arthritis    Diabetes mellitus without complication (HCC)    Past Surgical History:  Procedure Laterality Date   DILATATION & CURRETTAGE/HYSTEROSCOPY WITH RESECTOCOPE N/A 06/21/2015   Procedure: DILATATION & CURETTAGE/HYSTEROSCOPY WITH RESECTOCOPE;  Surgeon: Maxie Better, MD;  Location: [facility];  Service: Gynecology;  Laterality: N/A;   ROBOTIC ASSISTED SALPINGO OOPHERECTOMY Left 06/21/2015   Procedure:  ROBOTIC ASSISTED LEFT SALPINGO OOPHORECTOMY AND RIGHT SALPINGECTOMY With  Pelvic Washings;  Surgeon: Maxie Better, MD;  Location: [facility];  Service: Gynecology;  Laterality: Left;  2 1/2 hrs.   TUBAL LIGATION     Social History   Social History   Marital status: Married    Spouse name: N/A   Number of children: 1   Occupational History   Retired from [employer]   Social History Main Topics   Smoking status: Former Games developer, quit in 1995   Smokeless tobacco: Never Used   Alcohol use No   Drug use: No   Current Outpatient Medications on File Prior to Visit  Medication Sig Dispense Refill   Accu-Chek FastClix Lancets MISC USE TO CHECK BLOOD SUGAR ONCE DAILY 102 each 3   aspirin (ASPIRIN ADULT LOW STRENGTH) 81 MG chewable tablet Chew 1 tablet (81 mg total) by mouth daily. (Patient taking differently: Chew 81 mg by mouth daily. Takes every other day) 30 tablet 1   atorvastatin (LIPITOR) 10 MG tablet Take 1 tablet by mouth daily 90 tablet 3   benzonatate (TESSALON PERLES) 100 MG capsule Take 1 capsule (100 mg total) by mouth 3 (three) times daily as needed for cough. 30 capsule 1   Blood Glucose Monitoring Suppl (ACCU-CHEK GUIDE) w/Device KIT 1 Device by Does not apply route daily. 1 kit 0   cholecalciferol (VITAMIN D) 1000 UNITS tablet Take 1,000 Units by mouth daily.     fluconazole (DIFLUCAN) 150 MG tablet TAKE 1 TABLET BY MOUTH ONCE, REPEAT IN 5 DAYS 2 tablet 1   glucose blood (ACCU-CHEK GUIDE) test strip USE AS DIRECTED BLOOD SUGAR ONCE DAILY 100 strip 1   metFORMIN (GLUCOPHAGE-XR) 500 MG 24 hr tablet Take 2 tablets (1,000 mg total) by mouth daily with supper. 180 tablet 3   Multiple Vitamin (MULTIVITAMIN WITH MINERALS) TABS tablet Take 1 tablet by mouth daily.     No current facility-administered medications on file prior to visit.   No Known Allergies   Pertinent FH: Pt has FH of DM in mother, maternal uncle.  PE: BP 124/62   Pulse 74   Ht  (1.651 m)   Wt 204 lb (92.5  kg)   SpO2 97%   BMI 33.95 kg/m  Wt Readings from Last 3 Encounters:  04/03/23 204 lb (92.5 kg)  02/03/23 204 lb (92.5 kg)  11/12/22 202 lb (91.6 kg)   Constitutional: overweight, in NAD Eyes: no exophthalmos ENT: no masses palpated in neck, no cervical lymphadenopathy Cardiovascular: RRR, No MRG Respiratory: CTA B Musculoskeletal: no deformities Skin:  no rashes Neurological: no tremor with outstretched hands Diabetic Foot Exam - Simple   Simple Foot Form Diabetic Foot exam was performed with the following findings: Yes 04/03/2023 10:21 AM  Visual Inspection No deformities, no ulcerations, no other skin breakdown bilaterally: Yes Sensation Testing Intact to touch and monofilament testing bilaterally: Yes Pulse Check Posterior Tibialis and Dorsalis pulse intact bilaterally: Yes Comments R hallux toenail removed years ago, indurated 2nd R toenail    ASSESSMENT: 1.  DM2, non-insulin-dependent, without long term complications, but with hyperglycemia  2. HL  3.  Obesity class I  PLAN:  1. Patient with previously well-controlled diabetes, on half maximum dose of metformin, which she did not tolerate at higher doses due to abdominal pain and diarrhea.  She tried to come off metformin but HbA1c increased around the time when she also relaxed her diet.  At last visit, HbA1c was lower, at 7.8%.  Sugars were at goal later in the day but they were still higher in the morning and we discussed about moving the entire dose of metformin with dinner to help with blood sugars in the morning. -Of note, we tried to start an SGLT2 inhibitor Marcelline Deist) but this was too expensive.  She refused injectables.   -At today's visit sugars are slightly higher in the morning, after exercise and she is not checking fasting when she wakes up.  I advised her to try to do so.  The rest of the day, sugars are at goal.  I am surprised at the high HbA1c, but we may be missing some of the higher blood sugars.  At  today's visit, I did recommend to check another fructosamine level. -Also, states she has stomach pain from metformin, we will try to reduce the dose.  I also suggested an SGLT2 inhibitor-next time we can try to send Jardiance at the lower dose.  Discussed about benefits and possible side effects.  Advised her to stay well-hydrated while on this. -She mentions dietary indiscretions, which she is trying to eliminate. - I suggested to:  Patient Instructions  Please reduce: - Metformin ER 500 mg to 1x a day.  Start: - Jardiance 10 mg before b'fast  Please stop at the lab.  Please return in 4 months with your sugar log.   - we checked her HbA1c: 8.4% (higher) - advised to check sugars at different times of the day - 1x a day, rotating check times - advised for yearly eye exams >> she is UTD - return to clinic in 4 months   2. HL -Reviewed latest lipid panel from 2 months ago: LDL above target, the rest the fractions at goal: Lab Results  Component Value Date   CHOL 200 (H) 02/03/2023   HDL 56 02/03/2023   LDLCALC 130 (H) 02/03/2023   TRIG 77 02/03/2023   CHOLHDL 3.6 02/03/2023  -On Lipitor 10 mg daily without side effects -she was not taking it consistently before the above results returned, but not taken daily  3.  Obesity class I -She continues to exercise at the gym -However, she has dietary indiscretions, which she is trying to reduce -At last visit, she gained 3 pounds and since then she gained 2 more  Carlus Pavlov, MD PhD [facility] Endocr[facility]

## 2023-04-07 LAB — FRUCTOSAMINE: Fructosamine: 375 umol/L — ABNORMAL HIGH (ref 205–285)

## 2023-06-04 ENCOUNTER — Ambulatory Visit: Payer: Medicare Other | Admitting: Nurse Practitioner

## 2023-06-29 ENCOUNTER — Other Ambulatory Visit: Payer: Self-pay | Admitting: Internal Medicine

## 2023-06-29 DIAGNOSIS — Z1231 Encounter for screening mammogram for malignant neoplasm of breast: Secondary | ICD-10-CM

## 2023-07-28 ENCOUNTER — Ambulatory Visit (INDEPENDENT_AMBULATORY_CARE_PROVIDER_SITE_OTHER): Payer: Medicare Other

## 2023-07-28 ENCOUNTER — Ambulatory Visit: Payer: Medicare Other | Admitting: Podiatry

## 2023-07-28 DIAGNOSIS — M79671 Pain in right foot: Secondary | ICD-10-CM | POA: Diagnosis not present

## 2023-07-28 DIAGNOSIS — M7661 Achilles tendinitis, right leg: Secondary | ICD-10-CM

## 2023-07-28 DIAGNOSIS — M79672 Pain in left foot: Secondary | ICD-10-CM

## 2023-07-28 DIAGNOSIS — M62461 Contracture of muscle, right lower leg: Secondary | ICD-10-CM | POA: Diagnosis not present

## 2023-07-28 DIAGNOSIS — M62462 Contracture of muscle, left lower leg: Secondary | ICD-10-CM | POA: Diagnosis not present

## 2023-07-28 DIAGNOSIS — M7662 Achilles tendinitis, left leg: Secondary | ICD-10-CM

## 2023-07-28 NOTE — Patient Instructions (Signed)

## 2023-07-31 NOTE — Progress Notes (Signed)
  Subjective:  Patient ID: Leah Robinson, female    DOB: 11/07/1951,  MRN: [id number]  Chief Complaint  Patient presents with   Foot Pain    Heel pain in both feet.    72 y.o. female presents with the above complaint. History confirmed with patient.  It has been going on for several weeks  Objective:  Physical Exam: warm, good capillary refill, no trophic changes or ulcerative lesions, normal DP and PT pulses, and normal sensory exam. Left Foot: tenderness at Achilles tendon insertion and gastrocnemius equinus is noted with a positive silverskiold test Right Foot: tenderness at Achilles tendon insertion and gastrocnemius equinus is noted with a positive silverskiold test  No images are attached to the encounter.  Radiographs: Multiple views x-ray of both feet: no fracture, dislocation, swelling or degenerative changes noted, plantar calcaneal spur, posterior calcaneal spur, and Haglund deformity noted Assessment:   1. Heel pain, bilateral   2. Achilles tendinitis of both lower extremities   3. Gastrocnemius equinus of left lower extremity   4. Gastrocnemius equinus of right lower extremity      Plan:  Patient was evaluated and treated and all questions answered.   Discussed the etiology and treatment options for Achilles tendinitis including stretching, formal physical therapy with an eccentric exercises therapy plan, supportive shoegears such as a running shoe or sneaker, heel lifts, topical and oral medications.  We also discussed that I do not routinely perform injections in this area because of the risk of an increased damage or rupture of the tendon.  We also discussed the role of surgical treatment of this for patients who do not improve after exhausting non-surgical treatment options.  -XR reviewed with patient -Educated on stretching and icing of the affected limb. -Heel lift dispensed and she will utilize these for offloading of the tendon --If not improving or  making progress with her home therapy plan she will let me know and we will refer her to physical therapy -She will utilize OTC Aleve for NSAID relief  Return in about 8 weeks (around 09/22/2023) for re-check Achilles tendon.

## 2023-08-10 ENCOUNTER — Ambulatory Visit: Payer: Medicare Other

## 2023-08-11 ENCOUNTER — Ambulatory Visit
Admission: RE | Admit: 2023-08-11 | Discharge: 2023-08-11 | Disposition: A | Discharge status: Home / Self Care | Payer: Medicare Other | Source: Ambulatory Visit | Attending: Internal Medicine | Admitting: Internal Medicine

## 2023-08-11 DIAGNOSIS — Z1231 Encounter for screening mammogram for malignant neoplasm of breast: Secondary | ICD-10-CM

## 2023-08-13 ENCOUNTER — Encounter: Payer: Self-pay | Admitting: Internal Medicine

## 2023-08-13 ENCOUNTER — Ambulatory Visit: Payer: Medicare Other | Admitting: Internal Medicine

## 2023-08-13 VITALS — BP 138/80 | HR 63 | Ht 65.0 in | Wt 200.6 lb

## 2023-08-13 DIAGNOSIS — E669 Obesity, unspecified: Secondary | ICD-10-CM | POA: Diagnosis not present

## 2023-08-13 DIAGNOSIS — E119 Type 2 diabetes mellitus without complications: Secondary | ICD-10-CM

## 2023-08-13 DIAGNOSIS — E1165 Type 2 diabetes mellitus with hyperglycemia: Secondary | ICD-10-CM

## 2023-08-13 DIAGNOSIS — Z7984 Long term (current) use of oral hypoglycemic drugs: Secondary | ICD-10-CM

## 2023-08-13 DIAGNOSIS — E782 Mixed hyperlipidemia: Secondary | ICD-10-CM | POA: Diagnosis not present

## 2023-08-13 LAB — HEMOGLOBIN A1C: Hemoglobin A1C: 7.7

## 2023-08-13 MED ORDER — METFORMIN HCL ER 500 MG PO TB24: 500.0000 mg | ORAL_TABLET | Freq: Two times a day (BID) | ORAL | 3 refills | Status: DC

## 2023-08-13 NOTE — Progress Notes (Signed)
Patient ID: Leah Robinson, female   DOB: Nov 15, 1951, 72 y.o.   MRN: [id number]   HPI: Leah White Riggio is a 72 y.o.-year-old female, initially referred by her PCP,Janece Christell Constant FNP (Dr. Dorothyann Peng - [facility]),  Returning for follow-up for DM2, dx as prediabetes in 2016, then DM2, non-insulin-dependent, uncontrolled, without long term complications. Last visit 4 months ago.  Interim history: No increased urination, blurry vision, nausea, chest pain. She had stomach cramps from Metformin >> we reduced the dose at last visit. He continues to exercise consistently at 5:30 in the morning at the [facility], 5-6 times a week. She also occasionally walks in the evenings. She is having a lot of stress as her husband is sick.  Reviewed HbA1c levels: 04/03/2023: HbA1c calculated from fructosamine is 8.0%, not much different than the directly measured HbA1c. Lab Results  Component Value Date   HGBA1C 8.4 (A) 04/03/2023   HGBA1C 7.8 (A) 08/07/2022   HGBA1C 8.1 (A) 01/31/2022  09/05/2019: HbA1c calculated from fructosamine: 7.2% 09/22/2018: HbA1c calculated from fructosamine 6.5% 05/18/2018: HbA1c calculated from fructosamine is much better, at 6.5%. 06/30/2017: HbA1c 7.8% Reportedly, prev. in the 6-7% range.  She is on: - Metform ER 500 >> 1000 mg with dinner-started 10/2021 >> 500 mg 2x with L and D >> 1000 mg with dinner >> midday b/c AP >> 500 mg daily We tried Jardiance 10 mg daily-did not start in 03/2023 - not covered. In 10/2020, Marcelline Deist was not affordable. We previously tried Metformin ER 1000 mg with dinner-started 08/2019 >> stopped 09/2019 2/2 AP/constipation. She previously tried metformin IR  -started 2016 >> stopped 2/2 diarrhea and abdominal pain.  Pt checks sugars once a day: - am:  160-170 >> 115, 150, 169, 182 >> 121-150, 160 >>  140-150 >> 125-177, 195 - 2h after b'fast: 140 >> n/c >> 129, 132 >> 97, 143 >> n/c >> 156 - before lunch: 122-155 >> 140s >> 99 >> 101-135 >>  119-129 >> 129, 151 - 2h after lunch: 112-142 >> n/c >> 116-136 >> n/c >> 108, 129  - before dinner: 119-160 >> 94-113 >> 88, 102-129 >> 89-121 >> 89-114 - 2h after dinner: 118 >> n/c >> 108-136 >> 103-140 >> 125-135 >> 115-139 - bedtime: 120 >> n/c >> 126 >> n/c >> 133 >> n/c >> 104-121 - nighttime: n/c Lowest: 120 >> ... 88 >> 89 >> 89 Highest: 220 >> .Marland Kitchen. 180 >> 150 >> 195  Glucometer: Accu-Chek guide   Pt's meals are: - Breakfast: toast, bacon, cereal, sandwich, ham bisquit, boiled egg - Lunch: salad + nuts, [place] sandwich, tuna - Dinner: greens or veggies, chicken/pork/fish - at ~6:30 pm   -+ Mild CKD, last BUN/creatinine:  Lab Results  Component Value Date   BUN 12 02/03/2023   BUN 18 07/22/2022   CREATININE 0.75 02/03/2023   CREATININE 0.92 07/22/2022  06/30/2017: CMP normal, but with a glucose of 168, BUN/Cr 14/0.86 (GFR 81)  12/10/2016: 10/0.80, eGFR 89, Glu 126  -+ HL. Latest lipids: Lab Results  Component Value Date   CHOL 200 (H) 02/03/2023   HDL 56 02/03/2023   LDLCALC 130 (H) 02/03/2023   TRIG 77 02/03/2023   CHOLHDL 3.6 02/03/2023  06/30/2017: 223/102/58/145  12/10/2016: 186/83/51/118 On Lipitor 10 mg daily.  - last eye exam was on 01/07/2023: No DR reportedly, low grade cataracts. She goes every 3 mo b/c increased IOP.  -+ Occasional numbness and tingling in her feet.  Last foot exam 07/28/2023 by Dr.  McDonald.  Before the coronavirus pandemic, she was going to the gym: swimming, walking, weights, Silver sneakers class-  5-6x a week.  [facility]  ROS: + See HPI  I reviewed pt's medications, allergies, PMH, social hx, family hx, and changes were documented in the history of present illness. Otherwise, unchanged from my initial visit note.  Past Medical History:  Diagnosis Date   Arthritis    Diabetes mellitus without complication (HCC)    Past Surgical History:  Procedure Laterality Date   DILATATION &  CURRETTAGE/HYSTEROSCOPY WITH RESECTOCOPE N/A 06/21/2015   Procedure: DILATATION & CURETTAGE/HYSTEROSCOPY WITH RESECTOCOPE;  Surgeon: Maxie Better, MD;  Location: [facility];  Service: Gynecology;  Laterality: N/A;   ROBOTIC ASSISTED SALPINGO OOPHERECTOMY Left 06/21/2015   Procedure: ROBOTIC ASSISTED LEFT SALPINGO OOPHORECTOMY AND RIGHT SALPINGECTOMY With  Pelvic Washings;  Surgeon: Maxie Better, MD;  Location: [facility];  Service: Gynecology;  Laterality: Left;  2 1/2 hrs.   TUBAL LIGATION     Social History   Social History   Marital status: Married    Spouse name: N/A   Number of children: 1   Occupational History   Retired from [employer]   Social History Main Topics   Smoking status: Former Games developer, quit in 1995   Smokeless tobacco: Never Used   Alcohol use No   Drug use: No   Current Outpatient Medications on File Prior to Visit  Medication Sig Dispense Refill   Accu-Chek FastClix Lancets MISC USE TO CHECK BLOOD SUGAR ONCE DAILY 102 each 3   aspirin (ASPIRIN ADULT LOW STRENGTH) 81 MG chewable tablet Chew 1 tablet (81 mg total) by mouth daily. (Patient taking differently: Chew 81 mg by mouth daily. Takes every other day) 30 tablet 1   atorvastatin (LIPITOR) 10 MG tablet Take 1 tablet by mouth daily 90 tablet 3   Blood Glucose Monitoring Suppl (ACCU-CHEK GUIDE) w/Device KIT 1 Device by Does not apply route daily. 1 kit 0   cholecalciferol (VITAMIN D) 1000 UNITS tablet Take 1,000 Units by mouth daily.     empagliflozin (JARDIANCE) 10 MG TABS tablet Take 1 tablet (10 mg total) by mouth daily before breakfast. 90 tablet 3   glucose blood (ACCU-CHEK GUIDE) test strip USE AS DIRECTED BLOOD SUGAR ONCE DAILY 100 strip 1   metFORMIN (GLUCOPHAGE-XR) 500 MG 24 hr tablet Take 1 tablet (500 mg total) by mouth daily with supper. 90 tablet 3   Multiple Vitamin (MULTIVITAMIN WITH MINERALS) TABS tablet Take 1 tablet by mouth daily.     No current facility-administered medications on file prior to  visit.   No Known Allergies   Pertinent FH: Pt has FH of DM in mother, maternal uncle.  PE: BP 138/80   Pulse 63   Ht 5\' 5"  (1.651 m)   Wt 200 lb 9.6 oz (91 kg)   SpO2 97%   BMI 33.38 kg/m  Wt Readings from Last 3 Encounters:  08/13/23 200 lb 9.6 oz (91 kg)  04/03/23 204 lb (92.5 kg)  02/03/23 204 lb (92.5 kg)   Constitutional: overweight, in NAD Eyes: no exophthalmos ENT: no masses palpated in neck, no cervical lymphadenopathy Cardiovascular: RRR, No MRG Respiratory: CTA B Musculoskeletal: no deformities Skin:  no rashes Neurological: no tremor with outstretched hands  ASSESSMENT: 1. DM2, non-insulin-dependent, without long term complications, but with hyperglycemia  2. HL  3.  Obesity class I  PLAN:  1. Patient with previously well-controlled diabetes, on metformin only, which she  could not tolerate well due to abdominal pain/diarrhea.  At last visit, we reduced the dose from 1000 mg daily to 500 mg daily and, since sugars are slightly higher in the morning, I also advised her to add Jardiance.  We tried to start Marcelline Deist in the past but she was too expensive and she refused injectables.  Latest HbA1c was higher, at 8.4%.  The HbA1c calculated from fructosamine was also above target, at 8.0%. We discussed at last visit about trying to improve diet. -At today's visit, she mentions that she did start to improve her diet.  However, she did not start Jardiance due to price and also as she was afraid of the yeast infection.  She also had a lot of stress with her husband being sick and being in the hospital and then in rehab.  He will soon come home. -Sugars appear to be still above target in the first half of the day but they improved later in the day.  We discussed about possibly adding back Jardiance but she would like to hold off until next visit.  She is open to try again to increase the metformin dose and I advised her to take this with meals.  Continue to work on the diet in  the meantime.  She does continue to exercise in the morning. - I suggested to:  Patient Instructions  Please try to increase: - Metformin ER 500 mg 2x a day  Please return in 3 months with your sugar log.   - we checked her HbA1c: 7.7% - lower - advised to check sugars at different times of the day - 1x a day, rotating check times - advised for yearly eye exams >> she is UTD - return to clinic in 3 months   2. HL -Reviewed latest lipid panel from 01/2023: LDL above target, otherwise fractions at goal: Lab Results  Component Value Date   CHOL 200 (H) 02/03/2023   HDL 56 02/03/2023   LDLCALC 130 (H) 02/03/2023   TRIG 77 02/03/2023   CHOLHDL 3.6 02/03/2023  -On Lipitor 10 mg daily without side effects.    3.  Obesity class I -She continues exercising at the gym -She gained 5 pounds before the last 2 visits combined.  At last visit, she was trying to reduce dietary indiscretions. -She lost 4 pounds since then.   Carlus Pavlov, MD PhD [facility]

## 2023-08-13 NOTE — Patient Instructions (Addendum)
Please try to increase: - Metformin ER 500 mg 2x a day  Please return in 3 months with your sugar log.

## 2023-08-18 NOTE — Progress Notes (Unsigned)
Leah Robinson, CMA,acting as a Neurosurgeon for Arnette Felts, FNP.,have documented all relevant documentation on the behalf of Arnette Felts, FNP,as directed by  Arnette Felts, FNP while in the presence of Arnette Felts, FNP.  Subjective:  Patient ID: Leah Robinson , female    DOB: [birth year] , 72 y.o.   MRN: [id number]  No chief complaint on file.   HPI  Patient presents today for a DM and Chol follow up, patient reports compliance with medications. Patient denies any chest pain, SOB, or headaches. Patient has no concerns today.    Past Medical History:  Diagnosis Date  . Arthritis   . Diabetes mellitus without complication (HCC)      Family History  Problem Relation Age of Onset  . Breast cancer Neg Hx      Current Outpatient Medications:  .  Accu-Chek FastClix Lancets MISC, USE TO CHECK BLOOD SUGAR ONCE DAILY, Disp: 102 each, Rfl: 3 .  aspirin (ASPIRIN ADULT LOW STRENGTH) 81 MG chewable tablet, Chew 1 tablet (81 mg total) by mouth daily. (Patient taking differently: Chew 81 mg by mouth daily. Takes every other day), Disp: 30 tablet, Rfl: 1 .  atorvastatin (LIPITOR) 10 MG tablet, Take 1 tablet by mouth daily, Disp: 90 tablet, Rfl: 3 .  Blood Glucose Monitoring Suppl (ACCU-CHEK GUIDE) w/Device KIT, 1 Device by Does not apply route daily., Disp: 1 kit, Rfl: 0 .  cholecalciferol (VITAMIN D) 1000 UNITS tablet, Take 1,000 Units by mouth daily., Disp: , Rfl:  .  glucose blood (ACCU-CHEK GUIDE) test strip, USE AS DIRECTED BLOOD SUGAR ONCE DAILY, Disp: 100 strip, Rfl: 1 .  metFORMIN (GLUCOPHAGE-XR) 500 MG 24 hr tablet, Take 1 tablet (500 mg total) by mouth 2 (two) times daily with a meal., Disp: 180 tablet, Rfl: 3 .  Multiple Vitamin (MULTIVITAMIN WITH MINERALS) TABS tablet, Take 1 tablet by mouth daily., Disp: , Rfl:    No Known Allergies   Review of Systems  Constitutional: Negative.   HENT: Negative.    Eyes: Negative.   Respiratory: Negative.    Cardiovascular: Negative.    Gastrointestinal: Negative.     There were no vitals filed for this visit. There is no height or weight on file to calculate BMI.  Wt Readings from Last 3 Encounters:  08/13/23 200 lb 9.6 oz (91 kg)  04/03/23 204 lb (92.5 kg)  02/03/23 204 lb (92.5 kg)    The 10-year ASCVD risk score (Arnett DK, et al., 2019) is: 32.9%   Values used to calculate the score:     Age: 87 years     Sex: Female     Is Non-Hispanic African American: Yes     Diabetic: Yes     Tobacco smoker: No     Systolic Blood Pressure: 138 mmHg     Is BP treated: No     HDL Cholesterol: 56 mg/dL     Total Cholesterol: 200 mg/dL  Objective:  Physical Exam      Assessment And Plan:  Type 2 diabetes mellitus with diabetic peripheral angiopathy without gangrene, without long-term current use of insulin (HCC)  Mixed hyperlipidemia    No follow-ups on file.  Patient was given opportunity to ask questions. Patient verbalized understanding of the plan and was able to repeat key elements of the plan. All questions were answered to their satisfaction.    Jeanell Sparrow, FNP, have reviewed all documentation for this visit. The documentation on 08/18/23 for the exam, diagnosis, procedures, and  orders are all accurate and complete.   IF YOU HAVE BEEN REFERRED TO A SPECIALIST, IT MAY TAKE 1-2 WEEKS TO SCHEDULE/PROCESS THE REFERRAL. IF YOU HAVE NOT HEARD FROM US/SPECIALIST IN TWO WEEKS, PLEASE GIVE [place] A CALL AT [phone].

## 2023-08-19 ENCOUNTER — Encounter: Payer: Self-pay | Admitting: Nurse Practitioner

## 2023-08-19 ENCOUNTER — Ambulatory Visit (INDEPENDENT_AMBULATORY_CARE_PROVIDER_SITE_OTHER): Payer: Medicare Other | Admitting: Nurse Practitioner

## 2023-08-19 VITALS — BP 134/70 | HR 53 | Temp 98.5°F | Ht 65.0 in | Wt 198.6 lb

## 2023-08-19 DIAGNOSIS — I739 Peripheral vascular disease, unspecified: Secondary | ICD-10-CM

## 2023-08-19 DIAGNOSIS — E782 Mixed hyperlipidemia: Secondary | ICD-10-CM | POA: Diagnosis not present

## 2023-08-19 DIAGNOSIS — E1151 Type 2 diabetes mellitus with diabetic peripheral angiopathy without gangrene: Secondary | ICD-10-CM

## 2023-08-19 DIAGNOSIS — Z6833 Body mass index (BMI) 33.0-33.9, adult: Secondary | ICD-10-CM

## 2023-08-19 DIAGNOSIS — E6609 Other obesity due to excess calories: Secondary | ICD-10-CM

## 2023-08-19 DIAGNOSIS — E559 Vitamin D deficiency, unspecified: Secondary | ICD-10-CM | POA: Diagnosis not present

## 2023-08-19 DIAGNOSIS — E785 Hyperlipidemia, unspecified: Secondary | ICD-10-CM | POA: Diagnosis not present

## 2023-08-19 DIAGNOSIS — E66811 Obesity, class 1: Secondary | ICD-10-CM | POA: Insufficient documentation

## 2023-08-19 DIAGNOSIS — Z2821 Immunization not carried out because of patient refusal: Secondary | ICD-10-CM

## 2023-08-19 MED ORDER — ATORVASTATIN CALCIUM 10 MG PO TABS
ORAL_TABLET | ORAL | 3 refills | Status: DC
Start: 2023-08-19 — End: 2024-07-25

## 2023-08-19 NOTE — Assessment & Plan Note (Signed)

## 2023-08-19 NOTE — Assessment & Plan Note (Signed)
Continue statin. 

## 2023-08-19 NOTE — Assessment & Plan Note (Signed)
She is encouraged to strive for BMI less than 30 to decrease cardiac risk. Continue exercising regularly.

## 2023-08-19 NOTE — Assessment & Plan Note (Signed)
Declines shingrix, educated on disease process and is aware if he changes his mind to notify office  

## 2023-08-19 NOTE — Assessment & Plan Note (Signed)
HgbA1c improved, seen by Endocrinology last week and no changes. Continue current medications. Declined taking Jardiance due to side effects

## 2023-08-19 NOTE — Assessment & Plan Note (Signed)
Will check vitamin D level and supplement as needed.    Also encouraged to spend 15 minutes in the sun daily.   

## 2023-08-19 NOTE — Assessment & Plan Note (Signed)
Continue statin, tolerating well. Has been out of her medication for 10 days. Refill sent to pharmacy

## 2023-08-20 LAB — CMP14+EGFR
ALT: 12 IU/L (ref 0–32)
AST: 13 IU/L (ref 0–40)
Albumin: 4.1 g/dL (ref 3.8–4.8)
Alkaline Phosphatase: 70 IU/L (ref 44–121)
BUN/Creatinine Ratio: 14 (ref 12–28)
BUN: 10 mg/dL (ref 8–27)
Bilirubin Total: 0.4 mg/dL (ref 0.0–1.2)
CO2: 22 mmol/L (ref 20–29)
Calcium: 9.3 mg/dL (ref 8.7–10.3)
Chloride: 105 mmol/L (ref 96–106)
Creatinine, Ser: 0.72 mg/dL (ref 0.57–1.00)
Globulin, Total: 2.8 g/dL (ref 1.5–4.5)
Glucose: 128 mg/dL — ABNORMAL HIGH (ref 70–99)
Potassium: 4.2 mmol/L (ref 3.5–5.2)
Sodium: 143 mmol/L (ref 134–144)
Total Protein: 6.9 g/dL (ref 6.0–8.5)
eGFR: 89 mL/min/{1.73_m2} (ref >59)

## 2023-08-20 LAB — LIPID PANEL
Chol/HDL Ratio: 3.2 ratio (ref 0.0–4.4)
Cholesterol, Total: 180 mg/dL (ref 100–199)
HDL: 57 mg/dL (ref >39)
LDL Chol Calc (NIH): 113 mg/dL — ABNORMAL HIGH (ref 0–99)
Triglycerides: 53 mg/dL (ref 0–149)
VLDL Cholesterol Cal: 10 mg/dL (ref 5–40)

## 2023-08-20 LAB — VITAMIN D 25 HYDROXY (VIT D DEFICIENCY, FRACTURES): Vit D, 25-Hydroxy: 33.2 ng/mL (ref 30.0–100.0)

## 2023-08-26 ENCOUNTER — Encounter: Payer: Self-pay | Admitting: Internal Medicine

## 2023-09-22 ENCOUNTER — Ambulatory Visit: Payer: Medicare Other | Admitting: Podiatry

## 2023-10-27 ENCOUNTER — Ambulatory Visit: Payer: Medicare Other | Admitting: Podiatry

## 2023-11-13 ENCOUNTER — Ambulatory Visit: Payer: Medicare Other | Admitting: Internal Medicine

## 2023-12-09 ENCOUNTER — Ambulatory Visit (INDEPENDENT_AMBULATORY_CARE_PROVIDER_SITE_OTHER): Payer: Medicare Other

## 2023-12-09 ENCOUNTER — Ambulatory Visit: Payer: Self-pay | Admitting: Nurse Practitioner

## 2023-12-09 DIAGNOSIS — Z0000 Encounter for general adult medical examination without abnormal findings: Secondary | ICD-10-CM

## 2023-12-09 NOTE — Progress Notes (Signed)
Subjective:   Leah Robinson is a 72 y.o. female who presents for Medicare Annual (Subsequent) preventive examination.  Visit Complete: Virtual I connected with  Leah Robinson on 12/09/23 by a audio enabled telemedicine application and verified that I am speaking with the correct person using two identifiers.  Patient Location: Home  Provider Location: Office/Clinic  I discussed the limitations of evaluation and management by telemedicine. The patient expressed understanding and agreed to proceed.  Vital Signs: Because this visit was a virtual/telehealth visit, some criteria may be missing or patient reported. Any vitals not documented were not able to be obtained and vitals that have been documented are patient reported.    Cardiac Risk Factors include: advanced age (>75men, >53 women);diabetes mellitus;dyslipidemia     Objective:    Today's Vitals   There is no height or weight on file to calculate BMI.     12/09/2023    9:05 AM 11/12/2022    1:05 PM 10/23/2021    8:31 AM 10/10/2020    8:49 AM 01/10/2020   10:20 AM 08/31/2019    9:25 AM 06/21/2015   10:32 AM  Advanced Directives  Does Patient Have a Medical Advance Directive? Yes Yes Yes Yes Yes Yes No  Type of Estate agent of [place];Living will Healthcare Power of [place];Living will Healthcare Power of [place];Living will Healthcare Power of [employer];Living will Healthcare Power of [employer] of [place];Living will   Does patient want to make changes to medical advance directive?     No - Patient declined    Copy of Healthcare Power of Attorney in Chart? No - copy requested No - copy requested No - copy requested No - copy requested  No - copy requested     Current Medications (verified) Outpatient Encounter Medications as of 12/09/2023  Medication Sig   Accu-Chek FastClix Lancets MISC USE TO CHECK BLOOD SUGAR ONCE DAILY   aspirin (ASPIRIN ADULT LOW STRENGTH) 81 MG  chewable tablet Chew 1 tablet (81 mg total) by mouth daily. (Patient taking differently: Chew 81 mg by mouth daily. Takes every other day)   atorvastatin (LIPITOR) 10 MG tablet Take 1 tablet by mouth daily   Blood Glucose Monitoring Suppl (ACCU-CHEK GUIDE) w/Device KIT 1 Device by Does not apply route daily.   cholecalciferol (VITAMIN D) 1000 UNITS tablet Take 1,000 Units by mouth daily.   glucose blood (ACCU-CHEK GUIDE) test strip USE AS DIRECTED BLOOD SUGAR ONCE DAILY   metFORMIN (GLUCOPHAGE-XR) 500 MG 24 hr tablet Take 1 tablet (500 mg total) by mouth 2 (two) times daily with a meal.   Multiple Vitamin (MULTIVITAMIN WITH MINERALS) TABS tablet Take 1 tablet by mouth daily.   No facility-administered encounter medications on file as of 12/09/2023.    Allergies (verified) Patient has no known allergies.   History: Past Medical History:  Diagnosis Date   Arthritis    Diabetes mellitus without complication (HCC)    LLQ abdominal pain 02/08/2020   Past Surgical History:  Procedure Laterality Date   DILATATION & CURRETTAGE/HYSTEROSCOPY WITH RESECTOCOPE N/A 06/21/2015   Procedure: DILATATION & CURETTAGE/HYSTEROSCOPY WITH RESECTOCOPE;  Surgeon: Maxie Better, MD;  Location: [facility];  Service: Gynecology;  Laterality: N/A;   ROBOTIC ASSISTED SALPINGO OOPHERECTOMY Left 06/21/2015   Procedure: ROBOTIC ASSISTED LEFT SALPINGO OOPHORECTOMY AND RIGHT SALPINGECTOMY With  Pelvic Washings;  Surgeon: Maxie Better, MD;  Location: [facility];  Service: Gynecology;  Laterality: Left;  2 1/2 hrs.   TUBAL LIGATION  Family History  Problem Relation Age of Onset   Breast cancer Neg Hx    Social History   Socioeconomic History   Marital status: Married    Spouse name: Not on file   Number of children: Not on file   Years of education: Not on file   Highest education level: Not on file  Occupational History   Occupation: retired   Occupation: part time work  Tobacco Use   Smoking status:  Former   Smokeless tobacco: Never  Advertising account planner   Vaping status: Never Used  Substance and Sexual Activity   Alcohol use: No   Drug use: No   Sexual activity: Not Currently  Other Topics Concern   Not on file  Social History Narrative   Not on file   Social Drivers of Health   Financial Resource Strain: Low Risk  (12/09/2023)   Overall Financial Resource Strain (CARDIA)    Difficulty of Paying Living Expenses: Not hard at all  Food Insecurity: No Food Insecurity (12/09/2023)   Hunger Vital Sign    Worried About Running Out of Food in the Last Year: Never true    Ran Out of Food in the Last Year: Never true  Transportation Needs: No Transportation Needs (12/09/2023)   PRAPARE - Administrator, Civil Service (Medical): No    Lack of Transportation (Non-Medical): No  Physical Activity: Inactive (12/09/2023)   Exercise Vital Sign    Days of Exercise per Week: 0 days    Minutes of Exercise per Session: 0 min  Stress: No Stress Concern Present (12/09/2023)   [employer] of Occupational Health - Occupational Stress Questionnaire    Feeling of Stress : Not at all  Social Connections: Moderately Integrated (12/09/2023)   Social Connection and Isolation Panel [NHANES]    Frequency of Communication with Friends and Family: More than three times a week    Frequency of Social Gatherings with Friends and Family: Not on file    Attends Religious Services: More than 4 times per year    Active Member of [employer] or Organizations: No    Attends Engineer, structural: Never    Marital Status: Married    Tobacco Counseling Counseling given: Not Answered   Clinical Intake:  Pre-visit preparation completed: Yes  Pain : No/denies pain     Nutritional Risks: None Diabetes: Yes CBG done?: No Did pt. bring in CBG monitor from home?: No  How often do you need to have someone help you when you read instructions, pamphlets, or other written materials from your  doctor or pharmacy?: 1 - Never  Interpreter Needed?: No  Information entered by :: NAllen LPN   Activities of Daily Living    12/09/2023    9:01 AM  In your present state of health, do you have any difficulty performing the following activities:  Hearing? 0  Vision? 0  Difficulty concentrating or making decisions? 0  Walking or climbing stairs? 0  Dressing or bathing? 0  Doing errands, shopping? 0  Preparing Food and eating ? N  Using the Toilet? N  In the past six months, have you accidently leaked urine? N  Do you have problems with loss of bowel control? N  Managing your Medications? N  Managing your Finances? N  Housekeeping or managing your Housekeeping? N    Patient Care Team: Arnette Felts, FNP as PCP - General (General Practice)  Indicate any recent Medical Services you may have received from other than  [facility] providers in the past year (date may be approximate).     Assessment:   This is a routine wellness examination for Leah.  Hearing/Vision screen Hearing Screening - Comments:: Denies hearing issues Vision Screening - Comments:: Regular eye exams, [facility]   Goals Addressed             This Visit's Progress    Patient Stated       12/09/2023, wants to start exercising again turn of the year       Depression Screen    12/09/2023    9:07 AM 02/03/2023    8:54 AM 11/12/2022    1:07 PM 10/23/2021    8:32 AM 10/10/2020    8:52 AM 02/27/2020    8:39 AM 01/18/2020   12:01 PM  PHQ 2/9 Scores  PHQ - 2 Score 0 0 0 0 0 0 0  PHQ- 9 Score 0          Fall Risk    12/09/2023    9:06 AM 08/19/2023    8:23 AM 02/03/2023    8:54 AM 11/12/2022    1:06 PM 10/23/2021    8:32 AM  Fall Risk   Falls in the past year? 0 0 0 0 0  Number falls in past yr: 0 0  0   Injury with Fall? 0 0  0   Risk for fall due to : Medication side effect No Fall Risks  Medication side effect No Fall Risks  Follow up Falls prevention discussed;Falls evaluation completed  Falls evaluation completed  Falls prevention discussed;Education provided;Falls evaluation completed Falls evaluation completed;Education provided;Falls prevention discussed    MEDICARE RISK AT HOME: Medicare Risk at Home Any stairs in or around the home?: Yes (has a ramp) If so, are there any without handrails?: No Home free of loose throw rugs in walkways, pet beds, electrical cords, etc?: Yes Adequate lighting in your home to reduce risk of falls?: Yes Life alert?: No Use of a cane, walker or w/c?: No Grab bars in the bathroom?: Yes Shower chair or bench in shower?: Yes Elevated toilet seat or a handicapped toilet?: Yes  TIMED UP AND GO:  Was the test performed?  No    Cognitive Function:        12/09/2023    9:08 AM 11/12/2022    1:08 PM 10/23/2021    8:34 AM 10/10/2020    8:53 AM 08/31/2019    9:27 AM  6CIT Screen  What Year? 0 points 0 points 0 points 0 points 0 points  What month? 0 points 0 points 0 points 0 points 0 points  What time? 0 points 0 points 0 points 0 points 0 points  Count back from 20 0 points 0 points 0 points 0 points 0 points  Months in reverse 0 points 0 points 2 points 0 points 0 points  Repeat phrase 0 points 2 points 0 points 0 points 0 points  Total Score 0 points 2 points 2 points 0 points 0 points    Immunizations Immunization History  Administered Date(s) Administered   Fluad Quad(high Dose 65+) 10/10/2020   Influenza, High Dose Seasonal PF 09/22/2018, 08/31/2019   PFIZER(Purple Top)SARS-COV-2 Vaccination 01/27/2020, 02/17/2020, 09/28/2020, 04/24/2021   Pfizer Covid-19 Vaccine Bivalent Booster 66yrs & up 09/25/2021   Tdap 08/30/2018   Unspecified SARS-COV-2 Vaccination 10/15/2022    TDAP status: Up to date  Flu Vaccine status: Declined, Education has been provided regarding the importance of this vaccine but  patient still declined. Advised may receive this vaccine at local pharmacy or Health Dept. Aware to provide a copy of the  vaccination record if obtained from local pharmacy or Health Dept. Verbalized acceptance and understanding.  Pneumococcal vaccine status: Declined,  Education has been provided regarding the importance of this vaccine but patient still declined. Advised may receive this vaccine at local pharmacy or Health Dept. Aware to provide a copy of the vaccination record if obtained from local pharmacy or Health Dept. Verbalized acceptance and understanding.   Covid-19 vaccine status: Completed vaccines  Qualifies for Shingles Vaccine? Yes   Zostavax completed No   Shingrix Completed?: No.    Education has been provided regarding the importance of this vaccine. Patient has been advised to call insurance company to determine out of pocket expense if they have not yet received this vaccine. Advised may also receive vaccine at local pharmacy or Health Dept. Verbalized acceptance and understanding.  Screening Tests Health Maintenance  Topic Date Due   COVID-19 Vaccine (7 - 2024-25 season) 08/23/2023   HEMOGLOBIN A1C  10/03/2023   Zoster Vaccines- Shingrix (1 of 2) 03/08/2024 (Originally 02/26/1970)   INFLUENZA VACCINE  03/21/2024 (Originally 07/23/2023)   Pneumonia Vaccine 76+ Years old (1 of 2 - PCV) 12/08/2024 (Originally 02/26/1957)   OPHTHALMOLOGY EXAM  01/08/2024   Diabetic kidney evaluation - Urine ACR  02/04/2024   FOOT EXAM  07/27/2024   Diabetic kidney evaluation - eGFR measurement  08/18/2024   Medicare Annual Wellness (AWV)  12/08/2024   MAMMOGRAM  08/10/2025   DTaP/Tdap/Td (2 - Td or Tdap) 08/30/2028   Colonoscopy  05/24/2031   DEXA SCAN  Completed   Hepatitis C Screening  Completed   HPV VACCINES  Aged Out    Health Maintenance  Health Maintenance Due  Topic Date Due   COVID-19 Vaccine (7 - 2024-25 season) 08/23/2023   HEMOGLOBIN A1C  10/03/2023    Colorectal cancer screening: No longer required.   Mammogram status: Completed 08/11/2023. Repeat every year  Bone Density status:  Completed 10/29/2018.   Lung Cancer Screening: (Low Dose CT Chest recommended if Age 57-80 years, 20 pack-year currently smoking OR have quit w/in 15years.) does not qualify.   Lung Cancer Screening Referral: no  Additional Screening:  Hepatitis C Screening: does qualify; Completed 08/31/2019  Vision Screening: Recommended annual ophthalmology exams for early detection of glaucoma and other disorders of the eye. Is the patient up to date with their annual eye exam?  Yes  Who is the provider or what is the name of the office in which the patient attends annual eye exams? [facility] If pt is not established with a provider, would they like to be referred to a provider to establish care? No .   Dental Screening: Recommended annual dental exams for proper oral hygiene  Diabetic Foot Exam: Diabetic Foot Exam: Completed 07/28/2023  Community Resource Referral / Chronic Care Management: CRR required this visit?  No   CCM required this visit?  No     Plan:     I have personally reviewed and noted the following in the patient's chart:   Medical and social history Use of alcohol, tobacco or illicit drugs  Current medications and supplements including opioid prescriptions. Patient is not currently taking opioid prescriptions. Functional ability and status Nutritional status Physical activity Advanced directives List of other physicians Hospitalizations, surgeries, and ER visits in previous 12 months Vitals Screenings to include cognitive, depression, and falls Referrals and appointments  In  addition, I have reviewed and discussed with patient certain preventive protocols, quality metrics, and best practice recommendations. A written personalized care plan for preventive services as well as general preventive health recommendations were provided to patient.     Barb Merino, LPN   [id number]   After Visit Summary: (MyChart) Due to this being a telephonic visit, the after visit  summary with patients personalized plan was offered to patient via MyChart   Nurse Notes: none

## 2023-12-09 NOTE — Patient Instructions (Addendum)
Leah Robinson , Thank you for taking time to come for your Medicare Wellness Visit. I appreciate your ongoing commitment to your health goals. Please review the following plan we discussed and let me know if I can assist you in the future.   Referrals/Orders/Follow-Ups/Clinician Recommendations: none  This is a list of the screening recommended for you and due dates:  Health Maintenance  Topic Date Due   COVID-19 Vaccine (7 - 2024-25 season) 08/23/2023   Hemoglobin A1C  10/03/2023   Zoster (Shingles) Vaccine (1 of 2) 03/08/2024*   Flu Shot  03/21/2024*   Pneumonia Vaccine (1 of 2 - PCV) 12/08/2024*   Eye exam for diabetics  01/08/2024   Yearly kidney health urinalysis for diabetes  02/04/2024   Complete foot exam   07/27/2024   Yearly kidney function blood test for diabetes  08/18/2024   Medicare Annual Wellness Visit  12/08/2024   Mammogram  08/10/2025   DTaP/Tdap/Td vaccine (2 - Td or Tdap) 08/30/2028   Colon Cancer Screening  05/24/2031   DEXA scan (bone density measurement)  Completed   Hepatitis C Screening  Completed   HPV Vaccine  Aged Out  *Topic was postponed. The date shown is not the original due date.    Advanced directives: (Copy Requested) Please bring a copy of your health care power of attorney and living will to the office to be added to your chart at your convenience.  Next Medicare Annual Wellness Visit scheduled for next year: Yes  Insert Preventive Care attachment Insert FALL PREVENTION attachment if needed

## 2024-01-01 ENCOUNTER — Ambulatory Visit: Payer: Medicare Other | Admitting: Internal Medicine

## 2024-01-13 LAB — HM DIABETES EYE EXAM

## 2024-01-19 ENCOUNTER — Emergency Department (HOSPITAL_BASED_OUTPATIENT_CLINIC_OR_DEPARTMENT_OTHER)
Admission: EM | Admit: 2024-01-19 | Discharge: 2024-01-19 | Disposition: A | Discharge status: Home / Self Care | Payer: Medicare Other | Attending: Emergency Medicine | Admitting: Emergency Medicine

## 2024-01-19 ENCOUNTER — Emergency Department (HOSPITAL_BASED_OUTPATIENT_CLINIC_OR_DEPARTMENT_OTHER): Payer: Medicare Other | Admitting: Radiology

## 2024-01-19 ENCOUNTER — Other Ambulatory Visit: Payer: Self-pay

## 2024-01-19 ENCOUNTER — Encounter (HOSPITAL_BASED_OUTPATIENT_CLINIC_OR_DEPARTMENT_OTHER): Payer: Self-pay

## 2024-01-19 DIAGNOSIS — X500XXA Overexertion from strenuous movement or load, initial encounter: Secondary | ICD-10-CM | POA: Insufficient documentation

## 2024-01-19 DIAGNOSIS — M545 Low back pain, unspecified: Secondary | ICD-10-CM | POA: Insufficient documentation

## 2024-01-19 DIAGNOSIS — M25552 Pain in left hip: Secondary | ICD-10-CM | POA: Diagnosis not present

## 2024-01-19 DIAGNOSIS — R1032 Left lower quadrant pain: Secondary | ICD-10-CM | POA: Diagnosis present

## 2024-01-19 NOTE — ED Triage Notes (Addendum)
In for eval of left groin pain sec to lifting her husband yesterday. He is a total care patient. Feels like she pulled something. No pain while at rest, pain with walking/movement.

## 2024-01-19 NOTE — ED Provider Notes (Signed)
[place] EMERGENCY DEPARTMENT AT [facility] Provider Note   CSN: [id number] Arrival date & time: 01/19/24  1500     History Chief Complaint  Patient presents with   Groin Pain    HPI Leah Robinson Leah Robinson is a 73 y.o. female presenting for left groin pain. No pain at rest but substantial  States that she had to lift her husband from a near fall.     Patient's recorded medical, surgical, social, medication list and allergies were reviewed in the Snapshot window as part of the initial history.   Review of Systems   Review of Systems  Constitutional:  Negative for chills and fever.  HENT:  Negative for ear pain and sore throat.   Eyes:  Negative for pain and visual disturbance.  Respiratory:  Negative for cough and shortness of breath.   Cardiovascular:  Negative for chest pain and palpitations.  Gastrointestinal:  Negative for abdominal pain and vomiting.  Genitourinary:  Negative for dysuria and hematuria.  Musculoskeletal:  Negative for arthralgias and back pain.  Skin:  Negative for color change and rash.  Neurological:  Negative for seizures and syncope.  All other systems reviewed and are negative.   Physical Exam Updated Vital Signs BP (!) 176/80 (BP Location: Right Arm)   Pulse 73   Temp 98.6 F (37 C)   Resp 17   Ht 5\' 4"  (1.626 m)   Wt 92.5 kg   SpO2 95%   BMI 35.02 kg/m  Physical Exam Vitals and nursing note reviewed.  Constitutional:      General: She is not in acute distress.    Appearance: She is well-developed.  HENT:     Head: Normocephalic and atraumatic.  Eyes:     Conjunctiva/sclera: Conjunctivae normal.  Cardiovascular:     Rate and Rhythm: Normal rate and regular rhythm.     Heart sounds: No murmur heard. Pulmonary:     Effort: Pulmonary effort is normal. No respiratory distress.     Breath sounds: Normal breath sounds.  Abdominal:     General: There is no distension.     Palpations: Abdomen is soft.     Tenderness:  There is no abdominal tenderness. There is no right CVA tenderness or left CVA tenderness.  Musculoskeletal:        General: No swelling or tenderness. Normal range of motion.     Cervical back: Neck supple.  Skin:    General: Skin is warm and dry.  Neurological:     General: No focal deficit present.     Mental Status: She is alert and oriented to person, place, and time. Mental status is at baseline.     Cranial Nerves: No cranial nerve deficit.      ED Course/ Medical Decision Making/ A&P    Procedures Procedures   Medications Ordered in ED Medications - No data to display  Medical Decision Making:   73 year old female presenting with a chief complaint of left hip pain.  States that she has to take care of her husband who is a full assist for all ADLs.  She had to lift him up from a near fall position yesterday and she is having pain in left lower back into her left hip.  Denies fevers chills nausea vomiting syncope shortness of breath.  Is otherwise ambulatory.  No medication prior to arrival.  History of arthritis. No dysuria or other symptoms. Discussed supportive care likely sacroiliitis based on findings on x-ray.  No evidence of  orthopedic injury or fracture.  Patient stable for outpatient care management follow-up with PCP.  Disposition:  I have considered need for hospitalization, however, considering all of the above, I believe this patient is stable for discharge at this time.  Patient/family educated about specific return precautions for given chief complaint and symptoms.  Patient/family educated about follow-up with PCP.     Patient/family expressed understanding of return precautions and need for follow-up. Patient spoken to regarding all imaging and laboratory results and appropriate follow up for these results. All education provided in verbal form with additional information in written form. Time was allowed for answering of patient questions. Patient discharged.     Emergency Department Medication Summary:   Medications - No data to display      Clinical Impression:  1. Pain of left hip      Discharge   Final Clinical Impression(s) / ED Diagnoses Final diagnoses:  Pain of left hip    Rx / DC Orders ED Discharge Orders     None         Glyn Ade, MD 01/19/24 [phone]

## 2024-01-21 ENCOUNTER — Ambulatory Visit: Payer: Medicare Other | Admitting: Internal Medicine

## 2024-01-21 ENCOUNTER — Encounter: Payer: Self-pay | Admitting: Internal Medicine

## 2024-01-21 VITALS — BP 130/70 | HR 54 | Ht 64.0 in | Wt 205.6 lb

## 2024-01-21 DIAGNOSIS — Z7984 Long term (current) use of oral hypoglycemic drugs: Secondary | ICD-10-CM | POA: Diagnosis not present

## 2024-01-21 DIAGNOSIS — E782 Mixed hyperlipidemia: Secondary | ICD-10-CM

## 2024-01-21 DIAGNOSIS — E66812 Obesity, class 2: Secondary | ICD-10-CM

## 2024-01-21 DIAGNOSIS — E1165 Type 2 diabetes mellitus with hyperglycemia: Secondary | ICD-10-CM | POA: Diagnosis not present

## 2024-01-21 LAB — POCT GLYCOSYLATED HEMOGLOBIN (HGB A1C): Hemoglobin A1C: 9.1 % — AB (ref 4.0–5.6)

## 2024-01-21 MED ORDER — METFORMIN HCL ER 500 MG PO TB24: ORAL_TABLET | ORAL | Status: AC

## 2024-01-21 MED ORDER — GLIPIZIDE ER 5 MG PO TB24: 5.0000 mg | ORAL_TABLET | Freq: Every day | ORAL | 3 refills | Status: DC

## 2024-01-21 MED ORDER — ACCU-CHEK GUIDE TEST VI STRP: ORAL_STRIP | 12 refills | Status: AC

## 2024-01-21 NOTE — Patient Instructions (Addendum)
Please continue: - Metformin ER 500 mg daily  Try to start: - Glipizide ER 5 mg before b'fast  Please return in 1.5  months with your sugar log.

## 2024-01-21 NOTE — Progress Notes (Signed)
Patient ID: Leah Robinson, female   DOB: October 15, 1951, 73 y.o.   MRN: [id number]   HPI: Leah White Senske is a 73 y.o.-year-old female, initially referred by her PCP,Janece Christell Constant FNP (Dr. Dorothyann Peng - [facility]),  Returning for follow-up for DM2, dx as prediabetes in 2016, then DM2, non-insulin-dependent, uncontrolled, without long term complications. Last visit 4 months ago.  Interim history: No increased urination, blurry vision, nausea, chest pain.  She was exercising consistently at 5:30 in the morning at the [facility], 5-6 times a week + walking. She just restarted to go to the gym. She is having a lot of stress at home, as her husband is sick and he needs help 24/7.  Reviewed HbA1c levels: 08/13/2023: HbA1c 7.7% 04/03/2023: HbA1c calculated from fructosamine is 8.0%, not much different than the directly measured HbA1c. Lab Results  Component Value Date   HGBA1C 8.4 (A) 04/03/2023   HGBA1C 7.8 (A) 08/07/2022   HGBA1C 8.1 (A) 01/31/2022  09/05/2019: HbA1c calculated from fructosamine: 7.2% 09/22/2018: HbA1c calculated from fructosamine 6.5% 05/18/2018: HbA1c calculated from fructosamine is much better, at 6.5%. 06/30/2017: HbA1c 7.8% Reportedly, prev. in the 6-7% range.  She is on: - Metform ER 500 >> 1000 mg with dinner...>> 500 mg daily >> 2x a day >> 1x a day (b/c diarrhea) We tried Jardiance 10 mg daily-did not start in 03/2023 - not covered. In 10/2020, Marcelline Deist was not affordable. We previously tried Metformin ER 1000 mg with dinner-started 08/2019 >> stopped 09/2019 2/2 AP/constipation. She previously tried metformin IR  -started 2016 >> stopped 2/2 diarrhea and abdominal pain.  Pt checks sugars once a day: - am:  121-150, 160 >>  140-150 >> 125-177, 195 >> 131, 136 - 2h after b'fast: 129, 132 >> 97, 143 >> n/c >> 156 >> n/c - before lunch:  101-135 >> 119-129 >> 129, 151>> 98-149 - 2h after lunch: n/c >> 116-136 >> n/c >> 108, 129 >> n/c - before dinner:  88, 102-129  >> 89-121 >> 89-114 >> 100, 137 - 2h after dinner: 103-140 >> 125-135 >> 115-139 >> 129, 164 - bedtime: 126 >> n/c >> 133 >> n/c >> 104-121 >> n/c - nighttime: n/c Lowest: 88 >> 89 >> 89 >> 92 Highest: 220 >> ... 195 >> 164  Glucometer: Accu-Chek guide   Pt's meals are: - Breakfast: toast, bacon, cereal, sandwich, ham bisquit, boiled egg - Lunch: salad + nuts, [place] sandwich, tuna - Dinner: greens or veggies, chicken/pork/fish - at ~6:30 pm   -+ Mild CKD, last BUN/creatinine:  Lab Results  Component Value Date   BUN 10 08/19/2023   BUN 12 02/03/2023   CREATININE 0.72 08/19/2023   CREATININE 0.75 02/03/2023   Lab Results  Component Value Date   MICRALBCREAT 17 02/03/2023   MICRALBCREAT 9 01/20/2022   MICRALBCREAT 30-300 04/10/2021   MICRALBCREAT 30 02/27/2020   MICRALBCREAT <30 02/18/2019   -+ HL. Latest lipids: Lab Results  Component Value Date   CHOL 180 08/19/2023   HDL 57 08/19/2023   LDLCALC 113 (H) 08/19/2023   TRIG 53 08/19/2023   CHOLHDL 3.2 08/19/2023  On Lipitor 10 mg daily.  - last eye exam was on 01/13/2024: No DR, low grade cataracts. She goes every 3 mo b/c increased IOP.  -+ Occasional numbness and tingling in her feet.  Last foot exam 07/28/2023 by Dr. Lilian Kapur.  Before the coronavirus pandemic, she was going to the gym: swimming, walking, weights, Silver sneakers class-  5-6x a week.  [facility]  ROS: + See HPI  I reviewed pt's medications, allergies, PMH, social hx, family hx, and changes were documented in the history of present illness. Otherwise, unchanged from my initial visit note.  Past Medical History:  Diagnosis Date   Arthritis    Diabetes mellitus without complication (HCC)    LLQ abdominal pain 02/08/2020   Past Surgical History:  Procedure Laterality Date   DILATATION & CURRETTAGE/HYSTEROSCOPY WITH RESECTOCOPE N/A 06/21/2015   Procedure: DILATATION & CURETTAGE/HYSTEROSCOPY WITH RESECTOCOPE;  Surgeon:  Maxie Better, MD;  Location: [facility];  Service: Gynecology;  Laterality: N/A;   ROBOTIC ASSISTED SALPINGO OOPHERECTOMY Left 06/21/2015   Procedure: ROBOTIC ASSISTED LEFT SALPINGO OOPHORECTOMY AND RIGHT SALPINGECTOMY With  Pelvic Washings;  Surgeon: Maxie Better, MD;  Location: [facility];  Service: Gynecology;  Laterality: Left;  2 1/2 hrs.   TUBAL LIGATION     Social History   Social History   Marital status: Married    Spouse name: N/A   Number of children: 1   Occupational History   Retired from [employer]   Social History Main Topics   Smoking status: Former Games developer, quit in 1995   Smokeless tobacco: Never Used   Alcohol use No   Drug use: No   Current Outpatient Medications on File Prior to Visit  Medication Sig Dispense Refill   Accu-Chek FastClix Lancets MISC USE TO CHECK BLOOD SUGAR ONCE DAILY 102 each 3   aspirin (ASPIRIN ADULT LOW STRENGTH) 81 MG chewable tablet Chew 1 tablet (81 mg total) by mouth daily. (Patient taking differently: Chew 81 mg by mouth daily. Takes every other day) 30 tablet 1   atorvastatin (LIPITOR) 10 MG tablet Take 1 tablet by mouth daily 90 tablet 3   Blood Glucose Monitoring Suppl (ACCU-CHEK GUIDE) w/Device KIT 1 Device by Does not apply route daily. 1 kit 0   cholecalciferol (VITAMIN D) 1000 UNITS tablet Take 1,000 Units by mouth daily.     glucose blood (ACCU-CHEK GUIDE) test strip USE AS DIRECTED BLOOD SUGAR ONCE DAILY 100 strip 1   metFORMIN (GLUCOPHAGE-XR) 500 MG 24 hr tablet Take 1 tablet (500 mg total) by mouth 2 (two) times daily with a meal. 180 tablet 3   Multiple Vitamin (MULTIVITAMIN WITH MINERALS) TABS tablet Take 1 tablet by mouth daily.     No current facility-administered medications on file prior to visit.   No Known Allergies   Pertinent FH: Pt has FH of DM in mother, maternal uncle.  PE: BP 130/70   Pulse (!) 54   Ht 5\' 4"  (1.626 m)   Wt 205 lb 9.6 oz (93.3 kg)   SpO2 97%   BMI 35.29 kg/m  Wt Readings from Last 3  Encounters:  01/21/24 205 lb 9.6 oz (93.3 kg)  01/19/24 204 lb (92.5 kg)  08/19/23 198 lb 9.6 oz (90.1 kg)   Constitutional: overweight, in NAD Eyes: no exophthalmos ENT: no masses palpated in neck, no cervical lymphadenopathy Cardiovascular: RRR, No MRG Respiratory: CTA B Musculoskeletal: no deformities Skin:  no rashes Neurological: no tremor with outstretched hands  ASSESSMENT: 1. DM2, non-insulin-dependent, without long term complications, but with hyperglycemia  2. HL  3.  Obesity class II  PLAN:  1. Patient with previously well-controlled type 2 diabetes, on metformin only, at a low dose due to abdominal pain/diarrhea.  At last visit, HbA1c was improved, but still above target, at 7.7%, decreased from 8.4%.  She started to improve diet before last  visit but did not start SGLT2 inhibitor due to price and also as she was afraid of yeast infections.  Sugars were still above target in the first half of the day and improving later in the day.  We discussed about possibly adding back Jardiance but she wanted to hold off at that time.  She was open to try to increase metformin dose while continuing to work on her diet. -At today's visit, sugars are not much change from before, however, the A1c is much higher.  I suspect that her test trips are called since the last refill was sent exactly 2 years ago.  I advised her to refill the test strips and also to check more, since she only has 3 blood sugar reads in the last 3 months.  We discussed at today's visit that judging from the HbA1c, she will need intensification of treatment.  She declines starting basal insulin and also starting an SGLT2 inhibitor as she mentions that these are not affordable.  We discussed about the possibility of patient assistance program but she declines [place] or [place].  In that case, I suggested glipizide extended release low-dose.  She agrees to try this.  Will continue metformin for now.  She can only take it  once a day due to diarrhea.  She is willing to try to improve diet and get committed to going to the gym 3-4 times a week. - I suggested to:  Patient Instructions  Please continue: - Metformin ER 500 mg daily  Try to start: - Glipizide ER 5 mg before b'fast  Please return in 1.5  months with your sugar log.   - we checked her HbA1c: 9.1% (higher) - advised to check sugars at different times of the day - 1x a day, rotating check times - advised for yearly eye exams >> she is UTD - return to clinic in 1.5 months   2. HL -Latest lipid panel was reviewed from 07/2023: LDL above target, otherwise fractions at goal: Lab Results  Component Value Date   CHOL 180 08/19/2023   HDL 57 08/19/2023   LDLCALC 113 (H) 08/19/2023   TRIG 53 08/19/2023   CHOLHDL 3.2 08/19/2023  -She continues Lipitor 10 mg daily without side effects  3.  Obesity class II -She continues exercising at the gym -She lost 4 pounds before last visit -She gained 7 pounds since last visit  Carlus Pavlov, MD PhD [facility]

## 2024-01-22 ENCOUNTER — Encounter: Payer: Self-pay | Admitting: Internal Medicine

## 2024-01-22 LAB — MICROALBUMIN / CREATININE URINE RATIO
Creatinine, Urine: 121 mg/dL (ref 20–275)
Microalb Creat Ratio: 15 mg/g{creat} (ref <30)
Microalb, Ur: 1.8 mg/dL

## 2024-02-08 ENCOUNTER — Ambulatory Visit (INDEPENDENT_AMBULATORY_CARE_PROVIDER_SITE_OTHER): Payer: Medicare Other | Admitting: Nurse Practitioner

## 2024-02-08 ENCOUNTER — Encounter: Payer: Self-pay | Admitting: Nurse Practitioner

## 2024-02-08 VITALS — BP 120/80 | HR 64 | Temp 98.5°F | Ht 64.0 in | Wt 204.4 lb

## 2024-02-08 DIAGNOSIS — E559 Vitamin D deficiency, unspecified: Secondary | ICD-10-CM

## 2024-02-08 DIAGNOSIS — E1151 Type 2 diabetes mellitus with diabetic peripheral angiopathy without gangrene: Secondary | ICD-10-CM

## 2024-02-08 DIAGNOSIS — R1902 Left upper quadrant abdominal swelling, mass and lump: Secondary | ICD-10-CM | POA: Insufficient documentation

## 2024-02-08 DIAGNOSIS — E782 Mixed hyperlipidemia: Secondary | ICD-10-CM | POA: Diagnosis not present

## 2024-02-08 DIAGNOSIS — I739 Peripheral vascular disease, unspecified: Secondary | ICD-10-CM | POA: Diagnosis not present

## 2024-02-08 DIAGNOSIS — Z6835 Body mass index (BMI) 35.0-35.9, adult: Secondary | ICD-10-CM | POA: Insufficient documentation

## 2024-02-08 DIAGNOSIS — Z0000 Encounter for general adult medical examination without abnormal findings: Secondary | ICD-10-CM | POA: Insufficient documentation

## 2024-02-08 DIAGNOSIS — Z79899 Other long term (current) drug therapy: Secondary | ICD-10-CM

## 2024-02-08 LAB — POCT URINALYSIS DIP (CLINITEK)
Bilirubin, UA: NEGATIVE
Blood, UA: NEGATIVE
Glucose, UA: NEGATIVE mg/dL
Ketones, POC UA: NEGATIVE mg/dL
Leukocytes, UA: NEGATIVE
Nitrite, UA: NEGATIVE
POC PROTEIN,UA: 30 — AB
Spec Grav, UA: >=1.03 — AB (ref 1.010–1.025)
Urobilinogen, UA: 0.2 U/dL
pH, UA: 5 (ref 5.0–8.0)

## 2024-02-08 NOTE — Assessment & Plan Note (Signed)
Palpable mass to left upper abdomen, will check ultrasound.

## 2024-02-08 NOTE — Assessment & Plan Note (Signed)
 Will check vitamin D level and supplement as needed.    Also encouraged to spend 15 minutes in the sun daily.

## 2024-02-08 NOTE — Assessment & Plan Note (Signed)
Cholesterol is stable but LDL is not at goal of less than 70, Continue statin, tolerating well.

## 2024-02-08 NOTE — Assessment & Plan Note (Signed)
 She is encouraged to strive for BMI less than 30 to decrease cardiac risk. Advised to aim for at least 150 minutes of exercise per week.

## 2024-02-08 NOTE — Progress Notes (Signed)
Madelaine Bhat, CMA,acting as a Neurosurgeon for Arnette Felts, FNP.,have documented all relevant documentation on the behalf of Arnette Felts, FNP,as directed by  Arnette Felts, FNP while in the presence of Arnette Felts, FNP.  Subjective:    Patient ID: Leah Robinson , female    DOB: [birth year] , 73 y.o.   MRN: [id number]  Chief Complaint  Patient presents with   Annual Exam    HPI  Patient presents today for HM, Patient reports compliance with medication. Patient denies any chest pain, SOB, or headaches. Patient reports she was recently diagnosed with arthritis, she was directed to take tylenol as needed. Continues to see Endocrinology - Dr. Lafe Garin, her A1c has increased. She has been caring for her husband who has been sick. She added on glipizide and her f/u is in April. She was also seen at [facility] ER due to thinking she pulled a muscle to her groin.      Past Medical History:  Diagnosis Date   Arthritis    Diabetes mellitus without complication (HCC)    LLQ abdominal pain 02/08/2020     Family History  Problem Relation Age of Onset   Breast cancer Neg Hx      Current Outpatient Medications:    Accu-Chek FastClix Lancets MISC, USE TO CHECK BLOOD SUGAR ONCE DAILY, Disp: 102 each, Rfl: 3   atorvastatin (LIPITOR) 10 MG tablet, Take 1 tablet by mouth daily, Disp: 90 tablet, Rfl: 3   Blood Glucose Monitoring Suppl (ACCU-CHEK GUIDE) w/Device KIT, 1 Device by Does not apply route daily., Disp: 1 kit, Rfl: 0   cholecalciferol (VITAMIN D) 1000 UNITS tablet, Take 1,000 Units by mouth daily., Disp: , Rfl:    glipiZIDE (GLUCOTROL XL) 5 MG 24 hr tablet, Take 1 tablet (5 mg total) by mouth daily with breakfast., Disp: 90 tablet, Rfl: 3   glucose blood (ACCU-CHEK GUIDE TEST) test strip, Use as instructed 1-2x a day, Disp: 100 each, Rfl: 12   metFORMIN (GLUCOPHAGE-XR) 500 MG 24 hr tablet, Take 1-2 tablets by mouth with b'fast, Disp: , Rfl:    Multiple Vitamin (MULTIVITAMIN WITH MINERALS)  TABS tablet, Take 1 tablet by mouth daily., Disp: , Rfl:    aspirin (ASPIRIN ADULT LOW STRENGTH) 81 MG chewable tablet, Chew 1 tablet (81 mg total) by mouth daily. (Patient not taking: Reported on 01/21/2024), Disp: 30 tablet, Rfl: 1   No Known Allergies    The patient states she uses post menopausal status for birth control. No LMP recorded. Patient is postmenopausal.  Negative for: breast discharge, breast lump(s), breast pain and breast self exam. Associated symptoms include abnormal vaginal bleeding. Pertinent negatives include abnormal bleeding (hematology), anxiety, decreased libido, depression, difficulty falling sleep, dyspareunia, history of infertility, nocturia, sexual dysfunction, sleep disturbances, urinary incontinence, urinary urgency, vaginal discharge and vaginal itching. Diet regular; she is cutting back on the starches and carbs.  The patient states her exercise level is minimal - she is back at the gym since August. Her husband is in short term rehab at this time.   The patient's tobacco use is:  Social History   Tobacco Use  Smoking Status Former  Smokeless Tobacco Never  She has been exposed to passive smoke. The patient's alcohol use is:  Social History   Substance and Sexual Activity  Alcohol Use No    Review of Systems  Constitutional: Negative.   Cardiovascular: Negative.   Genitourinary:  Negative for vaginal pain.  Musculoskeletal: Negative.   Neurological: Negative.  Psychiatric/Behavioral: Negative.       Today's Vitals   02/08/24 0833  BP: 120/80  Pulse: 64  Temp: 98.5 F (36.9 C)  TempSrc: Oral  Weight: 204 lb 6.4 oz (92.7 kg)  Height: 5\' 4"  (1.626 m)  PainSc: 0-No pain   Body mass index is 35.09 kg/m.  Wt Readings from Last 3 Encounters:  02/08/24 204 lb 6.4 oz (92.7 kg)  01/21/24 205 lb 9.6 oz (93.3 kg)  01/19/24 204 lb (92.5 kg)     Objective:  Physical Exam Vitals reviewed.  Constitutional:      General: She is not in acute  distress.    Appearance: Normal appearance. She is well-developed. She is obese.  HENT:     Head: Normocephalic and atraumatic.     Right Ear: Hearing, tympanic membrane, ear canal and external ear normal. There is no impacted cerumen.     Left Ear: Hearing, tympanic membrane, ear canal and external ear normal. There is no impacted cerumen.     Nose: Nose normal.     Mouth/Throat:     Mouth: Mucous membranes are moist.  Eyes:     General: Lids are normal.     Extraocular Movements: Extraocular movements intact.     Conjunctiva/sclera: Conjunctivae normal.     Pupils: Pupils are equal, round, and reactive to light.     Funduscopic exam:    Right eye: No papilledema.        Left eye: No papilledema.  Neck:     Thyroid: No thyroid mass.     Vascular: No carotid bruit.  Cardiovascular:     Rate and Rhythm: Normal rate and regular rhythm.     Pulses: Normal pulses.     Heart sounds: Normal heart sounds. No murmur heard. Pulmonary:     Effort: Pulmonary effort is normal. No respiratory distress.     Breath sounds: Normal breath sounds. No wheezing.  Chest:     Chest wall: No mass.  Breasts:    Tanner Score is 5.     Right: Normal. No mass or tenderness.     Left: Normal. No mass or tenderness.  Abdominal:     General: Abdomen is flat. Bowel sounds are normal. There is no distension.     Palpations: Abdomen is soft.     Tenderness: There is no abdominal tenderness.  Genitourinary:    Rectum: Guaiac result negative.  Musculoskeletal:        General: No swelling or tenderness. Normal range of motion.     Cervical back: Full passive range of motion without pain, normal range of motion and neck supple.     Right lower leg: No edema.     Left lower leg: No edema.  Lymphadenopathy:     Upper Body:     Right upper body: No supraclavicular, axillary or pectoral adenopathy.     Left upper body: No supraclavicular, axillary or pectoral adenopathy.  Skin:    General: Skin is warm and  dry.     Capillary Refill: Capillary refill takes less than 2 seconds.  Neurological:     General: No focal deficit present.     Mental Status: She is alert and oriented to person, place, and time.     Cranial Nerves: No cranial nerve deficit.     Sensory: No sensory deficit.     Motor: No weakness.  Psychiatric:        Mood and Affect: Mood normal.  Behavior: Behavior normal.        Thought Content: Thought content normal.        Judgment: Judgment normal.         Assessment And Plan:     Encounter for annual health examination  Type 2 diabetes mellitus with diabetic peripheral angiopathy without gangrene, without long-term current use of insulin (HCC) Assessment & Plan: HgbA1c worsened, A1c was done at Endocrinology and she was given glipizide in addition to her metformin. EKG done with Sinus Bradycardia HR 53  Orders: -     EKG 12-Lead -     POCT URINALYSIS DIP (CLINITEK) -     Microalbumin / creatinine urine ratio -     CMP14+EGFR  Mixed hyperlipidemia Assessment & Plan: Cholesterol is stable but LDL is not at goal of less than 70, Continue statin, tolerating well.   Orders: -     CMP14+EGFR -     Lipid panel  Vitamin D deficiency Assessment & Plan: Will check vitamin D level and supplement as needed.    Also encouraged to spend 15 minutes in the sun daily.    Orders: -     VITAMIN D 25 Hydroxy (Vit-D Deficiency, Fractures)  PAD (peripheral artery disease) (HCC) Assessment & Plan: Continue statin.    BMI 35.0-35.9,adult Assessment & Plan: She is encouraged to strive for BMI less than 30 to decrease cardiac risk. Advised to aim for at least 150 minutes of exercise per week.    Other long term (current) drug therapy -     CBC with Differential/Platelet  Obesity, morbid (HCC)  Left upper quadrant abdominal mass Assessment & Plan: Palpable mass to left upper abdomen, will check ultrasound.   Orders: -     US Abdomen Complete;  Future   Return for 1 year physical, controlled DM check 6 months.  Patient was given opportunity to ask questions. Patient verbalized understanding of the plan and was able to repeat key elements of the plan. All questions were answered to their satisfaction.   Arnette Felts, FNP  I, Arnette Felts, FNP, have reviewed all documentation for this visit. The documentation on 02/08/24 for the exam, diagnosis, procedures, and orders are all accurate and complete.

## 2024-02-08 NOTE — Patient Instructions (Signed)
Health Maintenance  Topic Date Due   COVID-19 Vaccine (8 - 2024-25 season) 02/24/2024*   Zoster (Shingles) Vaccine (1 of 2) 03/08/2024*   Flu Shot  03/21/2024*   Pneumonia Vaccine (1 of 2 - PCV) 12/08/2024*   Hemoglobin A1C  07/20/2024   Complete foot exam   07/27/2024   Yearly kidney function blood test for diabetes  08/18/2024   Medicare Annual Wellness Visit  12/08/2024   Eye exam for diabetics  01/12/2025   Yearly kidney health urinalysis for diabetes  01/20/2025   Mammogram  08/10/2025   DTaP/Tdap/Td vaccine (2 - Td or Tdap) 08/30/2028   Colon Cancer Screening  05/24/2031   DEXA scan (bone density measurement)  Completed   Hepatitis C Screening  Completed   HPV Vaccine  Aged Out  *Topic was postponed. The date shown is not the original due date.

## 2024-02-08 NOTE — Assessment & Plan Note (Signed)
 Continue statin.

## 2024-02-08 NOTE — Assessment & Plan Note (Addendum)
HgbA1c worsened, A1c was done at Endocrinology and she was given glipizide in addition to her metformin. EKG done with Sinus Bradycardia HR 53

## 2024-02-09 ENCOUNTER — Encounter: Payer: Self-pay | Admitting: Nurse Practitioner

## 2024-02-09 LAB — CBC WITH DIFFERENTIAL/PLATELET
Basophils Absolute: 0 10*3/uL (ref 0.0–0.2)
Basos: 1 %
EOS (ABSOLUTE): 0.2 10*3/uL (ref 0.0–0.4)
Eos: 3 %
Hematocrit: 37.6 % (ref 34.0–46.6)
Hemoglobin: 12.1 g/dL (ref 11.1–15.9)
Immature Grans (Abs): 0 10*3/uL (ref 0.0–0.1)
Immature Granulocytes: 0 %
Lymphocytes Absolute: 1.7 10*3/uL (ref 0.7–3.1)
Lymphs: 28 %
MCH: 28.6 pg (ref 26.6–33.0)
MCHC: 32.2 g/dL (ref 31.5–35.7)
MCV: 89 fL (ref 79–97)
Monocytes Absolute: 0.4 10*3/uL (ref 0.1–0.9)
Monocytes: 8 %
Neutrophils Absolute: 3.6 10*3/uL (ref 1.4–7.0)
Neutrophils: 60 %
Platelets: 213 10*3/uL (ref 150–450)
RBC: 4.23 x10E6/uL (ref 3.77–5.28)
RDW: 11.8 % (ref 11.7–15.4)
WBC: 5.9 10*3/uL (ref 3.4–10.8)

## 2024-02-09 LAB — CMP14+EGFR
ALT: 17 [IU]/L (ref 0–32)
AST: 13 [IU]/L (ref 0–40)
Albumin: 4.3 g/dL (ref 3.8–4.8)
Alkaline Phosphatase: 73 [IU]/L (ref 44–121)
BUN/Creatinine Ratio: 21 (ref 12–28)
BUN: 18 mg/dL (ref 8–27)
Bilirubin Total: 0.4 mg/dL (ref 0.0–1.2)
CO2: 24 mmol/L (ref 20–29)
Calcium: 9.7 mg/dL (ref 8.7–10.3)
Chloride: 103 mmol/L (ref 96–106)
Creatinine, Ser: 0.84 mg/dL (ref 0.57–1.00)
Globulin, Total: 3.1 g/dL (ref 1.5–4.5)
Glucose: 206 mg/dL — ABNORMAL HIGH (ref 70–99)
Potassium: 4.8 mmol/L (ref 3.5–5.2)
Sodium: 140 mmol/L (ref 134–144)
Total Protein: 7.4 g/dL (ref 6.0–8.5)
eGFR: 74 mL/min/{1.73_m2} (ref >59)

## 2024-02-09 LAB — LIPID PANEL
Chol/HDL Ratio: 2.7 {ratio} (ref 0.0–4.4)
Cholesterol, Total: 153 mg/dL (ref 100–199)
HDL: 56 mg/dL (ref >39)
LDL Chol Calc (NIH): 86 mg/dL (ref 0–99)
Triglycerides: 55 mg/dL (ref 0–149)
VLDL Cholesterol Cal: 11 mg/dL (ref 5–40)

## 2024-02-09 LAB — MICROALBUMIN / CREATININE URINE RATIO
Creatinine, Urine: 236.5 mg/dL
Microalb/Creat Ratio: 11 mg/g{creat} (ref 0–29)
Microalbumin, Urine: 26.2 ug/mL

## 2024-02-09 LAB — VITAMIN D 25 HYDROXY (VIT D DEFICIENCY, FRACTURES): Vit D, 25-Hydroxy: 28.1 ng/mL — ABNORMAL LOW (ref 30.0–100.0)

## 2024-02-16 ENCOUNTER — Ambulatory Visit
Admission: RE | Admit: 2024-02-16 | Discharge: 2024-02-16 | Disposition: A | Discharge status: Home / Self Care | Payer: Medicare Other | Source: Ambulatory Visit | Attending: Nurse Practitioner | Admitting: Nurse Practitioner

## 2024-02-16 DIAGNOSIS — R1902 Left upper quadrant abdominal swelling, mass and lump: Secondary | ICD-10-CM

## 2024-02-17 ENCOUNTER — Encounter: Payer: Self-pay | Admitting: Nurse Practitioner

## 2024-03-25 ENCOUNTER — Encounter: Payer: Self-pay | Admitting: Internal Medicine

## 2024-03-25 ENCOUNTER — Ambulatory Visit: Payer: Medicare Other | Admitting: Internal Medicine

## 2024-03-25 VITALS — BP 122/70 | HR 67 | Ht 64.0 in | Wt 198.2 lb

## 2024-03-25 DIAGNOSIS — Z7984 Long term (current) use of oral hypoglycemic drugs: Secondary | ICD-10-CM | POA: Diagnosis not present

## 2024-03-25 DIAGNOSIS — E782 Mixed hyperlipidemia: Secondary | ICD-10-CM

## 2024-03-25 DIAGNOSIS — E66812 Obesity, class 2: Secondary | ICD-10-CM | POA: Diagnosis not present

## 2024-03-25 DIAGNOSIS — E1165 Type 2 diabetes mellitus with hyperglycemia: Secondary | ICD-10-CM | POA: Diagnosis not present

## 2024-03-25 LAB — POCT GLYCOSYLATED HEMOGLOBIN (HGB A1C): Hemoglobin A1C: 7.7 % — AB (ref 4.0–5.6)

## 2024-03-25 NOTE — Patient Instructions (Addendum)
 Please continue: - Metformin ER [phone] mg daily - Glipizide ER 5 mg before b'fast  Please return in 3-4 months with your sugar log.

## 2024-03-25 NOTE — Progress Notes (Signed)
 Patient ID: Leah Robinson, female   DOB: 1951/08/25, 73 y.o.   MRN: [id number]   HPI: Leah White Wander is a 73 y.o.-year-old female, initially referred by her PCP,Leah Robinson (Dr. Dorothyann Robinson - [facility]),  Returning for follow-up for DM2, dx as prediabetes in 2016, then DM2, non-insulin-dependent, uncontrolled, without long term complications. Last visit 3 months ago.  Her husband, who was sick for many years, just passed away.  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  Interim history: No increased urination, blurry vision, nausea, chest pain.  She just restarted to go to the [facility] before last visit.  She continues this but not very frequently >> will increase the frequency. She cut down on bread and sweets.  Reviewed HbA1c levels: Lab Results  Component Value Date   HGBA1C 9.1 (A) 01/21/2024   HGBA1C 7.7 08/13/2023   HGBA1C 8.4 (A) 04/03/2023  08/13/2023: HbA1c 7.7% 04/03/2023: HbA1c calculated from fructosamine is 8.0%, not much different than the directly measured HbA1c. 09/05/2019: HbA1c calculated from fructosamine: 7.2% 09/22/2018: HbA1c calculated from fructosamine 6.5% 05/18/2018: HbA1c calculated from fructosamine is much better, at 6.5%. 06/30/2017: HbA1c 7.8% Reportedly, prev. in the 6-7% range.  She is on: - Metform ER 500 >> 1000 mg with dinner...>> 500 mg daily >> 2x a day >> 1x a day (b/c diarrhea) We tried Jardiance 10 mg daily-did not start in 03/2023 - not covered. In 10/2020, Leah Robinson was not affordable. We previously tried Metformin ER 1000 mg with dinner-started 08/2019 >> stopped 09/2019 2/2 AP/constipation. She previously tried metformin IR  -started 2016 >> stopped 2/2 diarrhea and abdominal pain.  Pt checks sugars once a day: - am:  121-150, 160 >>  140-150 >> 125-177, 195 >> 131, 136 >> 141-167, 224 - 2h after b'fast: 129, 132 >> 97, 143 >> n/c >> 156 >> n/c >> 131 - before lunch:  101-135 >> 119-129 >> 129, 151>> 98-149>> 88-165 - 2h  after lunch: n/c >> 116-136 >> n/c >> 108, 129 >> n/c >> 100-131  - before dinner:  88, 102-129 >> 89-121 >> 89-114 >> 100, 137 >> 85-109 - 2h after dinner: 103-140 >> 125-135 >> 115-139 >> 129, 164 >> 92, 131 - bedtime: 126 >> n/c >> 133 >> n/c >> 104-121 >> n/c - nighttime: n/c Lowest: 88 >> 89 >> 89 >> 92 >> 85 Highest: 220 >> ... 195 >> 164 >> 224  Glucometer: Accu-Chek guide   Pt's meals are: - Breakfast: toast, bacon, cereal, sandwich, ham bisquit, boiled egg - Lunch: salad + nuts, [place] sandwich, tuna - Dinner: greens or veggies, chicken/pork/fish - at ~6:30 pm   -+ Mild CKD, last BUN/creatinine:  Lab Results  Component Value Date   BUN 18 02/08/2024   BUN 10 08/19/2023   CREATININE 0.84 02/08/2024   CREATININE 0.72 08/19/2023   Lab Results  Component Value Date   MICRALBCREAT 11 02/08/2024   MICRALBCREAT 15 01/21/2024   MICRALBCREAT 17 02/03/2023   MICRALBCREAT 9 01/20/2022   MICRALBCREAT 30-300 04/10/2021   MICRALBCREAT 30 02/27/2020   MICRALBCREAT <30 02/18/2019   -+ HL. Latest lipids: Lab Results  Component Value Date   CHOL 153 02/08/2024   HDL 56 02/08/2024   LDLCALC 86 02/08/2024   TRIG 55 02/08/2024   CHOLHDL 2.7 02/08/2024  On Lipitor 10 mg daily.  - last eye exam was on 01/13/2024: No DR, low grade cataracts. She goes every 3 mo b/c increased IOP.  -+ Occasional numbness and tingling  in her feet.  Last foot exam 07/28/2023 by Dr. Lilian Robinson.  Before the coronavirus pandemic, she was going to the gym: swimming, walking, weights, Silver sneakers class-  5-6x a week.  [facility]  ROS: + See HPI  I reviewed pt's medications, allergies, PMH, social hx, family hx, and changes were documented in the history of present illness. Otherwise, unchanged from my initial visit note.  Past Medical History:  Diagnosis Date   Arthritis    Diabetes mellitus without complication (HCC)    LLQ abdominal pain 02/08/2020   Past Surgical  History:  Procedure Laterality Date   DILATATION & CURRETTAGE/HYSTEROSCOPY WITH RESECTOCOPE N/A 06/21/2015   Procedure: DILATATION & CURETTAGE/HYSTEROSCOPY WITH RESECTOCOPE;  Surgeon: Maxie Better, MD;  Location: [facility];  Service: Gynecology;  Laterality: N/A;   ROBOTIC ASSISTED SALPINGO OOPHERECTOMY Left 06/21/2015   Procedure: ROBOTIC ASSISTED LEFT SALPINGO OOPHORECTOMY AND RIGHT SALPINGECTOMY With  Pelvic Washings;  Surgeon: Maxie Better, MD;  Location: [facility];  Service: Gynecology;  Laterality: Left;  2 1/2 hrs.   TUBAL LIGATION     Social History   Social History   Marital status: Married    Spouse name: N/A   Number of children: 1   Occupational History   Retired from [employer]   Social History Main Topics   Smoking status: Former Games developer, quit in 1995   Smokeless tobacco: Never Used   Alcohol use No   Drug use: No   Current Outpatient Medications on File Prior to Visit  Medication Sig Dispense Refill   Accu-Chek FastClix Lancets MISC USE TO CHECK BLOOD SUGAR ONCE DAILY 102 each 3   aspirin (ASPIRIN ADULT LOW STRENGTH) 81 MG chewable tablet Chew 1 tablet (81 mg total) by mouth daily. (Patient not taking: Reported on 01/21/2024) 30 tablet 1   atorvastatin (LIPITOR) 10 MG tablet Take 1 tablet by mouth daily 90 tablet 3   Blood Glucose Monitoring Suppl (ACCU-CHEK GUIDE) w/Device KIT 1 Device by Does not apply route daily. 1 kit 0   cholecalciferol (VITAMIN D) 1000 UNITS tablet Take 1,000 Units by mouth daily.     glipiZIDE (GLUCOTROL XL) 5 MG 24 hr tablet Take 1 tablet (5 mg total) by mouth daily with breakfast. 90 tablet 3   glucose blood (ACCU-CHEK GUIDE TEST) test strip Use as instructed 1-2x a day 100 each 12   metFORMIN (GLUCOPHAGE-XR) 500 MG 24 hr tablet Take 1-2 tablets by mouth with b'fast     Multiple Vitamin (MULTIVITAMIN WITH MINERALS) TABS tablet Take 1 tablet by mouth daily.     No current facility-administered medications on file prior to visit.   No Known  Allergies   Pertinent FH: Pt has FH of DM in mother, maternal uncle.  PE: BP 122/70   Pulse 67   Ht 5\' 4"  (1.626 m)   Wt 198 lb 3.2 oz (89.9 kg)   SpO2 97%   BMI 34.02 kg/m  Wt Readings from Last 3 Encounters:  03/25/24 198 lb 3.2 oz (89.9 kg)  02/08/24 204 lb 6.4 oz (92.7 kg)  01/21/24 205 lb 9.6 oz (93.3 kg)   Constitutional: overweight, in NAD Eyes: no exophthalmos ENT: no masses palpated in neck, no cervical lymphadenopathy Cardiovascular: RRR, No MRG Respiratory: CTA B Musculoskeletal: no deformities Skin:  no rashes Neurological: no tremor with outstretched hands  ASSESSMENT: 1. DM2, non-insulin-dependent, without long term complications, but with hyperglycemia  2. HL  3.  Obesity class II  PLAN:  1.  Patient with previously well-controlled type 2 diabetes on metformin low-dose due to abdominal pain and diarrhea but with an increase in HbA1c at last visit, when HbA1c returned 9.1%.  At that time, the actual sugars at home were not much change.  I advised her to change her test strips and to check more frequently as she only had 3 blood sugar reads in the previous 3 months.  Judging from the A1c, she needed intensification of treatment but she declined starting basal insulin or starting an SGLT2 inhibitor as these were not affordable.  We discussed about the possibility of a patient assistance program but she still declined.  In that case, I suggested to add glipizide extended release low-dose.  She agreed with this.  We continued metformin.  I also strongly recommended to improve her diet and continue going to the gym. -At today's visit, sugars are still above target in the morning which she relates to eating dinner late, after staying with her husband at the facility.  However, he passed away 4 days ago.  She feels well, has plenty of support.  She started to eat dinners earlier.  She also plans to go to the gym more frequently now. -She could not tolerate the higher dose  of metformin in the past.  We discussed that she could alternate between 1 and 2 tablets, depending on how she can take them. - I suggested to:  Patient Instructions  Please continue: - Metformin ER [phone] mg daily - Glipizide ER 5 mg before b'fast  Please return in 3-4  months with your sugar log.   - we checked her HbA1c: 7.7% (lower) - advised to check sugars at different times of the day - 1x a day, rotating check times - advised for yearly eye exams >> she is UTD - return to clinic in 3-4 months   2. HL - Latest lipid panel was reviewed from 01/2024 and fractions were at goal: Lab Results  Component Value Date   CHOL 153 02/08/2024   HDL 56 02/08/2024   LDLCALC 86 02/08/2024   TRIG 55 02/08/2024   CHOLHDL 2.7 02/08/2024  -She continues on Lipitor 10 mg daily without side effects  3.  Obesity class II -She continues exercising at the gym -Will continue metformin which has a weight stabilizing effect long-term -She gained 7 pounds before last visit - she lost 7 lbs since then - She plans to go to the gym more frequently.  Carlus Pavlov, MD PhD [facility]

## 2024-06-21 ENCOUNTER — Ambulatory Visit: Payer: Medicare Other | Admitting: Nurse Practitioner

## 2024-06-30 ENCOUNTER — Other Ambulatory Visit: Payer: Self-pay | Admitting: Internal Medicine

## 2024-06-30 DIAGNOSIS — Z1231 Encounter for screening mammogram for malignant neoplasm of breast: Secondary | ICD-10-CM

## 2024-07-18 ENCOUNTER — Ambulatory Visit (INDEPENDENT_AMBULATORY_CARE_PROVIDER_SITE_OTHER)

## 2024-07-18 ENCOUNTER — Other Ambulatory Visit (HOSPITAL_COMMUNITY): Payer: Self-pay

## 2024-07-18 ENCOUNTER — Other Ambulatory Visit: Payer: Self-pay

## 2024-07-18 ENCOUNTER — Ambulatory Visit: Admitting: Podiatry

## 2024-07-18 ENCOUNTER — Encounter: Payer: Self-pay | Admitting: Podiatry

## 2024-07-18 VITALS — Ht 64.0 in | Wt 198.0 lb

## 2024-07-18 DIAGNOSIS — M7661 Achilles tendinitis, right leg: Secondary | ICD-10-CM

## 2024-07-18 DIAGNOSIS — M79672 Pain in left foot: Secondary | ICD-10-CM

## 2024-07-18 DIAGNOSIS — M62462 Contracture of muscle, left lower leg: Secondary | ICD-10-CM | POA: Diagnosis not present

## 2024-07-18 DIAGNOSIS — M62461 Contracture of muscle, right lower leg: Secondary | ICD-10-CM | POA: Diagnosis not present

## 2024-07-18 DIAGNOSIS — M79671 Pain in right foot: Secondary | ICD-10-CM

## 2024-07-18 DIAGNOSIS — M7662 Achilles tendinitis, left leg: Secondary | ICD-10-CM

## 2024-07-18 MED ORDER — DICLOFENAC SODIUM 1 % EX GEL
4.0000 g | Freq: Four times a day (QID) | CUTANEOUS | 4 refills | Status: AC
  Filled 2024-07-18: qty 100, 7d supply, fill #0
  Filled 2024-08-28: qty 100, 7d supply, fill #1
  Filled 2024-12-29 (×2): qty 100, 7d supply, fill #2

## 2024-07-18 NOTE — Patient Instructions (Signed)
Look for Voltaren gel at the pharmacy over the counter or online (also known as diclofenac 1% gel). Apply to the painful areas 3-4x daily with the supplied dosing card. Allow to dry for 10 minutes before going into socks/shoes   Call to schedule physical therapy: [facility] at [place] [address]  [phone]   Achilles Tendinitis  with Rehab Achilles tendinitis is a disorder of the Achilles tendon. The Achilles tendon connects the large calf muscles (Gastrocnemius and Soleus) to the heel bone (calcaneus). This tendon is sometimes called the heel cord. It is important for pushing-off and standing on your toes and is important for walking, running, or jumping. Tendinitis is often caused by overuse and repetitive microtrauma. SYMPTOMS Pain, tenderness, swelling, warmth, and redness may occur over the Achilles tendon even at rest. Pain with pushing off, or flexing or extending the ankle. Pain that is worsened after or during activity. CAUSES  Overuse sometimes seen with rapid increase in exercise programs or in sports requiring running and jumping. Poor physical conditioning (strength and flexibility or endurance). Running sports, especially training running down hills. Inadequate warm-up before practice or play or failure to stretch before participation. Injury to the tendon. PREVENTION  Warm up and stretch before practice or competition. Allow time for adequate rest and recovery between practices and competition. Keep up conditioning. Keep up ankle and leg flexibility. Improve or keep muscle strength and endurance. Improve cardiovascular fitness. Use proper technique. Use proper equipment (shoes, skates). To help prevent recurrence, taping, protective strapping, or an adhesive bandage may be recommended for several weeks after healing is complete. PROGNOSIS  Recovery may take weeks to several months to heal. Longer recovery  is expected if symptoms have been prolonged. Recovery is usually quicker if the inflammation is due to a direct blow as compared with overuse or sudden strain. RELATED COMPLICATIONS  Healing time will be prolonged if the condition is not correctly treated. The injury must be given plenty of time to heal. Symptoms can reoccur if activity is resumed too soon. Untreated, tendinitis may increase the risk of tendon rupture requiring additional time for recovery and possibly surgery. TREATMENT  The first treatment consists of rest anti-inflammatory medication, and ice to relieve the pain. Stretching and strengthening exercises after resolution of pain will likely help reduce the risk of recurrence. Referral to a physical therapist or athletic trainer for further evaluation and treatment may be helpful. A walking boot or cast may be recommended to rest the Achilles tendon. This can help break the cycle of inflammation and microtrauma. Arch supports (orthotics) may be prescribed or recommended by your caregiver as an adjunct to therapy and rest. Surgery to remove the inflamed tendon lining or degenerated tendon tissue is rarely necessary and has shown less than predictable results. MEDICATION  Nonsteroidal anti-inflammatory medications, such as aspirin and ibuprofen, may be used for pain and inflammation relief. Do not take within 7 days before surgery. Take these as directed by your caregiver. Contact your caregiver immediately if any bleeding, stomach upset, or signs of allergic reaction occur. Other minor pain relievers, such as acetaminophen, may also be used. Pain relievers may be prescribed as necessary by your caregiver. Do not take prescription pain medication for longer than 4 to 7 days. Use only as directed and only as much as you need. Cortisone injections are rarely indicated. Cortisone injections may weaken tendons and predispose to rupture. It is better to give the condition more time   to heal  than to use them. HEAT AND COLD Cold is used to relieve pain and reduce inflammation for acute and chronic Achilles tendinitis. Cold should be applied for 10 to 15 minutes every 2 to 3 hours for inflammation and pain and immediately after any activity that aggravates your symptoms. Use ice packs or an ice massage. Heat may be used before performing stretching and strengthening activities prescribed by your caregiver. Use a heat pack or a warm soak. SEEK MEDICAL CARE IF: Symptoms get worse or do not improve in 2 weeks despite treatment. New, unexplained symptoms develop. Drugs used in treatment may produce side effects.  EXERCISES:  RANGE OF MOTION (ROM) AND STRETCHING EXERCISES - Achilles Tendinitis  These exercises may help you when beginning to rehabilitate your injury. Your symptoms may resolve with or without further involvement from your physician, physical therapist or athletic trainer. While completing these exercises, remember:  Restoring tissue flexibility helps normal motion to return to the joints. This allows healthier, less painful movement and activity. An effective stretch should be held for at least 30 seconds. A stretch should never be painful. You should only feel a gentle lengthening or release in the stretched tissue.  STRETCH  Gastroc, Standing  Place hands on wall. Extend right / left leg, keeping the front knee somewhat bent. Slightly point your toes inward on your back foot. Keeping your right / left heel on the floor and your knee straight, shift your weight toward the wall, not allowing your back to arch. You should feel a gentle stretch in the right / left calf. Hold this position for 10 seconds. Repeat 3 times. Complete this stretch 2 times per day.  STRETCH  Soleus, Standing  Place hands on wall. Extend right / left leg, keeping the other knee somewhat bent. Slightly point your toes inward on your back foot. Keep your right / left heel on the floor, bend your  back knee, and slightly shift your weight over the back leg so that you feel a gentle stretch deep in your back calf. Hold this position for 10 seconds. Repeat 3 times. Complete this stretch 2 times per day.  STRETCH  Gastrocsoleus, Standing  Note: This exercise can place a lot of stress on your foot and ankle. Please complete this exercise only if specifically instructed by your caregiver.  Place the ball of your right / left foot on a step, keeping your other foot firmly on the same step. Hold on to the wall or a rail for balance. Slowly lift your other foot, allowing your body weight to press your heel down over the edge of the step. You should feel a stretch in your right / left calf. Hold this position for 10 seconds. Repeat this exercise with a slight bend in your knee. Repeat 3 times. Complete this stretch 2 times per day.   STRENGTHENING EXERCISES - Achilles Tendinitis These exercises may help you when beginning to rehabilitate your injury. They may resolve your symptoms with or without further involvement from your physician, physical therapist or athletic trainer. While completing these exercises, remember:  Muscles can gain both the endurance and the strength needed for everyday activities through controlled exercises. Complete these exercises as instructed by your physician, physical therapist or athletic trainer. Progress the resistance and repetitions only as guided. You may experience muscle soreness or fatigue, but the pain or discomfort you are trying to eliminate should never worsen during these exercises. If this pain does worsen, stop and make   certain you are following the directions exactly. If the pain is still present after adjustments, discontinue the exercise until you can discuss the trouble with your clinician.  STRENGTH - Plantar-flexors  Sit with your right / left leg extended. Holding onto both ends of a rubber exercise band/tubing, loop it around the ball of your  foot. Keep a slight tension in the band. Slowly push your toes away from you, pointing them downward. Hold this position for 10 seconds. Return slowly, controlling the tension in the band/tubing. Repeat 3 times. Complete this exercise 2 times per day.   STRENGTH - Plantar-flexors  Stand with your feet shoulder width apart. Steady yourself with a wall or table using as little support as needed. Keeping your weight evenly spread over the width of your feet, rise up on your toes.* Hold this position for 10 seconds. Repeat 3 times. Complete this exercise 2 times per day.  *If this is too easy, shift your weight toward your right / left leg until you feel challenged. Ultimately, you may be asked to do this exercise with your right / left foot only.  STRENGTH  Plantar-flexors, Eccentric  Note: This exercise can place a lot of stress on your foot and ankle. Please complete this exercise only if specifically instructed by your caregiver.  Place the balls of your feet on a step. With your hands, use only enough support from a wall or rail to keep your balance. Keep your knees straight and rise up on your toes. Slowly shift your weight entirely to your right / left toes and pick up your opposite foot. Gently and with controlled movement, lower your weight through your right / left foot so that your heel drops below the level of the step. You will feel a slight stretch in the back of your calf at the end position. Use the healthy leg to help rise up onto the balls of both feet, then lower weight only on the right / left leg again. Build up to 15 repetitions. Then progress to 3 consecutive sets of 15 repetitions.* After completing the above exercise, complete the same exercise with a slight knee bend (about 30 degrees). Again, build up to 15 repetitions. Then progress to 3 consecutive sets of 15 repetitions.* Perform this exercise 2 times per day.  *When you easily complete 3 sets of 15, your physician,  physical therapist or athletic trainer may advise you to add resistance by wearing a backpack filled with additional weight.  STRENGTH - Plantar Flexors, Seated  Sit on a chair that allows your feet to rest flat on the ground. If necessary, sit at the edge of the chair. Keeping your toes firmly on the ground, lift your right / left heel as far as you can without increasing any discomfort in your ankle. Repeat 3 times. Complete this exercise 2 times a day.  

## 2024-07-18 NOTE — Progress Notes (Signed)
  Subjective:  Patient ID: Leah Robinson, female    DOB: [birth year],  MRN: [id number]  Chief Complaint  Patient presents with   Foot Pain    RM 21 Left heel/achillies is hurting. Patient states pain has been present for one moth in both right and left heel. Pain described as a throbbing pain. Patient states using otc pain medication and topical analgesic cream for pain.    73 y.o. female presents with the above complaint. History confirmed with patient.  The pain is in the back of the heels on both feet.  It is painful but not majorly limiting she is still able to play pickle ball 5 times a week  Objective:  Physical Exam: warm, good capillary refill, no trophic changes or ulcerative lesions, normal DP and PT pulses, and normal sensory exam. Left Foot: tenderness at Achilles tendon insertion and gastrocnemius equinus is noted with a positive silverskiold test Right Foot: tenderness at Achilles tendon insertion and gastrocnemius equinus is noted with a positive silverskiold test  No images are attached to the encounter.  Radiographs: Multiple views x-ray of both feet: no fracture, dislocation, swelling or degenerative changes noted, plantar calcaneal spur, posterior calcaneal spur, and Haglund deformity noted Assessment:   1. Heel pain, bilateral   2. Achilles tendinitis of both lower extremities   3. Gastrocnemius equinus of left lower extremity   4. Gastrocnemius equinus of right lower extremity      Plan:  Patient was evaluated and treated and all questions answered.  We reviewed her x-rays taken today and there is no major interval change or fracture or avulsion.  She does continue to have Haglund deformity pain and insertional Achilles pain as well as posterior heel spurs.  Surgically we discussed that this would be fairly extensive and given that she is able to function and perform athletic activity at this point I would not recommend this proceeding with this.  She will  continue use her heel lifts I recommended Voltaren  gel and diclofenac  gel Rx was sent to [facility], I did recommend [facility] formal physical therapy and referral will be sent to [facility].  Follow-up me in 2 months to reevaluate.  If worsening or no improvement with that point would recommend MRI.  Return in about 2 months (around 09/18/2024) for re-check Achilles tendon.

## 2024-07-25 ENCOUNTER — Encounter: Payer: Self-pay | Admitting: Nurse Practitioner

## 2024-07-25 ENCOUNTER — Ambulatory Visit: Payer: Medicare Other | Admitting: Nurse Practitioner

## 2024-07-25 VITALS — BP 120/60 | HR 58 | Temp 98.4°F | Ht 64.0 in | Wt 203.0 lb

## 2024-07-25 DIAGNOSIS — G4709 Other insomnia: Secondary | ICD-10-CM | POA: Diagnosis not present

## 2024-07-25 DIAGNOSIS — E785 Hyperlipidemia, unspecified: Secondary | ICD-10-CM | POA: Diagnosis not present

## 2024-07-25 DIAGNOSIS — E782 Mixed hyperlipidemia: Secondary | ICD-10-CM

## 2024-07-25 DIAGNOSIS — E1151 Type 2 diabetes mellitus with diabetic peripheral angiopathy without gangrene: Secondary | ICD-10-CM | POA: Diagnosis not present

## 2024-07-25 DIAGNOSIS — Z6834 Body mass index (BMI) 34.0-34.9, adult: Secondary | ICD-10-CM

## 2024-07-25 DIAGNOSIS — Z2821 Immunization not carried out because of patient refusal: Secondary | ICD-10-CM

## 2024-07-25 DIAGNOSIS — E66811 Obesity, class 1: Secondary | ICD-10-CM | POA: Insufficient documentation

## 2024-07-25 MED ORDER — ATORVASTATIN CALCIUM 10 MG PO TABS: ORAL_TABLET | ORAL | 3 refills | Status: AC

## 2024-07-25 MED ORDER — TRAZODONE HCL 50 MG PO TABS: 50.0000 mg | ORAL_TABLET | Freq: Every evening | ORAL | 1 refills | Status: AC | PRN

## 2024-07-25 NOTE — Assessment & Plan Note (Signed)
 Weight slightly increased but she feels she is losing inches.

## 2024-07-25 NOTE — Assessment & Plan Note (Signed)
 Declines shingrix, educated on disease process and is aware if he changes his mind to notify office

## 2024-07-25 NOTE — Assessment & Plan Note (Signed)
 HgbA1c improved with her Endocrinologist.

## 2024-07-25 NOTE — Assessment & Plan Note (Signed)
 Cholesterol is stable, Continue statin, tolerating well.

## 2024-07-25 NOTE — Assessment & Plan Note (Signed)
 She reports this has been ongoing for several years worsened since her husband passing but does not feel she is having difficulty with grief. She has tried an over the counter medication which has been ineffective. Will try her on trazadone and encouraged to avoid taking naps during the day.

## 2024-07-25 NOTE — Progress Notes (Signed)
 LILLETTE Kristeen JINNY Gladis, CMA,acting as a Neurosurgeon for Gaines Ada, FNP.,have documented all relevant documentation on the behalf of Gaines Ada, FNP,as directed by  Gaines Ada, FNP while in the presence of Gaines Ada, FNP.  Subjective:  Patient ID: Leah Robinson , female    DOB: 24-Jul-1951 , 73 y.o.   MRN: [id number]  Chief Complaint  Patient presents with   Diabetes    Patient presents today for a chol and dm follow up, Patient reports compliance with medication. Patient denies any chest pain, SOB, or headaches.    Insomnia    Patient reports she has been having trouble falling and staying asleep. Patient reports she as been having it for years but it is getting worse.     HPI  She is due to see Dr. Vianne at [facility] tomorrow. Continues with glipizide  (this is the newest medication) and metformin . Her blood sugars have decreased since the recent change.   Her husband passed away 03-27-2024, she feels sometimes when in the house alone this could be affecting her. She feels like she is at peace with his death and does not feel like she needs counseling.   Insomnia Primary symptoms: fragmented sleep, sleep disturbance, difficulty falling asleep, frequent awakening, no malaise/fatigue.   The current episode started more than one year. The problem occurs nightly. Exacerbated by: she admits to cat napping during the day. Treatments tried: over the counter sleep aid does not know the name. The treatment provided no relief. Typical bedtime:  Other (she is not on a schedule).  How long after going to bed to you fall asleep: 30-60 minutes (sleeps with TV on).   Sleep duration: reports this can go all off and on all day.  Prior diagnostic workup includes:  No prior workup.     Past Medical History:  Diagnosis Date   Arthritis    Diabetes mellitus without complication (HCC)    LLQ abdominal pain 02/08/2020     Family History  Problem Relation Age of Onset   Breast cancer Neg Hx       Current Outpatient Medications:    Accu-Chek FastClix Lancets MISC, USE TO CHECK BLOOD SUGAR ONCE DAILY, Disp: 102 each, Rfl: 3   aspirin  (ASPIRIN  ADULT LOW STRENGTH) 81 MG chewable tablet, Chew 1 tablet (81 mg total) by mouth daily., Disp: 30 tablet, Rfl: 1   Blood Glucose Monitoring Suppl (ACCU-CHEK GUIDE) w/Device KIT, 1 Device by Does not apply route daily., Disp: 1 kit, Rfl: 0   cholecalciferol (VITAMIN D ) 1000 UNITS tablet, Take 1,000 Units by mouth daily., Disp: , Rfl:    diclofenac  Sodium (VOLTAREN ) 1 % GEL, Apply 4 grams topically 4 (four) times daily., Disp: 100 g, Rfl: 4   glipiZIDE  (GLUCOTROL  XL) 5 MG 24 hr tablet, Take 1 tablet (5 mg total) by mouth daily with breakfast., Disp: 90 tablet, Rfl: 3   glucose blood (ACCU-CHEK GUIDE TEST) test strip, Use as instructed 1-2x a day, Disp: 100 each, Rfl: 12   metFORMIN  (GLUCOPHAGE -XR) 500 MG 24 hr tablet, Take 1-2 tablets by mouth with b'fast, Disp: , Rfl:    Multiple Vitamin (MULTIVITAMIN WITH MINERALS) TABS tablet, Take 1 tablet by mouth daily., Disp: , Rfl:    traZODone  (DESYREL ) 50 MG tablet, Take 1 tablet (50 mg total) by mouth at bedtime as needed for sleep., Disp: 30 tablet, Rfl: 1   atorvastatin  (LIPITOR) 10 MG tablet, Take 1 tablet by mouth daily, Disp: 90 tablet, Rfl: 3  No Known Allergies   Review of Systems  Constitutional: Negative.  Negative for malaise/fatigue.  HENT: Negative.    Eyes: Negative.   Respiratory: Negative.    Cardiovascular: Negative.   Gastrointestinal: Negative.   Neurological: Negative.   Psychiatric/Behavioral:  Positive for sleep disturbance. The patient has insomnia.      Today's Vitals   07/25/24 0830  BP: 120/60  Pulse: (!) 58  Temp: 98.4 F (36.9 C)  TempSrc: Oral  Weight: 203 lb (92.1 kg)  Height: 5' 4 (1.626 m)  PainSc: 0-No pain   Body mass index is 34.84 kg/m.  Wt Readings from Last 3 Encounters:  07/25/24 203 lb (92.1 kg)  07/18/24 198 lb (89.8 kg)  03/25/24 198 lb  3.2 oz (89.9 kg)     Objective:  Physical Exam Vitals and nursing note reviewed.  Constitutional:      General: She is not in acute distress.    Appearance: Normal appearance. She is well-developed. She is obese.  Cardiovascular:     Rate and Rhythm: Normal rate and regular rhythm.     Pulses: Normal pulses.     Heart sounds: Normal heart sounds. No murmur heard. Pulmonary:     Effort: Pulmonary effort is normal. No respiratory distress.     Breath sounds: Normal breath sounds. No wheezing.  Chest:     Chest wall: No tenderness.  Musculoskeletal:        General: Normal range of motion.  Skin:    General: Skin is warm and dry.     Capillary Refill: Capillary refill takes less than 2 seconds.  Neurological:     General: No focal deficit present.     Mental Status: She is alert and oriented to person, place, and time.     Cranial Nerves: No cranial nerve deficit.     Motor: No weakness.  Psychiatric:        Mood and Affect: Mood normal.        Behavior: Behavior normal.        Thought Content: Thought content normal.        Judgment: Judgment normal.      Assessment And Plan:  Type 2 diabetes mellitus with diabetic peripheral angiopathy without gangrene, without long-term current use of insulin (HCC) Assessment & Plan: HgbA1c improved with her Endocrinologist.   Orders: -     CMP14+EGFR  Mixed hyperlipidemia Assessment & Plan: Cholesterol is stable, Continue statin, tolerating well.   Orders: -     Lipid panel -     CMP14+EGFR  Other insomnia Assessment & Plan: She reports this has been ongoing for several years worsened since her husband passing but does not feel she is having difficulty with grief. She has tried an over the counter medication which has been ineffective. Will try her on trazadone and encouraged to avoid taking naps during the day.   Orders: -     TSH -     traZODone  HCl; Take 1 tablet (50 mg total) by mouth at bedtime as needed for sleep.   Dispense: 30 tablet; Refill: 1  COVID-19 vaccination declined  Herpes zoster vaccination declined Assessment & Plan: Declines shingrix, educated on disease process and is aware if he changes his mind to notify office    BMI 34.0-34.9,adult  Hyperlipidemia, unspecified hyperlipidemia type Assessment & Plan: Cholesterol is stable, Continue statin, tolerating well.   Orders: -     Atorvastatin  Calcium ; Take 1 tablet by mouth daily  Dispense: 90 tablet; Refill:  3  Obesity, morbid (HCC) Assessment & Plan: Weight slightly increased but she feels she is losing inches.      Return for controlled DM check 4 months; 6-8 weeks medication f/u.  Patient was given opportunity to ask questions. Patient verbalized understanding of the plan and was able to repeat key elements of the plan. All questions were answered to their satisfaction.    LILLETTE Gaines Ada, FNP, have reviewed all documentation for this visit. The documentation on 07/25/24 for the exam, diagnosis, procedures, and orders are all accurate and complete.   IF YOU HAVE BEEN REFERRED TO A SPECIALIST, IT MAY TAKE 1-2 WEEKS TO SCHEDULE/PROCESS THE REFERRAL. IF YOU HAVE NOT HEARD FROM US /SPECIALIST IN TWO WEEKS, PLEASE GIVE US  A CALL AT [phone].

## 2024-07-26 ENCOUNTER — Encounter: Payer: Self-pay | Admitting: Internal Medicine

## 2024-07-26 ENCOUNTER — Ambulatory Visit: Admitting: Internal Medicine

## 2024-07-26 ENCOUNTER — Other Ambulatory Visit

## 2024-07-26 VITALS — BP 154/70 | HR 54 | Ht 64.0 in | Wt 202.4 lb

## 2024-07-26 DIAGNOSIS — E66811 Obesity, class 1: Secondary | ICD-10-CM | POA: Diagnosis not present

## 2024-07-26 DIAGNOSIS — E782 Mixed hyperlipidemia: Secondary | ICD-10-CM

## 2024-07-26 DIAGNOSIS — E1165 Type 2 diabetes mellitus with hyperglycemia: Secondary | ICD-10-CM | POA: Diagnosis not present

## 2024-07-26 DIAGNOSIS — Z7984 Long term (current) use of oral hypoglycemic drugs: Secondary | ICD-10-CM

## 2024-07-26 LAB — POCT GLYCOSYLATED HEMOGLOBIN (HGB A1C): Hemoglobin A1C: 7.3 % — AB (ref 4.0–5.6)

## 2024-07-26 NOTE — Patient Instructions (Addendum)
 Please continue: - Metformin  ER [phone] mg daily - Glipizide  ER 5 mg before b'fast - let me know if you have any lows, as we may need to reduce the dose  NO JUICE or any other sweet drinks!   Please return in 4  months with your sugar log.

## 2024-07-26 NOTE — Progress Notes (Signed)
 Patient ID: Leah  White Robinson, female   DOB: April 13, 1951, 73 y.o.   MRN: [id number]   HPI: Leah  Terryann Robinson is a 73 y.o.-year-old female, initially referred by her PCP,Leah Robinson (Dr. Catheryn Robinson - [facility]),  Returning for follow-up for DM2,  initially with prediabetes diagnosed in 2016, non-insulin-dependent, uncontrolled, without long term complications. Last visit 4 months ago.  Interim history: No increased urination, blurry vision, nausea, chest pain.  She restarted to go to the [facility] consistently. She cut down on bread and sweets before last visit and continues to work on her diet but upon questioning, she is drinking juice and V8.  Reviewed HbA1c levels: Lab Results  Component Value Date   HGBA1C 7.7 (A) 03/25/2024   HGBA1C 9.1 (A) 01/21/2024   HGBA1C 7.7 08/13/2023   HGBA1C 8.4 (A) 04/03/2023   HGBA1C 7.8 (A) 08/07/2022   HGBA1C 8.1 (A) 01/31/2022   HGBA1C 8.6 (A) 10/29/2021   HGBA1C 8.8 (H) 04/10/2021   HGBA1C 9.2 (A) 10/26/2020   HGBA1C 7.2 (A) 02/08/2020   HGBA1C 8.5 (H) 08/31/2019   HGBA1C 7.7 (A) 09/22/2018   HGBA1C 7.9 (A) 05/18/2018   HGBA1C 8.1 01/19/2018   HGBA1C 7.7 08/17/2017   HGBA1C 6.9 02/27/2017  08/13/2023: HbA1c 7.7% 04/03/2023: HbA1c calculated from fructosamine is 8.0%, not much different than the directly measured HbA1c. 09/05/2019: HbA1c calculated from fructosamine: 7.2% 09/22/2018: HbA1c calculated from fructosamine 6.5% 05/18/2018: HbA1c calculated from fructosamine is much better, at 6.5%. 06/30/2017: HbA1c 7.8% Reportedly, prev. in the 6-7% range.  She is on: - Metform ER 500 >> 1000 mg with dinner...>> 500 mg daily >> 2x a day >> 1-2x a day (b/c diarrhea) - Glipizide  ER 5 mg before b'fast We tried Jardiance  10 mg daily-did not start in 03/2023 - not covered. In 10/2020, Farxiga  was not affordable. We previously tried Metformin  ER 1000 mg with dinner-started 08/2019 >> stopped 09/2019 2/2 AP/constipation. She previously tried  metformin  IR  -started 2016 >> stopped 2/2 diarrhea and abdominal pain.  Pt checks sugars 0-1x a day: - am:  131, 136 >> 141-167, 224 >> 85, 129-169 - 2h after b'fast: n/c >> 156 >> n/c >> 131 >> n/c - before lunch:  129, 151>> 98-149 >> 88-165 >> 81-120, 172 - 2h after lunch: n/c >> 108, 129 >> n/c >> 100-131 >> 82 - before dinner: 89-114 >> 100, 137 >> 85-109 >> 124, 139 - 2h after dinner: 115-139 >> 129, 164 >> 92, 131 >> 82-166 - bedtime: 126 >> n/c >> 133 >> n/c >> 104-121 >> n/c - nighttime: n/c Lowest: 89 >> 92 >> 85 >> 77 Highest: 220 >> ... 224 >> 172  Glucometer: Accu-Chek guide   Pt's meals are: - Breakfast: toast, bacon, cereal, sandwich, ham bisquit, boiled egg - Lunch: salad + nuts, [place] sandwich, tuna - Dinner: greens or veggies, chicken/pork/fish - at ~6:30 pm   -+ Mild CKD, last BUN/creatinine:  Lab Results  Component Value Date   BUN 18 02/08/2024   BUN 10 08/19/2023   CREATININE 0.84 02/08/2024   CREATININE 0.72 08/19/2023   Lab Results  Component Value Date   MICRALBCREAT 11 02/08/2024   MICRALBCREAT 15 01/21/2024   MICRALBCREAT 17 02/03/2023   MICRALBCREAT 9 01/20/2022   MICRALBCREAT 30-300 04/10/2021   MICRALBCREAT 30 02/27/2020   MICRALBCREAT <30 02/18/2019   -+ HL. Latest lipids: Lab Results  Component Value Date   CHOL 153 02/08/2024   HDL 56 02/08/2024   LDLCALC 86 02/08/2024  TRIG 55 02/08/2024   CHOLHDL 2.7 02/08/2024  On Lipitor 10 mg daily.  - last eye exam was on 01/13/2024: No DR, low grade cataracts. She goes every 3 mo b/c increased IOP.  -+ Occasional numbness and tingling in her feet.  Last foot exam 07/18/2024 by Dr. Silva.  Before the coronavirus pandemic, she was going to the gym: swimming, walking, weights, Silver sneakers class-  5-6x a week.  [facility]  ROS: + See HPI  I reviewed pt's medications, allergies, PMH, social hx, family hx, and changes were documented in the history of  present illness. Otherwise, unchanged from my initial visit note.  Past Medical History:  Diagnosis Date   Arthritis    Diabetes mellitus without complication (HCC)    LLQ abdominal pain 02/08/2020   Past Surgical History:  Procedure Laterality Date   DILATATION & CURRETTAGE/HYSTEROSCOPY WITH RESECTOCOPE N/A 06/21/2015   Procedure: DILATATION & CURETTAGE/HYSTEROSCOPY WITH RESECTOCOPE;  Surgeon: Leah Carder, MD;  Location: [facility];  Service: Gynecology;  Laterality: N/A;   ROBOTIC ASSISTED SALPINGO OOPHERECTOMY Left 06/21/2015   Procedure: ROBOTIC ASSISTED LEFT SALPINGO OOPHORECTOMY AND RIGHT SALPINGECTOMY With  Pelvic Washings;  Surgeon: Leah Carder, MD;  Location: [facility];  Service: Gynecology;  Laterality: Left;  2 1/2 hrs.   TUBAL LIGATION     Social History   Social History   Marital status: Married    Spouse name: N/A   Number of children: 1   Occupational History   Retired from [employer]   Social History Main Topics   Smoking status: Former Games developer, quit in 1995   Smokeless tobacco: Never Used   Alcohol use No   Drug use: No   Current Outpatient Medications on File Prior to Visit  Medication Sig Dispense Refill   Accu-Chek FastClix Lancets MISC USE TO CHECK BLOOD SUGAR ONCE DAILY 102 each 3   aspirin  (ASPIRIN  ADULT LOW STRENGTH) 81 MG chewable tablet Chew 1 tablet (81 mg total) by mouth daily. 30 tablet 1   atorvastatin  (LIPITOR) 10 MG tablet Take 1 tablet by mouth daily 90 tablet 3   Blood Glucose Monitoring Suppl (ACCU-CHEK GUIDE) w/Device KIT 1 Device by Does not apply route daily. 1 kit 0   cholecalciferol (VITAMIN D ) 1000 UNITS tablet Take 1,000 Units by mouth daily.     diclofenac  Sodium (VOLTAREN ) 1 % GEL Apply 4 grams topically 4 (four) times daily. 100 g 4   glipiZIDE  (GLUCOTROL  XL) 5 MG 24 hr tablet Take 1 tablet (5 mg total) by mouth daily with breakfast. 90 tablet 3   glucose blood (ACCU-CHEK GUIDE TEST) test strip Use as instructed 1-2x a day 100 each  12   metFORMIN  (GLUCOPHAGE -XR) 500 MG 24 hr tablet Take 1-2 tablets by mouth with b'fast     Multiple Vitamin (MULTIVITAMIN WITH MINERALS) TABS tablet Take 1 tablet by mouth daily.     traZODone  (DESYREL ) 50 MG tablet Take 1 tablet (50 mg total) by mouth at bedtime as needed for sleep. 30 tablet 1   No current facility-administered medications on file prior to visit.   No Known Allergies   Pertinent FH: Pt has FH of DM in mother, maternal uncle.  PE: BP (!) 154/70   Pulse (!) 54   Ht 5' 4 (1.626 m)   Wt 202 lb 6.4 oz (91.8 kg)   SpO2 96%   BMI 34.74 kg/m  Wt Readings from Last 3 Encounters:  07/26/24 202 lb 6.4 oz (91.8  kg)  07/25/24 203 lb (92.1 kg)  07/18/24 198 lb (89.8 kg)   Constitutional: overweight, in NAD Eyes: no exophthalmos ENT: no masses palpated in neck, no cervical lymphadenopathy Cardiovascular: RRR, No MRG Respiratory: CTA B Musculoskeletal: no deformities Skin:  no rashes Neurological: no tremor with outstretched hands  ASSESSMENT: 1. DM2, non-insulin-dependent, without long term complications, but with hyperglycemia  2. HL  3.  Obesity class I  PLAN:  1. Patient with previously well-controlled type 2 diabetes, on metformin  and sulfonylurea, with improved control at last visit, when HbA1c decreased from 9.1% to 7.7%.  Sugars were still above target in the morning likely due to eating dinners late, after staying with her husband at the facility.  When I last saw her, her husband just passed away 4 days prior.  She was breathing, but was able to eat dinner earlier and was planning to go to the gym more frequently.  We did not change her regimen, but discussed about taking 2 tablets of metformin  daily if she can tolerate them. -At today's visit, sugars are mostly at goal but she does have hyperglycemic exceptions especially in the morning.  Upon questioning, she has juice with breakfast or dinner.  I strongly advised her to eliminate this to avoid  hyperglycemic spikes.  Otherwise, we can continue the same regimen.  I did advise her that if her sugars improve after stopping juice, we may need to back off her glipizide  dose.  I advised her to let me know. - I suggested to:  Patient Instructions  Please continue: - Metformin  ER [phone] mg daily - Glipizide  ER 5 mg before b'fast - let me know if you have any lows, as we may need to reduce the dose  NO JUICE or any other sweet drinks!   Please return in 4  months with your sugar log.   - we checked her HbA1c: 7.3% (lower) - advised to check sugars at different times of the day - 1x a day, rotating check times - advised for yearly eye exams >> she is UTD - return to clinic in 4 months   2. HL - Latest lipid panel was reviewed from 01/2024: Fractions at goal: Lab Results  Component Value Date   CHOL 153 02/08/2024   HDL 56 02/08/2024   LDLCALC 86 02/08/2024   TRIG 55 02/08/2024   CHOLHDL 2.7 02/08/2024  -She continues Lipitor 10 mg daily without side effects  3.  Obesity class I - She exercises at the gym - Will continue metformin  which has a weight stabilizing effect long-term - She lost 7 pounds before last visit and gained 4 pounds since then  Lela Fendt, MD PhD [facility]

## 2024-07-27 ENCOUNTER — Ambulatory Visit: Payer: Self-pay | Admitting: Nurse Practitioner

## 2024-07-27 LAB — CMP14+EGFR
ALT: 18 IU/L (ref 0–32)
AST: 19 IU/L (ref 0–40)
Albumin: 4.2 g/dL (ref 3.8–4.8)
Alkaline Phosphatase: 76 IU/L (ref 44–121)
BUN/Creatinine Ratio: 18 (ref 12–28)
BUN: 13 mg/dL (ref 8–27)
Bilirubin Total: 0.4 mg/dL (ref 0.0–1.2)
CO2: 24 mmol/L (ref 20–29)
Calcium: 9.7 mg/dL (ref 8.7–10.3)
Chloride: 103 mmol/L (ref 96–106)
Creatinine, Ser: 0.74 mg/dL (ref 0.57–1.00)
Globulin, Total: 3 g/dL (ref 1.5–4.5)
Glucose: 129 mg/dL — ABNORMAL HIGH (ref 70–99)
Potassium: 4.8 mmol/L (ref 3.5–5.2)
Sodium: 140 mmol/L (ref 134–144)
Total Protein: 7.2 g/dL (ref 6.0–8.5)
eGFR: 85 mL/min/1.73 (ref >59)

## 2024-07-27 LAB — LIPID PANEL
Chol/HDL Ratio: 2.7 ratio (ref 0.0–4.4)
Cholesterol, Total: 165 mg/dL (ref 100–199)
HDL: 62 mg/dL (ref >39)
LDL Chol Calc (NIH): 88 mg/dL (ref 0–99)
Triglycerides: 80 mg/dL (ref 0–149)
VLDL Cholesterol Cal: 15 mg/dL (ref 5–40)

## 2024-07-27 LAB — TSH: TSH: 2.04 u[IU]/mL (ref 0.450–4.500)

## 2024-08-01 ENCOUNTER — Ambulatory Visit: Attending: Podiatry | Admitting: Physical Therapy

## 2024-08-01 ENCOUNTER — Other Ambulatory Visit: Payer: Self-pay

## 2024-08-01 ENCOUNTER — Encounter: Payer: Self-pay | Admitting: Physical Therapy

## 2024-08-01 DIAGNOSIS — M7661 Achilles tendinitis, right leg: Secondary | ICD-10-CM | POA: Insufficient documentation

## 2024-08-01 DIAGNOSIS — M6281 Muscle weakness (generalized): Secondary | ICD-10-CM | POA: Insufficient documentation

## 2024-08-01 DIAGNOSIS — M25571 Pain in right ankle and joints of right foot: Secondary | ICD-10-CM | POA: Diagnosis present

## 2024-08-01 DIAGNOSIS — M62461 Contracture of muscle, right lower leg: Secondary | ICD-10-CM | POA: Diagnosis not present

## 2024-08-01 DIAGNOSIS — M62462 Contracture of muscle, left lower leg: Secondary | ICD-10-CM | POA: Insufficient documentation

## 2024-08-01 DIAGNOSIS — M7662 Achilles tendinitis, left leg: Secondary | ICD-10-CM | POA: Diagnosis not present

## 2024-08-01 DIAGNOSIS — R261 Paralytic gait: Secondary | ICD-10-CM | POA: Diagnosis present

## 2024-08-01 DIAGNOSIS — M25572 Pain in left ankle and joints of left foot: Secondary | ICD-10-CM | POA: Diagnosis present

## 2024-08-01 NOTE — Therapy (Signed)
 OUTPATIENT PHYSICAL THERAPY LOWER EXTREMITY EVALUATION   Patient Name: Leah  Zyaira Robinson MRN: [id number] DOB:1951-05-30, 73 y.o., female Today's Date: 08/01/2024  END OF SESSION:  PT End of Session - 08/01/24 1343     Visit Number 1    Number of Visits 8    Date for PT Re-Evaluation 09/26/24    Authorization Type [facility] Medicare    Authorization Time Period Auth request 08/01/24    Progress Note Due on Visit 10    PT Start Time 1350    PT Stop Time 1430    PT Time Calculation (min) 40 min    Activity Tolerance Patient tolerated treatment well          Past Medical History:  Diagnosis Date   Arthritis    Diabetes mellitus without complication (HCC)    LLQ abdominal pain 02/08/2020   Past Surgical History:  Procedure Laterality Date   DILATATION & CURRETTAGE/HYSTEROSCOPY WITH RESECTOCOPE N/A 06/21/2015   Procedure: DILATATION & CURETTAGE/HYSTEROSCOPY WITH RESECTOCOPE;  Surgeon: Dickie Carder, MD;  Location: [facility];  Service: Gynecology;  Laterality: N/A;   ROBOTIC ASSISTED SALPINGO OOPHERECTOMY Left 06/21/2015   Procedure: ROBOTIC ASSISTED LEFT SALPINGO OOPHORECTOMY AND RIGHT SALPINGECTOMY With  Pelvic Washings;  Surgeon: Dickie Carder, MD;  Location: [facility];  Service: Gynecology;  Laterality: Left;  2 1/2 hrs.   TUBAL LIGATION     Patient Active Problem List   Diagnosis Date Noted   Class 1 obesity due to excess calories with body mass index (BMI) of 34.0 to 34.9 in adult 07/25/2024   COVID-19 vaccination declined 07/25/2024   Other insomnia 07/25/2024   BMI 35.0-35.9,adult 02/08/2024   Left upper quadrant abdominal mass 02/08/2024   Encounter for annual health examination 02/08/2024   Herpes zoster vaccination declined 08/19/2023   Influenza vaccination declined 08/19/2023   Vitamin D  deficiency 08/19/2023   Type 2 diabetes mellitus with diabetic peripheral angiopathy without gangrene, without long-term current use of insulin (HCC) 02/16/2023   Diverticular  disease of colon 02/02/2023   Obesity, morbid (HCC) 02/02/2023   PAD (peripheral artery disease) (HCC) 01/20/2022   Calculus of gallbladder without cholecystitis without obstruction 02/22/2020   Hyperlipidemia 08/17/2017    PCP: Georgina Speaks, FNP  REFERRING PROVIDER: Silva Juliene SAUNDERS, DPM  REFERRING DIAG:  [phone] (ICD-10-CM) - Achilles tendinitis of both lower extremities  M62.462 (ICD-10-CM) - Gastrocnemius equinus of left lower extremity  M62.461 (ICD-10-CM) - Gastrocnemius equinus of right lower extremity    THERAPY DIAG:  Pain in left ankle and joints of left foot  Pain in right ankle and joints of right foot  Paralytic gait  Muscle weakness (generalized)  Rationale for Evaluation and Treatment: Rehabilitation  ONSET DATE: ~1 month  SUBJECTIVE:   SUBJECTIVE STATEMENT: Pt reports arthritis in both of her feet. She reports pain in posterior heels. Has been using arthritis pain cream. Describes it as aching. Slow onset of pain. Exercises 5x/wk. Lifts weight, plays pickleball, swims ~45 min. Doesn't like getting on the floor and exercise.   PERTINENT HISTORY: DM  PAIN:  Are you having pain? Yes: NPRS scale: 5-6 currently; at worst 7-8 Pain location: posterior bil heels and top of foot thinks L may be worse than R Pain description: aching Aggravating factors: walking, getting up  Relieving factors: Tylenol   PRECAUTIONS: None  RED FLAGS: None   WEIGHT BEARING RESTRICTIONS: No  FALLS:  Has patient fallen in last 6 months? No  LIVING ENVIRONMENT: Lives with: lives alone Lives in: House/apartment Stairs: No  Has ramp and 2 steps to enter Has following equipment at home: None  OCCUPATION: Works 1 day/wk as a IT consultant  PLOF: Independent  PATIENT GOALS: Improve pain  NEXT MD VISIT: PRN  OBJECTIVE:  Note: Objective measures were completed at Evaluation unless otherwise noted.  DIAGNOSTIC FINDINGS: 07/18/24 Multiple views x-ray of both feet: no  fracture, dislocation, swelling or degenerative changes noted, plantar calcaneal spur, posterior calcaneal spur, and Haglund deformity noted   PATIENT SURVEYS:  Lower Extremity Functional Score: 74 / 80 = 92.5 %  COGNITION: Overall cognitive status: Within functional limits for tasks assessed     SENSATION: WFL; occasional tingling  EDEMA:  No swelling  MUSCLE LENGTH: Did not assess  POSTURE: No Significant postural limitations  PALPATION: Hypomobile L foot and ankle compared to R but both are grossly hypomobile No overt muscle tenderness to palpation  LOWER EXTREMITY ROM:  ROM Right eval Left eval  Hip flexion    Hip extension    Hip abduction    Hip adduction    Hip internal rotation    Hip external rotation    Knee flexion    Knee extension    Ankle dorsiflexion A: 0 P: 10 A: -10 P: 0  Ankle plantarflexion A: 38 A: 45  Ankle inversion A: 23 A: 15  Ankle eversion A: 20 A: 14   (Blank rows = not tested)  LOWER EXTREMITY MMT:  MMT Right eval Left eval  Hip flexion 3+ 3+  Hip extension 4- 4-  Hip abduction 3+ 3+  Hip adduction    Hip internal rotation    Hip external rotation    Knee flexion 5 5  Knee extension 5 5  Ankle dorsiflexion 5 5  Ankle plantarflexion 4+ Painful on leg 4-  Ankle inversion 5 5  Ankle eversion 5 5   (Blank rows = not tested)  LOWER EXTREMITY SPECIAL TESTS:  Did not assess  FUNCTIONAL TESTS:  SLS TBA  GAIT: Distance walked: Into clinic Assistive device utilized: None Level of assistance: Complete Independence Comments: Mildly antalgic with decreased toe off and heel strike bilat                                                                                                                                TREATMENT DATE: 08/01/24 See HEP below    PATIENT EDUCATION:  Education details: Exam findings, POC, initial HEP, discussed heel lift use Person educated: Patient Education method: Explanation, Demonstration, and  Handouts Education comprehension: verbalized understanding, returned demonstration, and needs further education  HOME EXERCISE PROGRAM: Access Code: [email or web address] URL: [email or web address] Date: 08/01/2024 Prepared by: Quincee Gittens April Marie Itxel Wickard  Exercises - Gastroc Stretch on Wall  - 1 x daily - 7 x weekly - 2 sets - 30 sec hold - Soleus Stretch on Wall  - 1 x daily - 7 x weekly - 2 sets - 30 sec hold - Heel Raises with Counter Support  -  3 x daily - 7 x weekly - 10 reps - 3 sec hold - Toe Raises with Counter Support  - 3 x daily - 7 x weekly - 10 reps - Toe Yoga - Alternating Great Toe and Lesser Toe Extension  - 3 x daily - 7 x weekly - 10 reps - Toe Spreading  - 3 x daily - 7 x weekly - 1 sets - 10 reps  ASSESSMENT:  CLINICAL IMPRESSION: Patient is a 73 y.o. F who was seen today for physical therapy evaluation and treatment for bilat Achilles tendonitis. PMH significant for bilat bunions and DM. Assessment is significant for foot/ankle hypomobility leading to reduced ROM (L worse than R), gastroc/soleus weakness, intrinsic foot weakness, and gross bilat hip weakness affecting pt's standing and weight bearing activities. Pt will benefit from PT to keep from worsening pain and improve overall endurance to standing and walking without pain.   OBJECTIVE IMPAIRMENTS: Abnormal gait, decreased activity tolerance, decreased balance, decreased coordination, decreased endurance, decreased mobility, difficulty walking, decreased ROM, decreased strength, hypomobility, improper body mechanics, postural dysfunction, and pain.   ACTIVITY LIMITATIONS: sitting, standing, stairs, transfers, and locomotion level  PARTICIPATION LIMITATIONS: meal prep, cleaning, laundry, shopping, and community activity  PERSONAL FACTORS: Age, Fitness, Past/current experiences, and Time since onset of injury/illness/exacerbation are also affecting patient's functional outcome.   REHAB POTENTIAL:  Good  CLINICAL DECISION MAKING: Evolving/moderate complexity  EVALUATION COMPLEXITY: Moderate   GOALS: Goals reviewed with patient? Yes  SHORT TERM GOALS: Target date: 08/29/2024  Pt will be ind with initial HEP Baseline: Goal status: INITIAL  2.  Pt will report improved pain by >/=50% Baseline:  Goal status: INITIAL    LONG TERM GOALS: Target date: 09/26/2024   Pt will be ind with management and progression of HEP Baseline:  Goal status: INITIAL  2.  Pt will demo bilat ankle ROM to at least 10 deg of DF for improved calf muscle lengthening Baseline:  Goal status: INITIAL  3.  Pt will report improved pain by >/=75% for all activities Baseline:  Goal status: INITIAL  4.  Pt will have improved LEFS to 80/80 to demo MCID Baseline:  Goal status: INITIAL    PLAN:  PT FREQUENCY: 1x/week  PT DURATION: 8 weeks  PLANNED INTERVENTIONS: 97164- PT Re-evaluation, 97750- Physical Performance Testing, 97110-Therapeutic exercises, 97530- Therapeutic activity, [id number]- Neuromuscular re-education, 97535- Self Care, 02859- Manual therapy, G0283- Electrical stimulation (unattended), [phone]- Ionotophoresis 4mg /ml Dexamethasone , 79439 (1-2 muscles), 20561 (3+ muscles)- Dry Needling, Patient/Family education, Balance training, Stair training, Joint mobilization, Spinal mobilization, Cryotherapy, and Moist heat  PLAN FOR NEXT SESSION: Assess response to HEP. How is heel lift? Take one layer off? Continue ankle/foot/toe strengthening and stretching. Ankle/foot mobilizations. Add hip strengthening. Exercises she can perform in water (likes to swim).    Zamyah Wiesman April Ma L Kira Hartl, PT, DPT 08/01/2024, 3:36 PM  Date of referral: 07/18/24 Referring provider: Silva Juliene SAUNDERS, DPM Referring diagnosis?  M76.61,M76.62 (ICD-10-CM) - Achilles tendinitis of both lower extremities  M62.462 (ICD-10-CM) - Gastrocnemius equinus of left lower extremity  M62.461 (ICD-10-CM) - Gastrocnemius equinus of right  lower extremity   Treatment diagnosis? (if different than referring diagnosis) Pain in left ankle and joints of left foot [M25.572]   What was this (referring dx) caused by? Ongoing Issue, Arthritis, and Unspecified  Nature of Condition: Initial Onset (within last 3 months)   Laterality: Both  Current Functional Measure Score: LEFS 92.5%  Objective measurements identify impairments when they are compared to normal values,  the uninvolved extremity, and prior level of function.  [x]  Yes  []  No  Objective assessment of functional ability: Minimal functional limitations   Briefly describe symptoms: Aching  How did symptoms start: Slow onset  Average pain intensity:  Last 24 hours: 5-6  Past week: 7-8  How often does the pt experience symptoms? Constantly  How much have the symptoms interfered with usual daily activities? A little bit  How has condition changed since care began at this facility? NA - initial visit  In general, how is the patients overall health? Very Good   BACK PAIN (STarT Back Screening Tool) No

## 2024-08-08 ENCOUNTER — Ambulatory Visit: Admitting: Physical Therapy

## 2024-08-09 ENCOUNTER — Ambulatory Visit

## 2024-08-09 DIAGNOSIS — M25571 Pain in right ankle and joints of right foot: Secondary | ICD-10-CM

## 2024-08-09 DIAGNOSIS — M25572 Pain in left ankle and joints of left foot: Secondary | ICD-10-CM | POA: Diagnosis not present

## 2024-08-09 NOTE — Therapy (Signed)
 OUTPATIENT PHYSICAL THERAPY NOTE   Patient Name: Leah  Nyrie Robinson MRN: [id number] DOB:10/08/1951, 73 y.o., female Today's Date: 08/09/2024  END OF SESSION:  PT End of Session - 08/09/24 1131     Visit Number 2    Number of Visits 8    Date for PT Re-Evaluation 09/26/24    Authorization Type [facility] Medicare    Authorization Time Period Auth request 08/01/24    PT Start Time 1131    PT Stop Time 1214    PT Time Calculation (min) 43 min           Past Medical History:  Diagnosis Date   Arthritis    Diabetes mellitus without complication (HCC)    LLQ abdominal pain 02/08/2020   Past Surgical History:  Procedure Laterality Date   DILATATION & CURRETTAGE/HYSTEROSCOPY WITH RESECTOCOPE N/A 06/21/2015   Procedure: DILATATION & CURETTAGE/HYSTEROSCOPY WITH RESECTOCOPE;  Surgeon: Dickie Carder, MD;  Location: [facility];  Service: Gynecology;  Laterality: N/A;   ROBOTIC ASSISTED SALPINGO OOPHERECTOMY Left 06/21/2015   Procedure: ROBOTIC ASSISTED LEFT SALPINGO OOPHORECTOMY AND RIGHT SALPINGECTOMY With  Pelvic Washings;  Surgeon: Dickie Carder, MD;  Location: [facility];  Service: Gynecology;  Laterality: Left;  2 1/2 hrs.   TUBAL LIGATION     Patient Active Problem List   Diagnosis Date Noted   Class 1 obesity due to excess calories with body mass index (BMI) of 34.0 to 34.9 in adult 07/25/2024   COVID-19 vaccination declined 07/25/2024   Other insomnia 07/25/2024   BMI 35.0-35.9,adult 02/08/2024   Left upper quadrant abdominal mass 02/08/2024   Encounter for annual health examination 02/08/2024   Herpes zoster vaccination declined 08/19/2023   Influenza vaccination declined 08/19/2023   Vitamin D  deficiency 08/19/2023   Type 2 diabetes mellitus with diabetic peripheral angiopathy without gangrene, without long-term current use of insulin (HCC) 02/16/2023   Diverticular disease of colon 02/02/2023   Obesity, morbid (HCC) 02/02/2023   PAD (peripheral artery disease) (HCC)  01/20/2022   Calculus of gallbladder without cholecystitis without obstruction 02/22/2020   Hyperlipidemia 08/17/2017    PCP: Georgina Speaks, FNP  REFERRING PROVIDER: Silva Juliene SAUNDERS, DPM  REFERRING DIAG:  [phone] (ICD-10-CM) - Achilles tendinitis of both lower extremities  M62.462 (ICD-10-CM) - Gastrocnemius equinus of left lower extremity  M62.461 (ICD-10-CM) - Gastrocnemius equinus of right lower extremity    THERAPY DIAG:  Pain in left ankle and joints of left foot  Pain in right ankle and joints of right foot  Rationale for Evaluation and Treatment: Rehabilitation  ONSET DATE: ~1 month  SUBJECTIVE:   SUBJECTIVE STATEMENT:  08/09/2024 Patient reports that since her evaluation last week, she has been stretching in the pool and under the hot water in the shower. She feels that her symptoms are starting to improve. Her pain is 5/10 today.   EVAL: Pt reports arthritis in both of her feet. She reports pain in posterior heels. Has been using arthritis pain cream. Describes it as aching. Slow onset of pain. Exercises 5x/wk. Lifts weight, plays pickleball, swims ~45 min. Doesn't like getting on the floor and exercise.   PERTINENT HISTORY: DM  PAIN:  Are you having pain? Yes: NPRS scale: 5-6 currently; at worst 7-8 Pain location: posterior bil heels and top of foot thinks L may be worse than R Pain description: aching Aggravating factors: walking, getting up  Relieving factors: Tylenol   PRECAUTIONS: None  RED FLAGS: None   WEIGHT BEARING RESTRICTIONS: No  FALLS:  Has patient fallen in  last 6 months? No  LIVING ENVIRONMENT: Lives with: lives alone Lives in: House/apartment Stairs: No Has ramp and 2 steps to enter Has following equipment at home: None  OCCUPATION: Works 1 day/wk as a IT consultant  PLOF: Independent  PATIENT GOALS: Improve pain  NEXT MD VISIT: PRN  OBJECTIVE:  Note: Objective measures were completed at Evaluation unless otherwise  noted.  DIAGNOSTIC FINDINGS: 07/18/24 Multiple views x-ray of both feet: no fracture, dislocation, swelling or degenerative changes noted, plantar calcaneal spur, posterior calcaneal spur, and Haglund deformity noted   PATIENT SURVEYS:  Lower Extremity Functional Score: 74 / 80 = 92.5 %  COGNITION: Overall cognitive status: Within functional limits for tasks assessed     SENSATION: WFL; occasional tingling  EDEMA:  No swelling  MUSCLE LENGTH: Did not assess  POSTURE: No Significant postural limitations  PALPATION: Hypomobile L foot and ankle compared to R but both are grossly hypomobile No overt muscle tenderness to palpation  LOWER EXTREMITY ROM:  ROM Right eval Left eval  Hip flexion    Hip extension    Hip abduction    Hip adduction    Hip internal rotation    Hip external rotation    Knee flexion    Knee extension    Ankle dorsiflexion A: 0 P: 10 A: -10 P: 0  Ankle plantarflexion A: 38 A: 45  Ankle inversion A: 23 A: 15  Ankle eversion A: 20 A: 14   (Blank rows = not tested)  LOWER EXTREMITY MMT:  MMT Right eval Left eval  Hip flexion 3+ 3+  Hip extension 4- 4-  Hip abduction 3+ 3+  Hip adduction    Hip internal rotation    Hip external rotation    Knee flexion 5 5  Knee extension 5 5  Ankle dorsiflexion 5 5  Ankle plantarflexion 4+ Painful on leg 4-  Ankle inversion 5 5  Ankle eversion 5 5   (Blank rows = not tested)  LOWER EXTREMITY SPECIAL TESTS:  Did not assess  FUNCTIONAL TESTS:  SLS TBA  GAIT: Distance walked: Into clinic Assistive device utilized: None Level of assistance: Complete Independence Comments: Mildly antalgic with decreased toe off and heel strike bilat                                                                                                                                TREATMENT DATE:   [facility] Adult PT Treatment:                                                DATE: 08/09/2024  Therapeutic Exercise: Review  HEP and provided red TB for resisted ankle  Seated dynadisc  Ankle pumps x 10 each  Ankle circles x 15 each Toe curl and spreading x 15 each direction  Towel  scrunches x2 rounds (~5 min)  Ankle 4-way RTB 2 x 10 each BIL  Therapeutic Activity:  Patient education regarding importance of regular activity, and strength training for rehabilitation and healthy aging  Reviewed and discussed indications for use of heel lift and adjusting to lower height as able.      PATIENT EDUCATION:  Education details: Exam findings, POC, initial HEP, discussed heel lift use Person educated: Patient Education method: Explanation, Demonstration, and Handouts Education comprehension: verbalized understanding, returned demonstration, and needs further education  HOME EXERCISE PROGRAM: Access Code: [email or web address] URL: [email or web address] Date: 08/01/2024 Prepared by: Gellen April Marie Nonato  Exercises - Gastroc Stretch on Wall  - 1 x daily - 7 x weekly - 2 sets - 30 sec hold - Soleus Stretch on Wall  - 1 x daily - 7 x weekly - 2 sets - 30 sec hold - Heel Raises with Counter Support  - 3 x daily - 7 x weekly - 10 reps - 3 sec hold - Toe Raises with Counter Support  - 3 x daily - 7 x weekly - 10 reps - Toe Yoga - Alternating Great Toe and Lesser Toe Extension  - 3 x daily - 7 x weekly - 10 reps - Toe Spreading  - 3 x daily - 7 x weekly - 1 sets - 10 reps  ASSESSMENT:  CLINICAL IMPRESSION: 08/09/2024 Leah Robinson  had good tolerance of initial treatment session. Tactile and Verbal cues required for instruction. Patient had some difficulty with toe intrinsic and L ankle circles today . Patient requires ongoing skilled PT intervention to address current impairments and related functional deficits. We will continue to progress as tolerated.     EVAL: Patient is a 73 y.o. F who was seen today for physical therapy evaluation and treatment for bilat Achilles tendonitis. PMH significant for bilat bunions  and DM. Assessment is significant for foot/ankle hypomobility leading to reduced ROM (L worse than R), gastroc/soleus weakness, intrinsic foot weakness, and gross bilat hip weakness affecting pt's standing and weight bearing activities. Pt will benefit from PT to keep from worsening pain and improve overall endurance to standing and walking without pain.   OBJECTIVE IMPAIRMENTS: Abnormal gait, decreased activity tolerance, decreased balance, decreased coordination, decreased endurance, decreased mobility, difficulty walking, decreased ROM, decreased strength, hypomobility, improper body mechanics, postural dysfunction, and pain.   ACTIVITY LIMITATIONS: sitting, standing, stairs, transfers, and locomotion level  PARTICIPATION LIMITATIONS: meal prep, cleaning, laundry, shopping, and community activity  PERSONAL FACTORS: Age, Fitness, Past/current experiences, and Time since onset of injury/illness/exacerbation are also affecting patient's functional outcome.   REHAB POTENTIAL: Good  CLINICAL DECISION MAKING: Evolving/moderate complexity  EVALUATION COMPLEXITY: Moderate   GOALS: Goals reviewed with patient? Yes  SHORT TERM GOALS: Target date: 08/29/2024  Pt will be ind with initial HEP Baseline: Goal status: INITIAL  2.  Pt will report improved pain by >/=50% Baseline:  Goal status: INITIAL    LONG TERM GOALS: Target date: 09/26/2024   Pt will be ind with management and progression of HEP Baseline:  Goal status: INITIAL  2.  Pt will demo bilat ankle ROM to at least 10 deg of DF for improved calf muscle lengthening Baseline:  Goal status: INITIAL  3.  Pt will report improved pain by >/=75% for all activities Baseline:  Goal status: INITIAL  4.  Pt will have improved LEFS to 80/80 to demo MCID Baseline:  Goal status: INITIAL    PLAN:  PT FREQUENCY: 1x/week  PT DURATION: 8 weeks  PLANNED INTERVENTIONS: 97164- PT Re-evaluation, 97750- Physical Performance Testing,  97110-Therapeutic exercises, 97530- Therapeutic activity, [id number]- Neuromuscular re-education, 97535- Self Care, 02859- Manual therapy, G0283- Electrical stimulation (unattended), [phone]- Ionotophoresis 4mg /ml Dexamethasone , 20560 (1-2 muscles), 20561 (3+ muscles)- Dry Needling, Patient/Family education, Balance training, Stair training, Joint mobilization, Spinal mobilization, Cryotherapy, and Moist heat  PLAN FOR NEXT SESSION: Assess response to HEP. How is heel lift? Take one layer off? Continue ankle/foot/toe strengthening and stretching. Ankle/foot mobilizations. Add hip strengthening. Exercises she can perform in water (likes to swim).    Marko Molt, PT, DPT  08/09/2024 1:19 PM   Date of referral: 07/18/24 Referring provider: Georgina Speaks, FNP Referring diagnosis?  M76.61,M76.62 (ICD-10-CM) - Achilles tendinitis of both lower extremities  M62.462 (ICD-10-CM) - Gastrocnemius equinus of left lower extremity  M62.461 (ICD-10-CM) - Gastrocnemius equinus of right lower extremity   Treatment diagnosis? (if different than referring diagnosis) Pain in left ankle and joints of left foot [M25.572]   What was this (referring dx) caused by? Ongoing Issue, Arthritis, and Unspecified  Nature of Condition: Initial Onset (within last 3 months)   Laterality: Both  Current Functional Measure Score: LEFS 92.5%  Objective measurements identify impairments when they are compared to normal values, the uninvolved extremity, and prior level of function.  [x]  Yes  []  No  Objective assessment of functional ability: Minimal functional limitations   Briefly describe symptoms: Aching  How did symptoms start: Slow onset  Average pain intensity:  Last 24 hours: 5-6  Past week: 7-8  How often does the pt experience symptoms? Constantly  How much have the symptoms interfered with usual daily activities? A little bit  How has condition changed since care began at this facility? NA - initial  visit  In general, how is the patients overall health? Very Good   BACK PAIN (STarT Back Screening Tool) No

## 2024-08-12 ENCOUNTER — Ambulatory Visit
Admission: RE | Admit: 2024-08-12 | Discharge: 2024-08-12 | Disposition: A | Discharge status: Home / Self Care | Source: Ambulatory Visit | Attending: Internal Medicine | Admitting: Internal Medicine

## 2024-08-12 DIAGNOSIS — Z1231 Encounter for screening mammogram for malignant neoplasm of breast: Secondary | ICD-10-CM

## 2024-08-19 ENCOUNTER — Ambulatory Visit

## 2024-08-19 DIAGNOSIS — M25571 Pain in right ankle and joints of right foot: Secondary | ICD-10-CM

## 2024-08-19 DIAGNOSIS — M25572 Pain in left ankle and joints of left foot: Secondary | ICD-10-CM | POA: Diagnosis not present

## 2024-08-19 NOTE — Therapy (Signed)
 OUTPATIENT PHYSICAL THERAPY NOTE   Patient Name: Leah Robinson MRN: [id number] DOB:1951/01/26, 73 y.o., female Today's Date: 08/19/2024  END OF SESSION:  PT End of Session - 08/19/24 1048     Visit Number 3    Number of Visits 8    Date for PT Re-Evaluation 09/26/24    Authorization Type [employer]    Authorization Time Period Auth request 08/01/24    PT Start Time 1047    PT Stop Time 1125    PT Time Calculation (min) 38 min    Activity Tolerance Patient tolerated treatment well    Behavior During Therapy [facility] for tasks assessed/performed            Past Medical History:  Diagnosis Date   Arthritis    Diabetes mellitus without complication (HCC)    LLQ abdominal pain 02/08/2020   Past Surgical History:  Procedure Laterality Date   DILATATION & CURRETTAGE/HYSTEROSCOPY WITH RESECTOCOPE N/A 06/21/2015   Procedure: DILATATION & CURETTAGE/HYSTEROSCOPY WITH RESECTOCOPE;  Surgeon: Dickie Carder, MD;  Location: [facility];  Service: Gynecology;  Laterality: N/A;   ROBOTIC ASSISTED SALPINGO OOPHERECTOMY Left 06/21/2015   Procedure: ROBOTIC ASSISTED LEFT SALPINGO OOPHORECTOMY AND RIGHT SALPINGECTOMY With  Pelvic Washings;  Surgeon: Dickie Carder, MD;  Location: [facility];  Service: Gynecology;  Laterality: Left;  2 1/2 hrs.   TUBAL LIGATION     Patient Active Problem List   Diagnosis Date Noted   Class 1 obesity due to excess calories with body mass index (BMI) of 34.0 to 34.9 in adult 07/25/2024   COVID-19 vaccination declined 07/25/2024   Other insomnia 07/25/2024   BMI 35.0-35.9,adult 02/08/2024   Left upper quadrant abdominal mass 02/08/2024   Encounter for annual health examination 02/08/2024   Herpes zoster vaccination declined 08/19/2023   Influenza vaccination declined 08/19/2023   Vitamin D  deficiency 08/19/2023   Type 2 diabetes mellitus with diabetic peripheral angiopathy without gangrene, without long-term current use of insulin (HCC) 02/16/2023    Diverticular disease of colon 02/02/2023   Obesity, morbid (HCC) 02/02/2023   PAD (peripheral artery disease) (HCC) 01/20/2022   Calculus of gallbladder without cholecystitis without obstruction 02/22/2020   Hyperlipidemia 08/17/2017    PCP: Georgina Speaks, FNP  REFERRING PROVIDER: Silva Juliene SAUNDERS, DPM  REFERRING DIAG:  [phone] (ICD-10-CM) - Achilles tendinitis of both lower extremities  M62.462 (ICD-10-CM) - Gastrocnemius equinus of left lower extremity  M62.461 (ICD-10-CM) - Gastrocnemius equinus of right lower extremity    THERAPY DIAG:  Pain in left ankle and joints of left foot  Pain in right ankle and joints of right foot  Rationale for Evaluation and Treatment: Rehabilitation  ONSET DATE: ~1 month  SUBJECTIVE:   SUBJECTIVE STATEMENT:  08/19/2024 Patient reports compliance with HEP  EVAL: Pt reports arthritis in both of her feet. She reports pain in posterior heels. Has been using arthritis pain cream. Describes it as aching. Slow onset of pain. Exercises 5x/wk. Lifts weight, plays pickleball, swims ~45 min. Doesn't like getting on the floor and exercise.   PERTINENT HISTORY: DM  PAIN:  Are you having pain? Yes: NPRS scale: 5-6 currently; at worst 7-8 Pain location: posterior bil heels and top of foot thinks L may be worse than R Pain description: aching Aggravating factors: walking, getting up  Relieving factors: Tylenol   PRECAUTIONS: None  RED FLAGS: None   WEIGHT BEARING RESTRICTIONS: No  FALLS:  Has patient fallen in last 6 months? No  LIVING ENVIRONMENT: Lives with: lives alone Lives in:  House/apartment Stairs: No Has ramp and 2 steps to enter Has following equipment at home: None  OCCUPATION: Works 1 day/wk as a IT consultant  PLOF: Independent  PATIENT GOALS: Improve pain  NEXT MD VISIT: PRN  OBJECTIVE:  Note: Objective measures were completed at Evaluation unless otherwise noted.  DIAGNOSTIC FINDINGS: 07/18/24 Multiple views x-ray  of both feet: no fracture, dislocation, swelling or degenerative changes noted, plantar calcaneal spur, posterior calcaneal spur, and Haglund deformity noted   PATIENT SURVEYS:  Lower Extremity Functional Score: 74 / 80 = 92.5 %  COGNITION: Overall cognitive status: Within functional limits for tasks assessed     SENSATION: WFL; occasional tingling  EDEMA:  No swelling  MUSCLE LENGTH: Did not assess  POSTURE: No Significant postural limitations  PALPATION: Hypomobile L foot and ankle compared to R but both are grossly hypomobile No overt muscle tenderness to palpation  LOWER EXTREMITY ROM:  ROM Right eval Left eval  Hip flexion    Hip extension    Hip abduction    Hip adduction    Hip internal rotation    Hip external rotation    Knee flexion    Knee extension    Ankle dorsiflexion A: 0 P: 10 A: -10 P: 0  Ankle plantarflexion A: 38 A: 45  Ankle inversion A: 23 A: 15  Ankle eversion A: 20 A: 14   (Blank rows = not tested)  LOWER EXTREMITY MMT:  MMT Right eval Left eval  Hip flexion 3+ 3+  Hip extension 4- 4-  Hip abduction 3+ 3+  Hip adduction    Hip internal rotation    Hip external rotation    Knee flexion 5 5  Knee extension 5 5  Ankle dorsiflexion 5 5  Ankle plantarflexion 4+ Painful on leg 4-  Ankle inversion 5 5  Ankle eversion 5 5   (Blank rows = not tested)  LOWER EXTREMITY SPECIAL TESTS:  Did not assess  FUNCTIONAL TESTS:  SLS TBA  GAIT: Distance walked: Into clinic Assistive device utilized: None Level of assistance: Complete Independence Comments: Mildly antalgic with decreased toe off and heel strike bilat                                                                                                                                TREATMENT DATE:   [facility] Adult PT Treatment:                                                DATE: 08/19/2024  Therapeutic Exercise: NuStep x 5 minutes Review HEP   Seated dynadisc  Ankle pumps x 30  each  Ankle inversion/eversion x 30 each  Ankle circles x 15 each Marble Pickups 1/2 cup  Ankle 4-way RTB 2 x 10 each BIL    [facility] Adult PT  Treatment:                                                DATE: 08/09/2024  Therapeutic Exercise: Review HEP and provided red TB for resisted ankle  Seated dynadisc  Ankle pumps x 10 each  Ankle circles x 15 each Toe curl and spreading x 15 each direction  Towel scrunches x2 rounds (~5 min)  Ankle 4-way RTB 2 x 10 each BIL  Therapeutic Activity:  Patient education regarding importance of regular activity, and strength training for rehabilitation and healthy aging  Reviewed and discussed indications for use of heel lift and adjusting to lower height as able.      PATIENT EDUCATION:  Education details: Exam findings, POC, initial HEP, discussed heel lift use Person educated: Patient Education method: Explanation, Demonstration, and Handouts Education comprehension: verbalized understanding, returned demonstration, and needs further education  HOME EXERCISE PROGRAM: Access Code: [email or web address] URL: [email or web address] Date: 08/01/2024 Prepared by: Gellen April Marie Nonato  Exercises - Gastroc Stretch on Wall  - 1 x daily - 7 x weekly - 2 sets - 30 sec hold - Soleus Stretch on Wall  - 1 x daily - 7 x weekly - 2 sets - 30 sec hold - Heel Raises with Counter Support  - 3 x daily - 7 x weekly - 10 reps - 3 sec hold - Toe Raises with Counter Support  - 3 x daily - 7 x weekly - 10 reps - Toe Yoga - Alternating Great Toe and Lesser Toe Extension  - 3 x daily - 7 x weekly - 10 reps - Toe Spreading  - 3 x daily - 7 x weekly - 1 sets - 10 reps  ASSESSMENT:  CLINICAL IMPRESSION: 08/19/2024 Leah Robinson  had good tolerance of today's treatment session, including initiation of marble pick exercise. Patient had some difficulty with this, but was more successful than towel pick ups. We will continue to progress per POC as tolerated, in order to  reach established rehab goals.      EVAL: Patient is a 73 y.o. F who was seen today for physical therapy evaluation and treatment for bilat Achilles tendonitis. PMH significant for bilat bunions and DM. Assessment is significant for foot/ankle hypomobility leading to reduced ROM (L worse than R), gastroc/soleus weakness, intrinsic foot weakness, and gross bilat hip weakness affecting pt's standing and weight bearing activities. Pt will benefit from PT to keep from worsening pain and improve overall endurance to standing and walking without pain.   OBJECTIVE IMPAIRMENTS: Abnormal gait, decreased activity tolerance, decreased balance, decreased coordination, decreased endurance, decreased mobility, difficulty walking, decreased ROM, decreased strength, hypomobility, improper body mechanics, postural dysfunction, and pain.   ACTIVITY LIMITATIONS: sitting, standing, stairs, transfers, and locomotion level  PARTICIPATION LIMITATIONS: meal prep, cleaning, laundry, shopping, and community activity  PERSONAL FACTORS: Age, Fitness, Past/current experiences, and Time since onset of injury/illness/exacerbation are also affecting patient's functional outcome.   REHAB POTENTIAL: Good  CLINICAL DECISION MAKING: Evolving/moderate complexity  EVALUATION COMPLEXITY: Moderate   GOALS: Goals reviewed with patient? Yes  SHORT TERM GOALS: Target date: 08/29/2024  Pt will be ind with initial HEP Baseline: Goal status: INITIAL  2.  Pt will report improved pain by >/=50% Baseline:  Goal status: INITIAL    LONG TERM GOALS: Target date: 09/26/2024   Pt will be ind with management and progression  of HEP Baseline:  Goal status: INITIAL  2.  Pt will demo bilat ankle ROM to at least 10 deg of DF for improved calf muscle lengthening Baseline:  Goal status: INITIAL  3.  Pt will report improved pain by >/=75% for all activities Baseline:  Goal status: INITIAL  4.  Pt will have improved LEFS to 80/80  to demo MCID Baseline:  Goal status: INITIAL    PLAN:  PT FREQUENCY: 1x/week  PT DURATION: 8 weeks  PLANNED INTERVENTIONS: 97164- PT Re-evaluation, 97750- Physical Performance Testing, 97110-Therapeutic exercises, 97530- Therapeutic activity, [id number]- Neuromuscular re-education, 97535- Self Care, 02859- Manual therapy, G0283- Electrical stimulation (unattended), [id number]- Ionotophoresis 4mg /ml Dexamethasone , 79439 (1-2 muscles), 20561 (3+ muscles)- Dry Needling, Patient/Family education, Balance training, Stair training, Joint mobilization, Spinal mobilization, Cryotherapy, and Moist heat  PLAN FOR NEXT SESSION: Assess response to HEP. How is heel lift? Take one layer off? Continue ankle/foot/toe strengthening and stretching. Ankle/foot mobilizations. Add hip strengthening. Exercises she can perform in water (likes to swim).    Marko Molt, PT, DPT  08/19/2024 2:57 PM    Date of referral: 07/18/24 Referring provider: Georgina Speaks, FNP Referring diagnosis?  M76.61,M76.62 (ICD-10-CM) - Achilles tendinitis of both lower extremities  M62.462 (ICD-10-CM) - Gastrocnemius equinus of left lower extremity  M62.461 (ICD-10-CM) - Gastrocnemius equinus of right lower extremity   Treatment diagnosis? (if different than referring diagnosis) Pain in left ankle and joints of left foot [M25.572]   What was this (referring dx) caused by? Ongoing Issue, Arthritis, and Unspecified  Nature of Condition: Initial Onset (within last 3 months)   Laterality: Both  Current Functional Measure Score: LEFS 92.5%  Objective measurements identify impairments when they are compared to normal values, the uninvolved extremity, and prior level of function.  [x]  Yes  []  No  Objective assessment of functional ability: Minimal functional limitations   Briefly describe symptoms: Aching  How did symptoms start: Slow onset  Average pain intensity:  Last 24 hours: 5-6  Past week: 7-8  How often does the pt  experience symptoms? Constantly  How much have the symptoms interfered with usual daily activities? A little bit  How has condition changed since care began at this facility? NA - initial visit  In general, how is the patients overall health? Very Good   BACK PAIN (STarT Back Screening Tool) No

## 2024-08-22 ENCOUNTER — Emergency Department (HOSPITAL_BASED_OUTPATIENT_CLINIC_OR_DEPARTMENT_OTHER)
Admission: EM | Admit: 2024-08-22 | Discharge: 2024-08-22 | Disposition: A | Discharge status: Home / Self Care | Attending: Emergency Medicine | Admitting: Emergency Medicine

## 2024-08-22 ENCOUNTER — Other Ambulatory Visit: Payer: Self-pay

## 2024-08-22 DIAGNOSIS — S0990XA Unspecified injury of head, initial encounter: Secondary | ICD-10-CM | POA: Insufficient documentation

## 2024-08-22 DIAGNOSIS — R030 Elevated blood-pressure reading, without diagnosis of hypertension: Secondary | ICD-10-CM | POA: Insufficient documentation

## 2024-08-22 DIAGNOSIS — W228XXA Striking against or struck by other objects, initial encounter: Secondary | ICD-10-CM | POA: Diagnosis not present

## 2024-08-22 DIAGNOSIS — Z7984 Long term (current) use of oral hypoglycemic drugs: Secondary | ICD-10-CM | POA: Insufficient documentation

## 2024-08-22 DIAGNOSIS — E119 Type 2 diabetes mellitus without complications: Secondary | ICD-10-CM | POA: Insufficient documentation

## 2024-08-22 DIAGNOSIS — Z7982 Long term (current) use of aspirin: Secondary | ICD-10-CM | POA: Diagnosis not present

## 2024-08-22 NOTE — Discharge Instructions (Addendum)
 You were evaluated in the emergency room following a head injury.  Your exam was reassuring.  If you experience any new or worsening symptoms including nausea, vomiting, difficulty speaking, ambulating, vision changes please return emergency room.  You were additionally noted to have a elevated blood pressure here today in the ED.  Please follow-up with your primary care doctor within the next 1 to 2 weeks for repeat blood pressure check.

## 2024-08-22 NOTE — ED Triage Notes (Signed)
 Hit top of head over door frame of vehicle last Thursday. Denies deficits. C/o soreness in area.

## 2024-08-22 NOTE — ED Provider Notes (Signed)
 [place] EMERGENCY DEPARTMENT AT [facility] Provider Note   CSN: [id number] Arrival date & time: 08/22/24  1020     Patient presents with: Head Injury   Leah Robinson is a 73 y.o. female with noncontributory past medical history presents with complaints of head injury last Thursday.  Patient states she bumped her head on the car door.  No LOC.  Since then she has had some tenderness to the area.  Denies any headache, vision changes, difficulty ambulating or speaking, no extremity weakness, numbness, no nausea or vomiting.  Has been able to play pickle ball without any difficulty.  She is not on blood thinners.    Head Injury Associated symptoms: no headaches and no numbness    Past Medical History:  Diagnosis Date   Arthritis    Diabetes mellitus without complication (HCC)    LLQ abdominal pain 02/08/2020   Past Surgical History:  Procedure Laterality Date   DILATATION & CURRETTAGE/HYSTEROSCOPY WITH RESECTOCOPE N/A 06/21/2015   Procedure: DILATATION & CURETTAGE/HYSTEROSCOPY WITH RESECTOCOPE;  Surgeon: Dickie Carder, MD;  Location: [facility];  Service: Gynecology;  Laterality: N/A;   ROBOTIC ASSISTED SALPINGO OOPHERECTOMY Left 06/21/2015   Procedure: ROBOTIC ASSISTED LEFT SALPINGO OOPHORECTOMY AND RIGHT SALPINGECTOMY With  Pelvic Washings;  Surgeon: Dickie Carder, MD;  Location: [facility];  Service: Gynecology;  Laterality: Left;  2 1/2 hrs.   TUBAL LIGATION         Prior to Admission medications   Medication Sig Start Date End Date Taking? Authorizing Provider  Accu-Chek FastClix Lancets MISC USE TO CHECK BLOOD SUGAR ONCE DAILY 01/24/22   Trixie File, MD  aspirin  (ASPIRIN  ADULT LOW STRENGTH) 81 MG chewable tablet Chew 1 tablet (81 mg total) by mouth daily. 11/22/19   Georgina Speaks, FNP  atorvastatin  (LIPITOR) 10 MG tablet Take 1 tablet by mouth daily 07/25/24   Georgina Speaks, FNP  Blood Glucose Monitoring Suppl (ACCU-CHEK GUIDE) w/Device KIT 1 Device by  Does not apply route daily. 10/29/21   Gherghe, Cristina, MD  cholecalciferol (VITAMIN D ) 1000 UNITS tablet Take 1,000 Units by mouth daily.    [provider]  diclofenac  Sodium (VOLTAREN ) 1 % GEL Apply 4 grams topically 4 (four) times daily. 07/18/24   Silva Juliene SAUNDERS, DPM  glipiZIDE  (GLUCOTROL  XL) 5 MG 24 hr tablet Take 1 tablet (5 mg total) by mouth daily with breakfast. 01/21/24   Trixie File, MD  glucose blood (ACCU-CHEK GUIDE TEST) test strip Use as instructed 1-2x a day 01/21/24   Trixie File, MD  metFORMIN  (GLUCOPHAGE -XR) 500 MG 24 hr tablet Take 1-2 tablets by mouth with b'fast 01/21/24   Trixie File, MD  Multiple Vitamin (MULTIVITAMIN WITH MINERALS) TABS tablet Take 1 tablet by mouth daily.    [provider]  traZODone  (DESYREL ) 50 MG tablet Take 1 tablet (50 mg total) by mouth at bedtime as needed for sleep. 07/25/24   Georgina Speaks, FNP    Allergies: Patient has no known allergies.    Review of Systems  Neurological:  Negative for dizziness, numbness and headaches.    Updated Vital Signs BP (!) 162/73 (BP Location: Right Arm)   Pulse 63   Temp 98.6 F (37 C)   Resp 16   SpO2 97%   Physical Exam Vitals and nursing note reviewed.  Constitutional:      General: She is not in acute distress.    Appearance: She is well-developed.  HENT:     Head: Normocephalic and atraumatic.  Comments: Very minimal tenderness to frontal region, no wound, no postauricular or periorbital ecchymosis, no hemotympanum Eyes:     Conjunctiva/sclera: Conjunctivae normal.  Cardiovascular:     Rate and Rhythm: Normal rate and regular rhythm.     Heart sounds: No murmur heard. Pulmonary:     Effort: Pulmonary effort is normal. No respiratory distress.     Breath sounds: Normal breath sounds.  Musculoskeletal:        General: No swelling.     Cervical back: Neck supple.  Skin:    General: Skin is warm and dry.     Capillary Refill: Capillary refill takes  less than 2 seconds.  Neurological:     Mental Status: She is alert.     Comments: Patient is alert and oriented. There is no abnormal phonation. Symmetric smile without facial droop. Moves all extremities spontaneously. 5/5 strength in upper and lower extremities. . No sensation deficit. There is no nystagmus. EOMI, PERRL. Coordination intact with finger to nose and normal ambulation.    Psychiatric:        Mood and Affect: Mood normal.     (all labs ordered are listed, but only abnormal results are displayed) Labs Reviewed - No data to display  EKG: None  Radiology: No results found.   Procedures   Medications Ordered in the ED - No data to display  Clinical Course as of 08/22/24 1050  Mon Aug 22, 2024  1045 Patient evaluated following minor head injury last week.   Her exam is benign, without any neurodeficits.  Endorses very mild tenderness to the frontal region but is otherwise not associated with any other symptoms.  Noted to be hypertensive today, however it is asymptomatic.  Is not on blood pressure medication at home.  Do not feel that there is any indication to obtain lab work or advanced imaging here today.  Recommended following up with her PCP within the next week or 2 to repeat her blood pressure.  I provided strict return precautions including vomiting, vision changes, difficulty ambulating or speaking.  Patient is agreement with plan and will be discharged home. [JT]    Clinical Course User Index [JT] Donnajean Lynwood DEL, PA-C                                 Medical Decision Making  This patient presents to the ED with chief complaint(s) of head injury.  The complaint involves an extensive differential diagnosis and also carries with it a high risk of complications and morbidity.   Pertinent past medical history as listed in HPI  The differential diagnosis includes  Considered intracranial hemorrhage or cranial fracture however exam and history of minor head  injury is not consistent with this.  Additional history obtained: Records reviewed Care Everywhere/External Records  Disposition:   Patient will be discharged home. The patient has been appropriately medically screened and/or stabilized in the ED. I have low suspicion for any other emergent medical condition which would require further screening, evaluation or treatment in the ED or require inpatient management. At time of discharge the patient is hemodynamically stable and in no acute distress. I have discussed work-up results and diagnosis with patient and answered all questions. Patient is agreeable with discharge plan. We discussed strict return precautions for returning to the emergency department and they verbalized understanding.     Social Determinants of Health:   none  This note was  dictated with voice recognition software.  Despite best efforts at proofreading, errors may have occurred which can change the documentation meaning.       Final diagnoses:  Injury of head, initial encounter  Elevated blood pressure reading    ED Discharge Orders     None          Donnajean Lynwood VEAR DEVONNA 08/22/24 1050    Jerrol Lynwood, MD 08/22/24 1116

## 2024-08-26 ENCOUNTER — Ambulatory Visit: Attending: Podiatry

## 2024-08-26 DIAGNOSIS — M25571 Pain in right ankle and joints of right foot: Secondary | ICD-10-CM | POA: Insufficient documentation

## 2024-08-26 DIAGNOSIS — M25572 Pain in left ankle and joints of left foot: Secondary | ICD-10-CM | POA: Insufficient documentation

## 2024-08-26 NOTE — Therapy (Signed)
 OUTPATIENT PHYSICAL THERAPY NOTE   Patient Name: Leah Robinson  Latriece Anstine MRN: [id number] DOB:09-30-1951, 73 y.o., female Today's Date: 08/26/2024  END OF SESSION:      Past Medical History:  Diagnosis Date   Arthritis    Diabetes mellitus without complication (HCC)    LLQ abdominal pain 02/08/2020   Past Surgical History:  Procedure Laterality Date   DILATATION & CURRETTAGE/HYSTEROSCOPY WITH RESECTOCOPE N/A 06/21/2015   Procedure: DILATATION & CURETTAGE/HYSTEROSCOPY WITH RESECTOCOPE;  Surgeon: Dickie Carder, MD;  Location: [facility];  Service: Gynecology;  Laterality: N/A;   ROBOTIC ASSISTED SALPINGO OOPHERECTOMY Left 06/21/2015   Procedure: ROBOTIC ASSISTED LEFT SALPINGO OOPHORECTOMY AND RIGHT SALPINGECTOMY With  Pelvic Washings;  Surgeon: Dickie Carder, MD;  Location: [facility];  Service: Gynecology;  Laterality: Left;  2 1/2 hrs.   TUBAL LIGATION     Patient Active Problem List   Diagnosis Date Noted   Class 1 obesity due to excess calories with body mass index (BMI) of 34.0 to 34.9 in adult 07/25/2024   COVID-19 vaccination declined 07/25/2024   Other insomnia 07/25/2024   BMI 35.0-35.9,adult 02/08/2024   Left upper quadrant abdominal mass 02/08/2024   Encounter for annual health examination 02/08/2024   Herpes zoster vaccination declined 08/19/2023   Influenza vaccination declined 08/19/2023   Vitamin D  deficiency 08/19/2023   Type 2 diabetes mellitus with diabetic peripheral angiopathy without gangrene, without long-term current use of insulin (HCC) 02/16/2023   Diverticular disease of colon 02/02/2023   Obesity, morbid (HCC) 02/02/2023   PAD (peripheral artery disease) (HCC) 01/20/2022   Calculus of gallbladder without cholecystitis without obstruction 02/22/2020   Hyperlipidemia 08/17/2017    PCP: Georgina Speaks, FNP  REFERRING PROVIDER: Silva Juliene SAUNDERS, DPM  REFERRING DIAG:  [phone] (ICD-10-CM) - Achilles tendinitis of both lower extremities  M62.462  (ICD-10-CM) - Gastrocnemius equinus of left lower extremity  M62.461 (ICD-10-CM) - Gastrocnemius equinus of right lower extremity    THERAPY DIAG:  Pain in left ankle and joints of left foot  Pain in right ankle and joints of right foot  Rationale for Evaluation and Treatment: Rehabilitation  ONSET DATE: ~1 month  SUBJECTIVE:   SUBJECTIVE STATEMENT:  08/26/2024 Patient reports compliance with HEP. She had some improvement of symptoms after last visit. But noticed that her pain returned after wearing heels two days in a row.   EVAL: Pt reports arthritis in both of her feet. She reports pain in posterior heels. Has been using arthritis pain cream. Describes it as aching. Slow onset of pain. Exercises 5x/wk. Lifts weight, plays pickleball, swims ~45 min. Doesn't like getting on the floor and exercise.   PERTINENT HISTORY: DM  PAIN:  Are you having pain? Yes: NPRS scale: 5-6 currently; at worst 7-8 Pain location: posterior bil heels and top of foot thinks L may be worse than R Pain description: aching Aggravating factors: walking, getting up  Relieving factors: Tylenol   PRECAUTIONS: None  RED FLAGS: None   WEIGHT BEARING RESTRICTIONS: No  FALLS:  Has patient fallen in last 6 months? No  LIVING ENVIRONMENT: Lives with: lives alone Lives in: House/apartment Stairs: No Has ramp and 2 steps to enter Has following equipment at home: None  OCCUPATION: Works 1 day/wk as a IT consultant  PLOF: Independent  PATIENT GOALS: Improve pain  NEXT MD VISIT: PRN  OBJECTIVE:  Note: Objective measures were completed at Evaluation unless otherwise noted.  DIAGNOSTIC FINDINGS: 07/18/24 Multiple views x-ray of both feet: no fracture, dislocation, swelling or degenerative changes noted, plantar calcaneal  spur, posterior calcaneal spur, and Haglund deformity noted   PATIENT SURVEYS:  Lower Extremity Functional Score: 74 / 80 = 92.5 %  COGNITION: Overall cognitive status: Within  functional limits for tasks assessed     SENSATION: WFL; occasional tingling  EDEMA:  No swelling  MUSCLE LENGTH: Did not assess  POSTURE: No Significant postural limitations  PALPATION: Hypomobile L foot and ankle compared to R but both are grossly hypomobile No overt muscle tenderness to palpation  LOWER EXTREMITY ROM:  ROM Right eval Left eval  Hip flexion    Hip extension    Hip abduction    Hip adduction    Hip internal rotation    Hip external rotation    Knee flexion    Knee extension    Ankle dorsiflexion A: 0 P: 10 A: -10 P: 0  Ankle plantarflexion A: 38 A: 45  Ankle inversion A: 23 A: 15  Ankle eversion A: 20 A: 14   (Blank rows = not tested)  LOWER EXTREMITY MMT:  MMT Right eval Left eval  Hip flexion 3+ 3+  Hip extension 4- 4-  Hip abduction 3+ 3+  Hip adduction    Hip internal rotation    Hip external rotation    Knee flexion 5 5  Knee extension 5 5  Ankle dorsiflexion 5 5  Ankle plantarflexion 4+ Painful on leg 4-  Ankle inversion 5 5  Ankle eversion 5 5   (Blank rows = not tested)  LOWER EXTREMITY SPECIAL TESTS:  Did not assess  FUNCTIONAL TESTS:  SLS TBA  GAIT: Distance walked: Into clinic Assistive device utilized: None Level of assistance: Complete Independence Comments: Mildly antalgic with decreased toe off and heel strike bilat                                                                                                                                TREATMENT DATE:   [facility] Adult PT Treatment:                                                DATE: 08/26/2024   Therapeutic Exercise: NuStep level 5 x 5 minutes Seated dynadisc  Ankle PF into small ball x 30 each  Ankle inversion/eversion x 30 each  Ankle circles x 15 each Ankle DF, eversion, inversion GTB 2 x 10 each BIL Marble Pickups 1/2 cup  S/L hip abduction x 15 each side  SLR 2 x 10 each side         PATIENT EDUCATION:  Education details: Exam findings,  POC, initial HEP, discussed heel lift use Person educated: Patient Education method: Explanation, Demonstration, and Handouts Education comprehension: verbalized understanding, returned demonstration, and needs further education  HOME EXERCISE PROGRAM: Access Code: [email or web address] URL: [email or web address] Date: 08/26/2024 Prepared by: Marko Molt  Exercises -  Soleus Stretch on Wall  - 2 x daily - 2 sets - 30 sec hold - Heel Raises with Counter Support  - 2 x daily - 10 reps - 3 sec hold - Toe Raises with Counter Support  - 2 x daily - 10 reps - Toe Yoga - Alternating Great Toe and Lesser Toe Extension  - 2 x daily - 10 reps - Sidelying Hip Abduction  - 2 x daily - 2 sets - 10 reps - Supine Active Straight Leg Raise  - 2 x daily - 2 sets - 10 reps  ASSESSMENT:  CLINICAL IMPRESSION: 08/26/2024 Patient is demonstrating improved overall toe and ankle strength with exercises today. She was able to complete marble pickups at appropriate speed and will be able to d/c from this exercise at next visit. She will otherwise, benefit from increased time with WB activities to address ankle stabilization and eccentric loading as tolerated.        EVAL: Patient is a 73 y.o. F who was seen today for physical therapy evaluation and treatment for bilat Achilles tendonitis. PMH significant for bilat bunions and DM. Assessment is significant for foot/ankle hypomobility leading to reduced ROM (L worse than R), gastroc/soleus weakness, intrinsic foot weakness, and gross bilat hip weakness affecting pt's standing and weight bearing activities. Pt will benefit from PT to keep from worsening pain and improve overall endurance to standing and walking without pain.   OBJECTIVE IMPAIRMENTS: Abnormal gait, decreased activity tolerance, decreased balance, decreased coordination, decreased endurance, decreased mobility, difficulty walking, decreased ROM, decreased strength, hypomobility, improper body  mechanics, postural dysfunction, and pain.   ACTIVITY LIMITATIONS: sitting, standing, stairs, transfers, and locomotion level  PARTICIPATION LIMITATIONS: meal prep, cleaning, laundry, shopping, and community activity  PERSONAL FACTORS: Age, Fitness, Past/current experiences, and Time since onset of injury/illness/exacerbation are also affecting patient's functional outcome.   REHAB POTENTIAL: Good  CLINICAL DECISION MAKING: Evolving/moderate complexity  EVALUATION COMPLEXITY: Moderate   GOALS: Goals reviewed with patient? Yes  SHORT TERM GOALS: Target date: 08/29/2024  Pt will be ind with initial HEP Baseline: Goal status: INITIAL  2.  Pt will report improved pain by >/=50% Baseline:  Goal status: INITIAL    LONG TERM GOALS: Target date: 09/26/2024   Pt will be ind with management and progression of HEP Baseline:  Goal status: INITIAL  2.  Pt will demo bilat ankle ROM to at least 10 deg of DF for improved calf muscle lengthening Baseline:  Goal status: INITIAL  3.  Pt will report improved pain by >/=75% for all activities Baseline:  Goal status: INITIAL  4.  Pt will have improved LEFS to 80/80 to demo MCID Baseline:  Goal status: INITIAL    PLAN:  PT FREQUENCY: 1x/week  PT DURATION: 8 weeks  PLANNED INTERVENTIONS: 97164- PT Re-evaluation, 97750- Physical Performance Testing, 97110-Therapeutic exercises, 97530- Therapeutic activity, [id number]- Neuromuscular re-education, 97535- Self Care, 02859- Manual therapy, G0283- Electrical stimulation (unattended), [phone]- Ionotophoresis 4mg /ml Dexamethasone , 79439 (1-2 muscles), 20561 (3+ muscles)- Dry Needling, Patient/Family education, Balance training, Stair training, Joint mobilization, Spinal mobilization, Cryotherapy, and Moist heat  PLAN FOR NEXT SESSION: Continue ankle/foot/toe strengthening and stretching. Ankle/foot mobilizations. Add hip strengthening. Exercises she can perform in water (likes to swim).     Marko Molt, PT, DPT  08/28/2024 6:20 PM

## 2024-08-28 ENCOUNTER — Other Ambulatory Visit (HOSPITAL_COMMUNITY): Payer: Self-pay

## 2024-09-02 ENCOUNTER — Ambulatory Visit

## 2024-09-02 DIAGNOSIS — M25571 Pain in right ankle and joints of right foot: Secondary | ICD-10-CM

## 2024-09-02 DIAGNOSIS — M25572 Pain in left ankle and joints of left foot: Secondary | ICD-10-CM

## 2024-09-02 NOTE — Therapy (Signed)
 OUTPATIENT PHYSICAL THERAPY NOTE   Patient Name: Leah  Huxley Robinson MRN: [id number] DOB:May 24, 1951, 73 y.o., female Today's Date: 09/02/2024  END OF SESSION:  PT End of Session - 09/02/24 1002     Visit Number 5    Number of Visits 8    Date for PT Re-Evaluation 09/26/24    Authorization Type [employer]    Authorization Time Period Auth request 08/01/24    Progress Note Due on Visit 10    PT Start Time 1000    PT Stop Time 1038    PT Time Calculation (min) 38 min    Activity Tolerance Patient tolerated treatment well    Behavior During Therapy [facility] for tasks assessed/performed             Past Medical History:  Diagnosis Date   Arthritis    Diabetes mellitus without complication (HCC)    LLQ abdominal pain 02/08/2020   Past Surgical History:  Procedure Laterality Date   DILATATION & CURRETTAGE/HYSTEROSCOPY WITH RESECTOCOPE N/A 06/21/2015   Procedure: DILATATION & CURETTAGE/HYSTEROSCOPY WITH RESECTOCOPE;  Surgeon: Dickie Carder, MD;  Location: [facility];  Service: Gynecology;  Laterality: N/A;   ROBOTIC ASSISTED SALPINGO OOPHERECTOMY Left 06/21/2015   Procedure: ROBOTIC ASSISTED LEFT SALPINGO OOPHORECTOMY AND RIGHT SALPINGECTOMY With  Pelvic Washings;  Surgeon: Dickie Carder, MD;  Location: [facility];  Service: Gynecology;  Laterality: Left;  2 1/2 hrs.   TUBAL LIGATION     Patient Active Problem List   Diagnosis Date Noted   Class 1 obesity due to excess calories with body mass index (BMI) of 34.0 to 34.9 in adult 07/25/2024   COVID-19 vaccination declined 07/25/2024   Other insomnia 07/25/2024   BMI 35.0-35.9,adult 02/08/2024   Left upper quadrant abdominal mass 02/08/2024   Encounter for annual health examination 02/08/2024   Herpes zoster vaccination declined 08/19/2023   Influenza vaccination declined 08/19/2023   Vitamin D  deficiency 08/19/2023   Type 2 diabetes mellitus with diabetic peripheral angiopathy without gangrene, without long-term current use  of insulin (HCC) 02/16/2023   Diverticular disease of colon 02/02/2023   Obesity, morbid (HCC) 02/02/2023   PAD (peripheral artery disease) (HCC) 01/20/2022   Calculus of gallbladder without cholecystitis without obstruction 02/22/2020   Hyperlipidemia 08/17/2017    PCP: Georgina Speaks, FNP  REFERRING PROVIDER: Silva Juliene SAUNDERS, DPM  REFERRING DIAG:  [phone] (ICD-10-CM) - Achilles tendinitis of both lower extremities  M62.462 (ICD-10-CM) - Gastrocnemius equinus of left lower extremity  M62.461 (ICD-10-CM) - Gastrocnemius equinus of right lower extremity    THERAPY DIAG:  Pain in left ankle and joints of left foot  Pain in right ankle and joints of right foot  Rationale for Evaluation and Treatment: Rehabilitation  ONSET DATE: ~1 month  SUBJECTIVE:   SUBJECTIVE STATEMENT:  09/02/2024 Patient reports that her symptoms have been improving over the past week. She continues to be compliant with her HEP.   EVAL: Pt reports arthritis in both of her feet. She reports pain in posterior heels. Has been using arthritis pain cream. Describes it as aching. Slow onset of pain. Exercises 5x/wk. Lifts weight, plays pickleball, swims ~45 min. Doesn't like getting on the floor and exercise.   PERTINENT HISTORY: DM  PAIN:  Are you having pain? Yes: NPRS scale: 5-6 currently; at worst 7-8 Pain location: posterior bil heels and top of foot thinks L may be worse than R Pain description: aching Aggravating factors: walking, getting up  Relieving factors: Tylenol   PRECAUTIONS: None  RED FLAGS: None  WEIGHT BEARING RESTRICTIONS: No  FALLS:  Has patient fallen in last 6 months? No  LIVING ENVIRONMENT: Lives with: lives alone Lives in: House/apartment Stairs: No Has ramp and 2 steps to enter Has following equipment at home: None  OCCUPATION: Works 1 day/wk as a IT consultant  PLOF: Independent  PATIENT GOALS: Improve pain  NEXT MD VISIT: PRN  OBJECTIVE:  Note: Objective  measures were completed at Evaluation unless otherwise noted.  DIAGNOSTIC FINDINGS: 07/18/24 Multiple views x-ray of both feet: no fracture, dislocation, swelling or degenerative changes noted, plantar calcaneal spur, posterior calcaneal spur, and Haglund deformity noted   PATIENT SURVEYS:  Lower Extremity Functional Score: 74 / 80 = 92.5 %  COGNITION: Overall cognitive status: Within functional limits for tasks assessed     SENSATION: WFL; occasional tingling  EDEMA:  No swelling  MUSCLE LENGTH: Did not assess  POSTURE: No Significant postural limitations  PALPATION: Hypomobile L foot and ankle compared to R but both are grossly hypomobile No overt muscle tenderness to palpation  LOWER EXTREMITY ROM:  ROM Right eval Left eval  Hip flexion    Hip extension    Hip abduction    Hip adduction    Hip internal rotation    Hip external rotation    Knee flexion    Knee extension    Ankle dorsiflexion A: 0 P: 10 A: -10 P: 0  Ankle plantarflexion A: 38 A: 45  Ankle inversion A: 23 A: 15  Ankle eversion A: 20 A: 14   (Blank rows = not tested)  LOWER EXTREMITY MMT:  MMT Right eval Left eval  Hip flexion 3+ 3+  Hip extension 4- 4-  Hip abduction 3+ 3+  Hip adduction    Hip internal rotation    Hip external rotation    Knee flexion 5 5  Knee extension 5 5  Ankle dorsiflexion 5 5  Ankle plantarflexion 4+ Painful on leg 4-  Ankle inversion 5 5  Ankle eversion 5 5   (Blank rows = not tested)  LOWER EXTREMITY SPECIAL TESTS:  Did not assess  FUNCTIONAL TESTS:  SLS TBA  GAIT: Distance walked: Into clinic Assistive device utilized: None Level of assistance: Complete Independence Comments: Mildly antalgic with decreased toe off and heel strike bilat                                                                                                                                TREATMENT DATE:   [facility] Adult PT Treatment:                                                 DATE: 09/02/2024   Therapeutic Exercise: NuStep level 5 x 5 minutes Ankle PF, eversion, inversion BTB x 10 each BIL Standing heel toe raises x 20  Neuromuscular Reeducation: Airex feet together 2 x 30 sec  Airex feet tandem 2 x 30 sec each  Airex cone taps 2 x 30 sec each  Seated BAPS  BIL x 60 sec each PF, DF, circles         PATIENT EDUCATION:  Education details: Exam findings, POC, initial HEP, discussed heel lift use Person educated: Patient Education method: Explanation, Demonstration, and Handouts Education comprehension: verbalized understanding, returned demonstration, and needs further education  HOME EXERCISE PROGRAM: Access Code: [email or web address] URL: [email or web address] Date: 08/26/2024 Prepared by: Marko Molt  Exercises - Soleus Stretch on Wall  - 2 x daily - 2 sets - 30 sec hold - Heel Raises with Counter Support  - 2 x daily - 10 reps - 3 sec hold - Toe Raises with Counter Support  - 2 x daily - 10 reps - Toe Yoga - Alternating Great Toe and Lesser Toe Extension  - 2 x daily - 10 reps - Sidelying Hip Abduction  - 2 x daily - 2 sets - 10 reps - Supine Active Straight Leg Raise  - 2 x daily - 2 sets - 10 reps  ASSESSMENT:  CLINICAL IMPRESSION: 09/02/2024 Today's session focused on progression of weight bearing activities to challenge ankle stabilization. We also progressed from dynadisc AROM to use of BAPS board while seated. She had good overall tolerance of today's exercises and is encouraged to remain consistent with HEP during 2 week break.     EVAL: Patient is a 73 y.o. F who was seen today for physical therapy evaluation and treatment for bilat Achilles tendonitis. PMH significant for bilat bunions and DM. Assessment is significant for foot/ankle hypomobility leading to reduced ROM (L worse than R), gastroc/soleus weakness, intrinsic foot weakness, and gross bilat hip weakness affecting pt's standing and weight bearing activities. Pt  will benefit from PT to keep from worsening pain and improve overall endurance to standing and walking without pain.   OBJECTIVE IMPAIRMENTS: Abnormal gait, decreased activity tolerance, decreased balance, decreased coordination, decreased endurance, decreased mobility, difficulty walking, decreased ROM, decreased strength, hypomobility, improper body mechanics, postural dysfunction, and pain.   ACTIVITY LIMITATIONS: sitting, standing, stairs, transfers, and locomotion level  PARTICIPATION LIMITATIONS: meal prep, cleaning, laundry, shopping, and community activity  PERSONAL FACTORS: Age, Fitness, Past/current experiences, and Time since onset of injury/illness/exacerbation are also affecting patient's functional outcome.   REHAB POTENTIAL: Good  CLINICAL DECISION MAKING: Evolving/moderate complexity  EVALUATION COMPLEXITY: Moderate   GOALS: Goals reviewed with patient? Yes  SHORT TERM GOALS: Target date: 08/29/2024  Pt will be ind with initial HEP Baseline: Goal status: INITIAL  2.  Pt will report improved pain by >/=50% Baseline:  Goal status: INITIAL    LONG TERM GOALS: Target date: 09/26/2024   Pt will be ind with management and progression of HEP Baseline:  Goal status: INITIAL  2.  Pt will demo bilat ankle ROM to at least 10 deg of DF for improved calf muscle lengthening Baseline:  Goal status: INITIAL  3.  Pt will report improved pain by >/=75% for all activities Baseline:  Goal status: INITIAL  4.  Pt will have improved LEFS to 80/80 to demo MCID Baseline:  Goal status: INITIAL    PLAN:  PT FREQUENCY: 1x/week  PT DURATION: 8 weeks  PLANNED INTERVENTIONS: 97164- PT Re-evaluation, 97750- Physical Performance Testing, 97110-Therapeutic exercises, 97530- Therapeutic activity, [id number]- Neuromuscular re-education, 97535- Self Care, 02859- Manual therapy, H9716- Electrical stimulation (unattended), 02966- Ionotophoresis 4mg /ml Dexamethasone , 79439 (1-2 muscles),  79438 (  3+ muscles)- Dry Needling, Patient/Family education, Balance training, Stair training, Joint mobilization, Spinal mobilization, Cryotherapy, and Moist heat  PLAN FOR NEXT SESSION: Continue ankle/foot/toe strengthening and stretching. Ankle/foot mobilizations. Add hip strengthening. Exercises she can perform in water (likes to swim).    Marko Molt, PT, DPT  09/02/2024 10:47 AM

## 2024-09-05 ENCOUNTER — Ambulatory Visit: Admitting: Nurse Practitioner

## 2024-09-15 ENCOUNTER — Emergency Department (HOSPITAL_BASED_OUTPATIENT_CLINIC_OR_DEPARTMENT_OTHER): Admitting: Radiology

## 2024-09-15 ENCOUNTER — Other Ambulatory Visit: Payer: Self-pay

## 2024-09-15 ENCOUNTER — Emergency Department (HOSPITAL_BASED_OUTPATIENT_CLINIC_OR_DEPARTMENT_OTHER)
Admission: EM | Admit: 2024-09-15 | Discharge: 2024-09-15 | Disposition: A | Discharge status: Home / Self Care | Attending: Emergency Medicine | Admitting: Emergency Medicine

## 2024-09-15 DIAGNOSIS — E119 Type 2 diabetes mellitus without complications: Secondary | ICD-10-CM | POA: Insufficient documentation

## 2024-09-15 DIAGNOSIS — Z7984 Long term (current) use of oral hypoglycemic drugs: Secondary | ICD-10-CM | POA: Diagnosis not present

## 2024-09-15 DIAGNOSIS — Z7982 Long term (current) use of aspirin: Secondary | ICD-10-CM | POA: Diagnosis not present

## 2024-09-15 DIAGNOSIS — R051 Acute cough: Secondary | ICD-10-CM

## 2024-09-15 DIAGNOSIS — R059 Cough, unspecified: Secondary | ICD-10-CM | POA: Insufficient documentation

## 2024-09-15 LAB — RESP PANEL BY RT-PCR (RSV, FLU A&B, COVID)  RVPGX2
Influenza A by PCR: NEGATIVE
Influenza B by PCR: NEGATIVE
Resp Syncytial Virus by PCR: NEGATIVE
SARS Coronavirus 2 by RT PCR: NEGATIVE

## 2024-09-15 MED ORDER — AZITHROMYCIN 250 MG PO TABS: 250.0000 mg | ORAL_TABLET | Freq: Every day | ORAL | 0 refills | Status: AC

## 2024-09-15 MED ORDER — BENZONATATE 100 MG PO CAPS: 100.0000 mg | ORAL_CAPSULE | Freq: Three times a day (TID) | ORAL | 0 refills | Status: AC | PRN

## 2024-09-15 NOTE — Discharge Instructions (Signed)
 As discussed, your workup today was reassuring.  Chest x-ray showed no obvious pneumonia.  You have tested negative for COVID, flu, RSV.  Will send home with cough medicine as well as a Z-Pak.  Recommend follow-up with your primary care for reassessment.

## 2024-09-15 NOTE — ED Triage Notes (Signed)
 Pt POV reporting dry cough since Sunday, denies fever or congestion, no SOB.

## 2024-09-15 NOTE — ED Provider Notes (Signed)
 [place] EMERGENCY DEPARTMENT AT [facility] Provider Note   CSN: [id number] Arrival date & time: 09/15/24  8367     Patient presents with: Cough   Leah  Latrease Robinson is a 73 y.o. female.    Cough   73 year old female presents emergency department complaints of cough.  States that she has had a cough since Sunday.  It is worse when she lays down going to bed but other times it occurs throughout the day.  Denies any fevers, chills, difficulty breathing, sore throat, nasal congestion.  Denies any known sick contact.  Has tried Mucinex cough with some improvement of symptoms.  Presents emergency department for further assessment.  Past medical history significant for diabetes mellitus, arthritis, hyperlipidemia  Prior to Admission medications   Medication Sig Start Date End Date Taking? Authorizing Provider  azithromycin  (ZITHROMAX ) 250 MG tablet Take 1 tablet (250 mg total) by mouth daily. Take first 2 tablets together, then 1 every day until finished. 09/15/24  Yes Silver Fell A, PA  benzonatate  (TESSALON ) 100 MG capsule Take 1 capsule (100 mg total) by mouth 3 (three) times daily as needed. 09/15/24  Yes Silver Fell LABOR, PA  Accu-Chek FastClix Lancets MISC USE TO CHECK BLOOD SUGAR ONCE DAILY 01/24/22   Trixie File, MD  aspirin  (ASPIRIN  ADULT LOW STRENGTH) 81 MG chewable tablet Chew 1 tablet (81 mg total) by mouth daily. 11/22/19   Georgina Speaks, FNP  atorvastatin  (LIPITOR) 10 MG tablet Take 1 tablet by mouth daily 07/25/24   Georgina Speaks, FNP  Blood Glucose Monitoring Suppl (ACCU-CHEK GUIDE) w/Device KIT 1 Device by Does not apply route daily. 10/29/21   Gherghe, Cristina, MD  cholecalciferol (VITAMIN D ) 1000 UNITS tablet Take 1,000 Units by mouth daily.    [provider]  diclofenac  Sodium (VOLTAREN ) 1 % GEL Apply 4 grams topically 4 (four) times daily. 07/18/24   Silva Juliene SAUNDERS, DPM  glipiZIDE  (GLUCOTROL  XL) 5 MG 24 hr tablet Take 1 tablet (5 mg total) by  mouth daily with breakfast. 01/21/24   Trixie File, MD  glucose blood (ACCU-CHEK GUIDE TEST) test strip Use as instructed 1-2x a day 01/21/24   Trixie File, MD  metFORMIN  (GLUCOPHAGE -XR) 500 MG 24 hr tablet Take 1-2 tablets by mouth with b'fast 01/21/24   Trixie File, MD  Multiple Vitamin (MULTIVITAMIN WITH MINERALS) TABS tablet Take 1 tablet by mouth daily.    [provider]  traZODone  (DESYREL ) 50 MG tablet Take 1 tablet (50 mg total) by mouth at bedtime as needed for sleep. 07/25/24   Georgina Speaks, FNP    Allergies: Patient has no known allergies.    Review of Systems  Respiratory:  Positive for cough.   All other systems reviewed and are negative.   Updated Vital Signs BP (!) 154/71   Pulse 66   Temp 98 F (36.7 C)   Resp 17   Ht 5' 5 (1.651 m)   Wt 91.6 kg   SpO2 99%   BMI 33.61 kg/m   Physical Exam Vitals and nursing note reviewed.  Constitutional:      General: She is not in acute distress.    Appearance: She is well-developed.  HENT:     Head: Normocephalic and atraumatic.     Nose: No congestion or rhinorrhea.     Mouth/Throat:     Pharynx: No oropharyngeal exudate or posterior oropharyngeal erythema.  Eyes:     Conjunctiva/sclera: Conjunctivae normal.  Cardiovascular:     Rate and Rhythm: Normal rate  and regular rhythm.  Pulmonary:     Effort: Pulmonary effort is normal. No respiratory distress.     Breath sounds: Normal breath sounds. No stridor. No wheezing or rhonchi.     Comments: Possible faint rales auscultated right lower lung field. Abdominal:     Palpations: Abdomen is soft.     Tenderness: There is no abdominal tenderness.  Musculoskeletal:        General: No swelling.     Cervical back: Neck supple.  Skin:    General: Skin is warm and dry.     Capillary Refill: Capillary refill takes less than 2 seconds.  Neurological:     Mental Status: She is alert.  Psychiatric:        Mood and Affect: Mood normal.     (all  labs ordered are listed, but only abnormal results are displayed) Labs Reviewed  RESP PANEL BY RT-PCR (RSV, FLU A&B, COVID)  [id number]    EKG: None  Radiology: DG Chest 2 View Result Date: 09/15/2024 CLINICAL DATA:  Cough EXAM: DG CHEST 2V COMPARISON:  08/25/2012 FINDINGS: The heart size and mediastinal contours are within normal limits. Both lungs are clear. The visualized skeletal structures are unremarkable. IMPRESSION: No active cardiopulmonary disease. Electronically Signed   By: Franky Crease M.D.   On: 09/15/2024 19:10     Procedures   Medications Ordered in the ED - No data to display                                  Medical Decision Making Amount and/or Complexity of Data Reviewed Radiology: ordered.  Risk Prescription drug management.   This patient presents to the ED for concern of cough, this involves an extensive number of treatment options, and is a complaint that carries with it a high risk of complications and morbidity.  The differential diagnosis includes COVID, flu, RSV, malignancy, GERD, asthma, viral URI, pneumonia, other   Co morbidities that complicate the patient evaluation  See HPI   Additional history obtained:  Additional history obtained from EMR External records from outside source obtained and reviewed including hospital records   Lab Tests:  I Ordered, and personally interpreted labs.  The pertinent results include: Viral testing negative   Imaging Studies ordered:  I ordered imaging studies including chest x-ray I independently visualized and interpreted imaging which showed no acute cardiopulmonary abnormality I agree with the radiologist interpretation   Cardiac Monitoring: / EKG:  N/a   Consultations Obtained:  N/a   Problem List / ED Course / Critical interventions / Medication management  Cough Reevaluation of the patient after these medicines showed that the patient stayed the same I have reviewed the patients home  medicines and have made adjustments as needed   Social Determinants of Health:  Denies tobacco or illicit drug use.   Test / Admission - Considered:  Cough Vitals signs significant for hypertension blood pressure 154/71. Otherwise within normal range and stable throughout visit. Laboratory/imaging studies significant for: See above  73 year old female presents emergency department complaints of cough.  States that she has had a cough since Sunday.  It is worse when she lays down going to bed but other times it occurs throughout the day.  Denies any fevers, chills, difficulty breathing, sore throat, nasal congestion.  Denies any known sick contact.  Has tried Mucinex cough with some improvement of symptoms.  Presents emergency department for further assessment.  On exam, no abdominal tenderness.  Lungs with possible faint Rales auscultated in right lower lung field..  Workup today reassuring.  Viral testing negative.  Chest x-ray negative.  Suspect that symptoms could be secondary to viral URI.  Recommend supportive therapy as in AVS and follow-up with primary care.  No B symptoms to be suspicious for malignancy but does have remote history of cigarette use.  Cough not worsened with laying flat without evidence of pulmonary vascular congestion/pleural effusion or systemic volume overload; low suspicion for CHF.  Will trial cough medicine, additional symptomatic therapy as in AVS and follow-up with primary care.  Further emergent workup deemed necessary at this time.  Treatment plan discussed with patient and she knowledge understanding was agreeable.  Patient well-appearing, afebrile in no acute distress upon discharge. Worrisome signs and symptoms were discussed with the patient, and the patient acknowledged understanding to return to the ED if noticed. Patient was stable upon discharge.       Final diagnoses:  None          Silver Wonda LABOR, Kaylina 09/15/24 2004    Mannie Pac T,  DO 09/15/24 2254

## 2024-09-20 ENCOUNTER — Ambulatory Visit: Admitting: Podiatry

## 2024-09-20 DIAGNOSIS — M79671 Pain in right foot: Secondary | ICD-10-CM | POA: Diagnosis not present

## 2024-09-20 DIAGNOSIS — M62462 Contracture of muscle, left lower leg: Secondary | ICD-10-CM

## 2024-09-20 DIAGNOSIS — M7661 Achilles tendinitis, right leg: Secondary | ICD-10-CM | POA: Diagnosis not present

## 2024-09-20 DIAGNOSIS — M62461 Contracture of muscle, right lower leg: Secondary | ICD-10-CM | POA: Diagnosis not present

## 2024-09-20 DIAGNOSIS — M7662 Achilles tendinitis, left leg: Secondary | ICD-10-CM

## 2024-09-20 DIAGNOSIS — M79672 Pain in left foot: Secondary | ICD-10-CM

## 2024-09-20 NOTE — Progress Notes (Signed)
  Subjective:  Patient ID: Leah  Teresa Robinson, female    DOB: 06-07-1951,  MRN: [id number]  Chief Complaint  Patient presents with   Foot Pain    Bilateral Achilles tendonitis Pt says 45% better. Going to PT and doing stretches at home.   diabetic A1c 7.3. no anti coag    73 y.o. female presents with the above complaint. History confirmed with patient.  Starting to make significant improvement for her, the right side is doing expected to left-sided still tender  Objective:  Physical Exam: warm, good capillary refill, no trophic changes or ulcerative lesions, normal DP and PT pulses, and normal sensory exam. Left Foot: Minimal tenderness to the Achilles insertion today, still has equinus Right Foot: Mild to moderate tenderness at Achilles tendon insertion and gastrocnemius equinus is noted with a positive silverskiold test  No images are attached to the encounter.  Radiographs: Multiple views x-ray of both feet: no fracture, dislocation, swelling or degenerative changes noted, plantar calcaneal spur, posterior calcaneal spur, and Haglund deformity noted Assessment:   1. Heel pain, bilateral   2. Achilles tendinitis of both lower extremities   3. Gastrocnemius equinus of left lower extremity   4. Gastrocnemius equinus of right lower extremity       Plan:  Patient was evaluated and treated and all questions answered.  Starting improve she should continue utilizing her physical therapy and rehabilitation, we discussed addition of plan to inflammatory to help speed up the process she may also utilize OTC NSAIDs such as Aleve, we discussed the impact of possible prednisone taper on her blood sugar she would like to hold off on this for now continue PT.  Return in 2 months to reevaluate.  She will notify me if it worsens before then. Return in about 2 weeks (around 10/04/2024) for re-check Achilles tendon.

## 2024-09-21 ENCOUNTER — Ambulatory Visit: Payer: Self-pay | Attending: Podiatry

## 2024-09-21 DIAGNOSIS — M25571 Pain in right ankle and joints of right foot: Secondary | ICD-10-CM | POA: Insufficient documentation

## 2024-09-21 DIAGNOSIS — M25572 Pain in left ankle and joints of left foot: Secondary | ICD-10-CM | POA: Diagnosis present

## 2024-09-21 NOTE — Therapy (Incomplete)
 OUTPATIENT PHYSICAL THERAPY NOTE RECERTIFICATION  Progress Note  Reporting Period 08/01/2024 to 09/21/2024  See note below for Objective Data and Assessment of Progress/Goals.       Patient Name: Leah  Linnaea Robinson MRN: [id number] DOB:11/27/1951, 73 y.o., female Today's Date: 09/21/2024  END OF SESSION:    PT End of Session - 09/21/24 1447     Visit Number 6    Number of Visits 12    Date for Recertification  11/02/24    Authorization Type [employer]    PT Start Time 1447    PT Stop Time 1525    PT Time Calculation (min) 38 min    Activity Tolerance Patient tolerated treatment well    Behavior During Therapy WFL for tasks assessed/performed             Past Medical History:  Diagnosis Date   Arthritis    Diabetes mellitus without complication (HCC)    LLQ abdominal pain 02/08/2020   Past Surgical History:  Procedure Laterality Date   DILATATION & CURRETTAGE/HYSTEROSCOPY WITH RESECTOCOPE N/A 06/21/2015   Procedure: DILATATION & CURETTAGE/HYSTEROSCOPY WITH RESECTOCOPE;  Surgeon: Dickie Carder, MD;  Location: [facility];  Service: Gynecology;  Laterality: N/A;   ROBOTIC ASSISTED SALPINGO OOPHERECTOMY Left 06/21/2015   Procedure: ROBOTIC ASSISTED LEFT SALPINGO OOPHORECTOMY AND RIGHT SALPINGECTOMY With  Pelvic Washings;  Surgeon: Dickie Carder, MD;  Location: [facility];  Service: Gynecology;  Laterality: Left;  2 1/2 hrs.   TUBAL LIGATION     Patient Active Problem List   Diagnosis Date Noted   Class 1 obesity due to excess calories with body mass index (BMI) of 34.0 to 34.9 in adult 07/25/2024   COVID-19 vaccination declined 07/25/2024   Other insomnia 07/25/2024   BMI 35.0-35.9,adult 02/08/2024   Left upper quadrant abdominal mass 02/08/2024   Encounter for annual health examination 02/08/2024   Herpes zoster vaccination declined 08/19/2023   Influenza vaccination declined 08/19/2023   Vitamin D  deficiency 08/19/2023   Type 2 diabetes mellitus with  diabetic peripheral angiopathy without gangrene, without long-term current use of insulin (HCC) 02/16/2023   Diverticular disease of colon 02/02/2023   Obesity, morbid (HCC) 02/02/2023   PAD (peripheral artery disease) 01/20/2022   Calculus of gallbladder without cholecystitis without obstruction 02/22/2020   Hyperlipidemia 08/17/2017    PCP: Georgina Speaks, FNP  REFERRING PROVIDER: Silva Juliene SAUNDERS, DPM  REFERRING DIAG:  [phone] (ICD-10-CM) - Achilles tendinitis of both lower extremities  M62.462 (ICD-10-CM) - Gastrocnemius equinus of left lower extremity  M62.461 (ICD-10-CM) - Gastrocnemius equinus of right lower extremity    THERAPY DIAG:  Pain in left ankle and joints of left foot  Rationale for Evaluation and Treatment: Rehabilitation  ONSET DATE: ~1 month  SUBJECTIVE:   SUBJECTIVE STATEMENT:  09/21/2024 Patient reports that she has about 2-6/10 pain levels depending on what she is doing. She feels that she has been making good improvement and would like to continue with PT.   EVAL: Pt reports arthritis in both of her feet. She reports pain in posterior heels. Has been using arthritis pain cream. Describes it as aching. Slow onset of pain. Exercises 5x/wk. Lifts weight, plays pickleball, swims ~45 min. Doesn't like getting on the floor and exercise.   PERTINENT HISTORY: DM  PAIN:  Are you having pain? Yes: NPRS scale: 5-6 currently; at worst 7-8 Pain location: posterior bil heels and top of foot thinks L may be worse than R Pain description: aching Aggravating factors: walking, getting up  Relieving  factors: Tylenol   PRECAUTIONS: None  RED FLAGS: None   WEIGHT BEARING RESTRICTIONS: No  FALLS:  Has patient fallen in last 6 months? No  LIVING ENVIRONMENT: Lives with: lives alone Lives in: House/apartment Stairs: No Has ramp and 2 steps to enter Has following equipment at home: None  OCCUPATION: Works 1 day/wk as a IT consultant  PLOF:  Independent  PATIENT GOALS: Improve pain  NEXT MD VISIT: PRN  OBJECTIVE:  Note: Objective measures were completed at Evaluation unless otherwise noted.  DIAGNOSTIC FINDINGS: 07/18/24 Multiple views x-ray of both feet: no fracture, dislocation, swelling or degenerative changes noted, plantar calcaneal spur, posterior calcaneal spur, and Haglund deformity noted   PATIENT SURVEYS:  Lower Extremity Functional Score: 74 / 80 = 92.5 %  COGNITION: Overall cognitive status: Within functional limits for tasks assessed     SENSATION: WFL; occasional tingling  EDEMA:  No swelling  MUSCLE LENGTH: Did not assess  POSTURE: No Significant postural limitations  PALPATION: Hypomobile L foot and ankle compared to R but both are grossly hypomobile No overt muscle tenderness to palpation  LOWER EXTREMITY ROM:  ROM Right eval Left eval Right  09/21/24 Left 09/21/24  Hip flexion      Hip extension      Hip abduction      Hip adduction      Hip internal rotation      Hip external rotation      Knee flexion      Knee extension      Ankle dorsiflexion A: 0 P: 10 A: -10 P: 0 A: 5 A: 8  Ankle plantarflexion A: 38 A: 45 42 47  Ankle inversion A: 23 A: 15 30 27   Ankle eversion A: 20 A: 14 15 10    (Blank rows = not tested)  LOWER EXTREMITY MMT:  MMT Right eval Left eval Right 09/21/24 Left 09/21/24  Hip flexion 3+ 3+ 5 5  Hip extension 4- 4-    Hip abduction 3+ 3+ 5 5  Hip adduction      Hip internal rotation      Hip external rotation      Knee flexion 5 5    Knee extension 5 5    Ankle dorsiflexion 5 5 5 5   Ankle plantarflexion 4+ Painful on leg 4- 4+ 4+  Ankle inversion 5 5 5 5   Ankle eversion 5 5 5 5    (Blank rows = not tested)  LOWER EXTREMITY SPECIAL TESTS:  Did not assess  FUNCTIONAL TESTS:  SLS TBA  GAIT: Distance walked: Into clinic Assistive device utilized: None Level of assistance: Complete Independence Comments: Mildly antalgic with decreased toe off  and heel strike bilat                                                                                                                                TREATMENT DATE:   [facility] Adult PT Treatment:  DATE: 09/21/2024   Therapeutic Exercise: NuStep level 5 x 8 minutes  Therapeutic Activity:  Reassessment of objective measures and subjective assessment regarding progress towards established goals and updated plan for addressing remaining deficits and rehab goals.          PATIENT EDUCATION:  Education details: Exam findings, POC, initial HEP, discussed heel lift use Person educated: Patient Education method: Explanation, Demonstration, and Handouts Education comprehension: verbalized understanding, returned demonstration, and needs further education  HOME EXERCISE PROGRAM: Access Code: [email or web address] URL: [email or web address] Date: 08/26/2024 Prepared by: Marko Molt  Exercises - Soleus Stretch on Wall  - 2 x daily - 2 sets - 30 sec hold - Heel Raises with Counter Support  - 2 x daily - 10 reps - 3 sec hold - Toe Raises with Counter Support  - 2 x daily - 10 reps - Toe Yoga - Alternating Great Toe and Lesser Toe Extension  - 2 x daily - 10 reps - Sidelying Hip Abduction  - 2 x daily - 2 sets - 10 reps - Supine Active Straight Leg Raise  - 2 x daily - 2 sets - 10 reps  ASSESSMENT:  CLINICAL IMPRESSION: 09/21/2024 Patient has attended 6 PT sessions to address deficits related to referring dx of BIL achilles tendonitis. She is demonstrating good improvement of ankle AROM and strength since initial evaluation. Additional skilled PT is indicated at this time to address remaining DF ROM deficits, as well as improved tolerance of WB activities.   EVAL: Patient is a 73 y.o. F who was seen today for physical therapy evaluation and treatment for bilat Achilles tendonitis. PMH significant for bilat bunions and DM. Assessment is  significant for foot/ankle hypomobility leading to reduced ROM (L worse than R), gastroc/soleus weakness, intrinsic foot weakness, and gross bilat hip weakness affecting pt's standing and weight bearing activities. Pt will benefit from PT to keep from worsening pain and improve overall endurance to standing and walking without pain.     OBJECTIVE IMPAIRMENTS: Abnormal gait, decreased activity tolerance, decreased balance, decreased coordination, decreased endurance, decreased mobility, difficulty walking, decreased ROM, decreased strength, hypomobility, improper body mechanics, postural dysfunction, and pain.   ACTIVITY LIMITATIONS: sitting, standing, stairs, transfers, and locomotion level  PARTICIPATION LIMITATIONS: meal prep, cleaning, laundry, shopping, and community activity  PERSONAL FACTORS: Age, Fitness, Past/current experiences, and Time since onset of injury/illness/exacerbation are also affecting patient's functional outcome.   REHAB POTENTIAL: Good  CLINICAL DECISION MAKING: Evolving/moderate complexity  EVALUATION COMPLEXITY: Moderate   GOALS: Goals reviewed with patient? Yes  SHORT TERM GOALS: Target date: 08/29/2024  Pt will be ind with initial HEP Goal status: MET; at least 3 days/week   2.  Pt will report improved pain by >/=50% Baseline:  Goal status:ONGOING    LONG TERM GOALS: Target date: 11/02/2024   Pt will be ind with management and progression of HEP Baseline:  Goal status: MET  2.  Pt will demo bilat ankle ROM to at least 10 deg of DF for improved calf muscle lengthening Baseline: see objective measures Goal status: Progressing   3.  Pt will report improved pain by >/=75% for all activities Baseline:  Goal status: ONGOING  4.  Pt will have improved LEFS to 80/80 to demo MCID Baseline: 57/80 Goal status: ongoing    PLAN:  PT FREQUENCY: 1x/week  PT DURATION: 8 weeks  PLANNED INTERVENTIONS: 97164- PT Re-evaluation, 97750- Physical  Performance Testing, 97110-Therapeutic exercises, 97530- Therapeutic activity, [id number]- Neuromuscular re-education, 97535- Self Care, 02859- Manual therapy,  H9716- Electrical stimulation (unattended), [phone]- Ionotophoresis 4mg /ml Dexamethasone , 20560 (1-2 muscles), 20561 (3+ muscles)- Dry Needling, Patient/Family education, Balance training, Stair training, Joint mobilization, Spinal mobilization, Cryotherapy, and Moist heat  Date of referral: 07/18/24 Referring provider: Silva Juliene SAUNDERS, DPM Referring diagnosis?  M76.61,M76.62 (ICD-10-CM) - Achilles tendinitis of both lower extremities  M62.462 (ICD-10-CM) - Gastrocnemius equinus of left lower extremity  M62.461 (ICD-10-CM) - Gastrocnemius equinus of right lower extremity    Treatment diagnosis? (if different than referring diagnosis) Pain in left ankle and joints of left foot [M25.572]    What was this (referring dx) caused by? Ongoing Issue, Arthritis, and Unspecified   Nature of Condition: Initial Onset (within last 3 months)              Laterality: Both   Current Functional Measure Score: LEFS 71.5%   Objective measurements identify impairments when they are compared to normal values, the uninvolved extremity, and prior level of function.             [x]  Yes             []  No   Objective assessment of functional ability: Minimal functional limitations              Briefly describe symptoms: Aching   How did symptoms start: Slow onset   Average pain intensity:             Last 24 hours: 3/10              Past week: 6/10   How often does the pt experience symptoms? Constantly   How much have the symptoms interfered with usual daily activities? A little bit   How has condition changed since care began at this facility? Better   In general, how is the patients overall health? Very Good     BACK PAIN (STarT Back Screening Tool) No  PLAN FOR NEXT SESSION: continue progression of ankle DF ROM and WB tolerance   Marko Molt, PT, DPT  09/23/2024 10:12 AM

## 2024-09-23 NOTE — Addendum Note (Signed)
 Addended by: JOSHUA GUN on: 09/23/2024 10:26 AM   Modules accepted: Orders

## 2024-09-27 ENCOUNTER — Ambulatory Visit: Payer: Self-pay

## 2024-09-27 DIAGNOSIS — M25571 Pain in right ankle and joints of right foot: Secondary | ICD-10-CM

## 2024-09-27 DIAGNOSIS — M25572 Pain in left ankle and joints of left foot: Secondary | ICD-10-CM

## 2024-09-27 NOTE — Therapy (Signed)
 OUTPATIENT PHYSICAL THERAPY NOTE     Patient Name: Leah  Khristin Robinson MRN: [id number] DOB:16-Jul-1951, 73 y.o., female Today's Date: 09/27/2024  END OF SESSION:  PT End of Session - 09/27/24 1226     Visit Number 7    Number of Visits 12    Date for Recertification  11/02/24    Authorization Type [employer]    Authorization Time Period pending auth    Progress Note Due on Visit 10    PT Start Time 1222    PT Stop Time 1300    PT Time Calculation (min) 38 min    Activity Tolerance Patient tolerated treatment well    Behavior During Therapy WFL for tasks assessed/performed                Past Medical History:  Diagnosis Date   Arthritis    Diabetes mellitus without complication (HCC)    LLQ abdominal pain 02/08/2020   Past Surgical History:  Procedure Laterality Date   DILATATION & CURRETTAGE/HYSTEROSCOPY WITH RESECTOCOPE N/A 06/21/2015   Procedure: DILATATION & CURETTAGE/HYSTEROSCOPY WITH RESECTOCOPE;  Surgeon: Dickie Carder, MD;  Location: [facility];  Service: Gynecology;  Laterality: N/A;   ROBOTIC ASSISTED SALPINGO OOPHERECTOMY Left 06/21/2015   Procedure: ROBOTIC ASSISTED LEFT SALPINGO OOPHORECTOMY AND RIGHT SALPINGECTOMY With  Pelvic Washings;  Surgeon: Dickie Carder, MD;  Location: [facility];  Service: Gynecology;  Laterality: Left;  2 1/2 hrs.   TUBAL LIGATION     Patient Active Problem List   Diagnosis Date Noted   Class 1 obesity due to excess calories with body mass index (BMI) of 34.0 to 34.9 in adult 07/25/2024   COVID-19 vaccination declined 07/25/2024   Other insomnia 07/25/2024   BMI 35.0-35.9,adult 02/08/2024   Left upper quadrant abdominal mass 02/08/2024   Encounter for annual health examination 02/08/2024   Herpes zoster vaccination declined 08/19/2023   Influenza vaccination declined 08/19/2023   Vitamin D  deficiency 08/19/2023   Type 2 diabetes mellitus with diabetic peripheral angiopathy without gangrene, without long-term current use  of insulin (HCC) 02/16/2023   Diverticular disease of colon 02/02/2023   Obesity, morbid (HCC) 02/02/2023   PAD (peripheral artery disease) 01/20/2022   Calculus of gallbladder without cholecystitis without obstruction 02/22/2020   Hyperlipidemia 08/17/2017    PCP: Georgina Speaks, FNP  REFERRING PROVIDER: Silva Juliene SAUNDERS, DPM  REFERRING DIAG:  [phone] (ICD-10-CM) - Achilles tendinitis of both lower extremities  M62.462 (ICD-10-CM) - Gastrocnemius equinus of left lower extremity  M62.461 (ICD-10-CM) - Gastrocnemius equinus of right lower extremity    THERAPY DIAG:  Pain in left ankle and joints of left foot  Pain in right ankle and joints of right foot  Rationale for Evaluation and Treatment: Rehabilitation  ONSET DATE: ~1 month  SUBJECTIVE:   SUBJECTIVE STATEMENT:  09/27/2024 Patient reports that she has minimal pain today. She used the NuStep for 20 minutes this morning before pickleball.   EVAL: Pt reports arthritis in both of her feet. She reports pain in posterior heels. Has been using arthritis pain cream. Describes it as aching. Slow onset of pain. Exercises 5x/wk. Lifts weight, plays pickleball, swims ~45 min. Doesn't like getting on the floor and exercise.   PERTINENT HISTORY: DM  PAIN:  Are you having pain? Yes: NPRS scale: 5-6 currently; at worst 7-8 Pain location: posterior bil heels and top of foot thinks L may be worse than R Pain description: aching Aggravating factors: walking, getting up  Relieving factors: Tylenol   PRECAUTIONS: None  RED  FLAGS: None   WEIGHT BEARING RESTRICTIONS: No  FALLS:  Has patient fallen in last 6 months? No  LIVING ENVIRONMENT: Lives with: lives alone Lives in: House/apartment Stairs: No Has ramp and 2 steps to enter Has following equipment at home: None  OCCUPATION: Works 1 day/wk as a IT consultant  PLOF: Independent  PATIENT GOALS: Improve pain  NEXT MD VISIT: PRN  OBJECTIVE:  Note: Objective measures  were completed at Evaluation unless otherwise noted.  DIAGNOSTIC FINDINGS: 07/18/24 Multiple views x-ray of both feet: no fracture, dislocation, swelling or degenerative changes noted, plantar calcaneal spur, posterior calcaneal spur, and Haglund deformity noted   PATIENT SURVEYS:  Lower Extremity Functional Score: 74 / 80 = 92.5 %  COGNITION: Overall cognitive status: Within functional limits for tasks assessed     SENSATION: WFL; occasional tingling  EDEMA:  No swelling  MUSCLE LENGTH: Did not assess  POSTURE: No Significant postural limitations  PALPATION: Hypomobile L foot and ankle compared to R but both are grossly hypomobile No overt muscle tenderness to palpation  LOWER EXTREMITY ROM:  ROM Right eval Left eval Right  09/21/24 Left 09/21/24  Hip flexion      Hip extension      Hip abduction      Hip adduction      Hip internal rotation      Hip external rotation      Knee flexion      Knee extension      Ankle dorsiflexion A: 0 P: 10 A: -10 P: 0 A: 5 A: 8  Ankle plantarflexion A: 38 A: 45 42 47  Ankle inversion A: 23 A: 15 30 27   Ankle eversion A: 20 A: 14 15 10    (Blank rows = not tested)  LOWER EXTREMITY MMT:  MMT Right eval Left eval Right 09/21/24 Left 09/21/24  Hip flexion 3+ 3+ 5 5  Hip extension 4- 4-    Hip abduction 3+ 3+ 5 5  Hip adduction      Hip internal rotation      Hip external rotation      Knee flexion 5 5    Knee extension 5 5    Ankle dorsiflexion 5 5 5 5   Ankle plantarflexion 4+ Painful on leg 4- 4+ 4+  Ankle inversion 5 5 5 5   Ankle eversion 5 5 5 5    (Blank rows = not tested)  LOWER EXTREMITY SPECIAL TESTS:  Did not assess  FUNCTIONAL TESTS:  SLS TBA  GAIT: Distance walked: Into clinic Assistive device utilized: None Level of assistance: Complete Independence Comments: Mildly antalgic with decreased toe off and heel strike bilat                                                                                                                                 TREATMENT DATE:   [facility] Adult PT Treatment:  DATE: 09/27/2024  Therapeutic Exercise: NuStep level 5 x 5 minutes Standing heel toe raises x 30 Slant board calf stretch, 3 x 30 sec    Neuromuscular Reeducation: Airex feet together 2 x 30 sec  Airex feet tandem 2 x 30 sec each  Airex cone taps 2 x 30 sec each  Cone taps from airex pad (2 cones) 2 x 30 sec        PATIENT EDUCATION:  Education details: Exam findings, POC, initial HEP, discussed heel lift use Person educated: Patient Education method: Explanation, Demonstration, and Handouts Education comprehension: verbalized understanding, returned demonstration, and needs further education  HOME EXERCISE PROGRAM: Access Code: [email or web address] URL: [email or web address] Date: 08/26/2024 Prepared by: Marko Molt  Exercises - Soleus Stretch on Wall  - 2 x daily - 2 sets - 30 sec hold - Heel Raises with Counter Support  - 2 x daily - 10 reps - 3 sec hold - Toe Raises with Counter Support  - 2 x daily - 10 reps - Toe Yoga - Alternating Great Toe and Lesser Toe Extension  - 2 x daily - 10 reps - Sidelying Hip Abduction  - 2 x daily - 2 sets - 10 reps - Supine Active Straight Leg Raise  - 2 x daily - 2 sets - 10 reps  ASSESSMENT:  CLINICAL IMPRESSION: 09/27/2024 Evalynne  had improved tolerance of exercises today. Increased time spent with balance and WB activities on compliant surface. Plan is to continue with ankle DF AROM and stabilization progression as tolerated.   EVAL: Patient is a 73 y.o. F who was seen today for physical therapy evaluation and treatment for bilat Achilles tendonitis. PMH significant for bilat bunions and DM. Assessment is significant for foot/ankle hypomobility leading to reduced ROM (L worse than R), gastroc/soleus weakness, intrinsic foot weakness, and gross bilat hip weakness affecting pt's standing and weight  bearing activities. Pt will benefit from PT to keep from worsening pain and improve overall endurance to standing and walking without pain.     OBJECTIVE IMPAIRMENTS: Abnormal gait, decreased activity tolerance, decreased balance, decreased coordination, decreased endurance, decreased mobility, difficulty walking, decreased ROM, decreased strength, hypomobility, improper body mechanics, postural dysfunction, and pain.   ACTIVITY LIMITATIONS: sitting, standing, stairs, transfers, and locomotion level  PARTICIPATION LIMITATIONS: meal prep, cleaning, laundry, shopping, and community activity  PERSONAL FACTORS: Age, Fitness, Past/current experiences, and Time since onset of injury/illness/exacerbation are also affecting patient's functional outcome.   REHAB POTENTIAL: Good  CLINICAL DECISION MAKING: Evolving/moderate complexity  EVALUATION COMPLEXITY: Moderate   GOALS: Goals reviewed with patient? Yes  SHORT TERM GOALS: Target date: 08/29/2024  Pt will be ind with initial HEP Goal status: MET; at least 3 days/week   2.  Pt will report improved pain by >/=50% Baseline:  Goal status:ONGOING    LONG TERM GOALS: Target date: 11/02/2024   Pt will be ind with management and progression of HEP Baseline:  Goal status: MET  2.  Pt will demo bilat ankle ROM to at least 10 deg of DF for improved calf muscle lengthening Baseline: see objective measures Goal status: Progressing   3.  Pt will report improved pain by >/=75% for all activities Baseline:  Goal status: ONGOING  4.  Pt will have improved LEFS to 80/80 to demo MCID Baseline: 57/80 Goal status: ongoing    PLAN:  PT FREQUENCY: 1x/week  PT DURATION: 8 weeks  PLANNED INTERVENTIONS: 97164- PT Re-evaluation, 97750- Physical Performance Testing, 97110-Therapeutic exercises, 97530- Therapeutic activity, [id number]- Neuromuscular re-education, 97535-  Self Care, 02859- Manual therapy, G0283- Electrical stimulation (unattended),  [phone]- Ionotophoresis 4mg /ml Dexamethasone , 20560 (1-2 muscles), 20561 (3+ muscles)- Dry Needling, Patient/Family education, Balance training, Stair training, Joint mobilization, Spinal mobilization, Cryotherapy, and Moist heat  Date of referral: 07/18/24 Referring provider: Silva Juliene SAUNDERS, DPM Referring diagnosis?  M76.61,M76.62 (ICD-10-CM) - Achilles tendinitis of both lower extremities  M62.462 (ICD-10-CM) - Gastrocnemius equinus of left lower extremity  M62.461 (ICD-10-CM) - Gastrocnemius equinus of right lower extremity    Treatment diagnosis? (if different than referring diagnosis) Pain in left ankle and joints of left foot [M25.572]    What was this (referring dx) caused by? Ongoing Issue, Arthritis, and Unspecified   Nature of Condition: Initial Onset (within last 3 months)              Laterality: Both   Current Functional Measure Score: LEFS 71.5%   Objective measurements identify impairments when they are compared to normal values, the uninvolved extremity, and prior level of function.             [x]  Yes             []  No   Objective assessment of functional ability: Minimal functional limitations              Briefly describe symptoms: Aching   How did symptoms start: Slow onset   Average pain intensity:             Last 24 hours: 3/10              Past week: 6/10   How often does the pt experience symptoms? Constantly   How much have the symptoms interfered with usual daily activities? A little bit   How has condition changed since care began at this facility? Better   In general, how is the patients overall health? Very Good     BACK PAIN (STarT Back Screening Tool) No  PLAN FOR NEXT SESSION: continue progression of ankle DF ROM and WB tolerance   Marko Molt, PT, DPT  09/27/2024 1:55 PM

## 2024-10-09 NOTE — Therapy (Signed)
 OUTPATIENT PHYSICAL THERAPY NOTE RECERTIFICATION     Patient Name: Leah Robinson  Vicki Chaffin MRN: [id number] DOB:11/10/1951, 73 y.o., female Today's Date: 10/09/2024   PHYSICAL THERAPY DISCHARGE SUMMARY  Visits from Start of Care: ***   Current functional level related to goals / functional outcomes: See objective findings/assessment   Remaining deficits: See objective findings/assessment    Education / Equipment: See today's treatment/assessment      Patient agrees to discharge. Patient goals were {OP Goals:25702::met}. Patient is being discharged due to {OP Discharge Reasons:25703::meeting the stated rehab goals.}       END OF SESSION:          Past Medical History:  Diagnosis Date   Arthritis    Diabetes mellitus without complication (HCC)    LLQ abdominal pain 02/08/2020   Past Surgical History:  Procedure Laterality Date   DILATATION & CURRETTAGE/HYSTEROSCOPY WITH RESECTOCOPE N/A 06/21/2015   Procedure: DILATATION & CURETTAGE/HYSTEROSCOPY WITH RESECTOCOPE;  Surgeon: Dickie Carder, MD;  Location: [facility];  Service: Gynecology;  Laterality: N/A;   ROBOTIC ASSISTED SALPINGO OOPHERECTOMY Left 06/21/2015   Procedure: ROBOTIC ASSISTED LEFT SALPINGO OOPHORECTOMY AND RIGHT SALPINGECTOMY With  Pelvic Washings;  Surgeon: Dickie Carder, MD;  Location: [facility];  Service: Gynecology;  Laterality: Left;  2 1/2 hrs.   TUBAL LIGATION     Patient Active Problem List   Diagnosis Date Noted   Class 1 obesity due to excess calories with body mass index (BMI) of 34.0 to 34.9 in adult 07/25/2024   COVID-19 vaccination declined 07/25/2024   Other insomnia 07/25/2024   BMI 35.0-35.9,adult 02/08/2024   Left upper quadrant abdominal mass 02/08/2024   Encounter for annual health examination 02/08/2024   Herpes zoster vaccination declined 08/19/2023   Influenza vaccination declined 08/19/2023   Vitamin D  deficiency 08/19/2023   Type 2 diabetes mellitus with diabetic  peripheral angiopathy without gangrene, without long-term current use of insulin (HCC) 02/16/2023   Diverticular disease of colon 02/02/2023   Obesity, morbid (HCC) 02/02/2023   PAD (peripheral artery disease) 01/20/2022   Calculus of gallbladder without cholecystitis without obstruction 02/22/2020   Hyperlipidemia 08/17/2017    PCP: Georgina Speaks, FNP  REFERRING PROVIDER: Silva Juliene SAUNDERS, DPM  REFERRING DIAG:  [phone] (ICD-10-CM) - Achilles tendinitis of both lower extremities  M62.462 (ICD-10-CM) - Gastrocnemius equinus of left lower extremity  M62.461 (ICD-10-CM) - Gastrocnemius equinus of right lower extremity    THERAPY DIAG:  No diagnosis found.  Rationale for Evaluation and Treatment: Rehabilitation  ONSET DATE: ~1 month  SUBJECTIVE:   SUBJECTIVE STATEMENT:  10/09/2024 ***   EVAL: Pt reports arthritis in both of her feet. She reports pain in posterior heels. Has been using arthritis pain cream. Describes it as aching. Slow onset of pain. Exercises 5x/wk. Lifts weight, plays pickleball, swims ~45 min. Doesn't like getting on the floor and exercise.   PERTINENT HISTORY: DM  PAIN:  Are you having pain? Yes: NPRS scale: 5-6 currently; at worst 7-8 Pain location: posterior bil heels and top of foot thinks L may be worse than R Pain description: aching Aggravating factors: walking, getting up  Relieving factors: Tylenol   PRECAUTIONS: None  RED FLAGS: None   WEIGHT BEARING RESTRICTIONS: No  FALLS:  Has patient fallen in last 6 months? No  LIVING ENVIRONMENT: Lives with: lives alone Lives in: House/apartment Stairs: No Has ramp and 2 steps to enter Has following equipment at home: None  OCCUPATION: Works 1 day/wk as a IT consultant  PLOF: Independent  PATIENT GOALS: Improve pain  NEXT MD VISIT: PRN  OBJECTIVE:  Note: Objective measures were completed at Evaluation unless otherwise noted.  DIAGNOSTIC FINDINGS: 07/18/24 Multiple views x-ray of both  feet: no fracture, dislocation, swelling or degenerative changes noted, plantar calcaneal spur, posterior calcaneal spur, and Haglund deformity noted   PATIENT SURVEYS:  Lower Extremity Functional Score: 74 / 80 = 92.5 %  COGNITION: Overall cognitive status: Within functional limits for tasks assessed     SENSATION: WFL; occasional tingling  EDEMA:  No swelling  MUSCLE LENGTH: Did not assess  POSTURE: No Significant postural limitations  PALPATION: Hypomobile L foot and ankle compared to R but both are grossly hypomobile No overt muscle tenderness to palpation  LOWER EXTREMITY ROM:  ROM Right eval Left eval Right  09/21/24 Left 09/21/24  Hip flexion      Hip extension      Hip abduction      Hip adduction      Hip internal rotation      Hip external rotation      Knee flexion      Knee extension      Ankle dorsiflexion A: 0 P: 10 A: -10 P: 0 A: 5 A: 8  Ankle plantarflexion A: 38 A: 45 42 47  Ankle inversion A: 23 A: 15 30 27   Ankle eversion A: 20 A: 14 15 10    (Blank rows = not tested)  LOWER EXTREMITY MMT:  MMT Right eval Left eval Right 09/21/24 Left 09/21/24  Hip flexion 3+ 3+ 5 5  Hip extension 4- 4-    Hip abduction 3+ 3+ 5 5  Hip adduction      Hip internal rotation      Hip external rotation      Knee flexion 5 5    Knee extension 5 5    Ankle dorsiflexion 5 5 5 5   Ankle plantarflexion 4+ Painful on leg 4- 4+ 4+  Ankle inversion 5 5 5 5   Ankle eversion 5 5 5 5    (Blank rows = not tested)  LOWER EXTREMITY SPECIAL TESTS:  Did not assess  FUNCTIONAL TESTS:  SLS TBA  GAIT: Distance walked: Into clinic Assistive device utilized: None Level of assistance: Complete Independence Comments: Mildly antalgic with decreased toe off and heel strike bilat                                                                                                                                TREATMENT DATE:   [facility] Adult PT Treatment:                                                 DATE: 09/27/2024  Therapeutic Activity:  Reassessment of objective measures and subjective assessment regarding progress towards established goals and plan for independence with prescribed home program  following discharged from PT    Therapeutic Exercise: NuStep level 5 x 5 minutes Standing heel toe raises x 30 Slant board calf stretch, 3 x 30 sec    Neuromuscular Reeducation: Airex feet together 2 x 30 sec  Airex feet tandem 2 x 30 sec each  Airex cone taps 2 x 30 sec each  Cone taps from airex pad (2 cones) 2 x 30 sec        PATIENT EDUCATION:  Education details: Exam findings, POC, initial HEP, discussed heel lift use Person educated: Patient Education method: Explanation, Demonstration, and Handouts Education comprehension: verbalized understanding, returned demonstration, and needs further education  HOME EXERCISE PROGRAM: Access Code: [email or web address] URL: [email or web address] Date: 08/26/2024 Prepared by: Marko Molt  Exercises - Soleus Stretch on Wall  - 2 x daily - 2 sets - 30 sec hold - Heel Raises with Counter Support  - 2 x daily - 10 reps - 3 sec hold - Toe Raises with Counter Support  - 2 x daily - 10 reps - Toe Yoga - Alternating Great Toe and Lesser Toe Extension  - 2 x daily - 10 reps - Sidelying Hip Abduction  - 2 x daily - 2 sets - 10 reps - Supine Active Straight Leg Raise  - 2 x daily - 2 sets - 10 reps  ASSESSMENT:  CLINICAL IMPRESSION: 10/09/2024 @PATIENTNAME @ has attended *** visits since initial evaluation. Patient has made good progress with ***, and have met *** goals. They continue to have limited ***, and should continue with prescribed home program. Patient will be discharged from skilled PT at this time and should follow up with referring provider as needed.    EVAL: Patient is a 73 y.o. F who was seen today for physical therapy evaluation and treatment for bilat Achilles tendonitis. PMH significant for  bilat bunions and DM. Assessment is significant for foot/ankle hypomobility leading to reduced ROM (L worse than R), gastroc/soleus weakness, intrinsic foot weakness, and gross bilat hip weakness affecting pt's standing and weight bearing activities. Pt will benefit from PT to keep from worsening pain and improve overall endurance to standing and walking without pain.     OBJECTIVE IMPAIRMENTS: Abnormal gait, decreased activity tolerance, decreased balance, decreased coordination, decreased endurance, decreased mobility, difficulty walking, decreased ROM, decreased strength, hypomobility, improper body mechanics, postural dysfunction, and pain.   ACTIVITY LIMITATIONS: sitting, standing, stairs, transfers, and locomotion level  PARTICIPATION LIMITATIONS: meal prep, cleaning, laundry, shopping, and community activity  PERSONAL FACTORS: Age, Fitness, Past/current experiences, and Time since onset of injury/illness/exacerbation are also affecting patient's functional outcome.   REHAB POTENTIAL: Good  CLINICAL DECISION MAKING: Evolving/moderate complexity  EVALUATION COMPLEXITY: Moderate   GOALS: Goals reviewed with patient? Yes  SHORT TERM GOALS: Target date: 08/29/2024  Pt will be ind with initial HEP Goal status: MET; at least 3 days/week   2.  Pt will report improved pain by >/=50% Baseline:  Goal status:ONGOING    LONG TERM GOALS: Target date: 11/02/2024   Pt will be ind with management and progression of HEP Baseline:  Goal status: MET  2.  Pt will demo bilat ankle ROM to at least 10 deg of DF for improved calf muscle lengthening Baseline: see objective measures Goal status: Progressing   3.  Pt will report improved pain by >/=75% for all activities Baseline:  Goal status: ONGOING  4.  Pt will have improved LEFS to 80/80 to demo MCID Baseline: 57/80 Goal status: ongoing    PLAN:  PT  FREQUENCY: 1x/week  PT DURATION: 8 weeks  PLANNED INTERVENTIONS: 97164- PT  Re-evaluation, 97750- Physical Performance Testing, 97110-Therapeutic exercises, 97530- Therapeutic activity, [id number]- Neuromuscular re-education, 97535- Self Care, 02859- Manual therapy, G0283- Electrical stimulation (unattended), [phone]- Ionotophoresis 4mg /ml Dexamethasone , 20560 (1-2 muscles), 20561 (3+ muscles)- Dry Needling, Patient/Family education, Balance training, Stair training, Joint mobilization, Spinal mobilization, Cryotherapy, and Moist heat    PLAN FOR NEXT SESSION: continue progression of ankle DF ROM and WB tolerance   Marko Molt, PT, DPT  10/09/2024 10:02 PM

## 2024-10-10 ENCOUNTER — Ambulatory Visit

## 2024-10-10 DIAGNOSIS — M25572 Pain in left ankle and joints of left foot: Secondary | ICD-10-CM | POA: Diagnosis not present

## 2024-10-10 DIAGNOSIS — M25571 Pain in right ankle and joints of right foot: Secondary | ICD-10-CM

## 2024-10-18 ENCOUNTER — Encounter: Admitting: Physical Therapy

## 2024-10-25 ENCOUNTER — Ambulatory Visit

## 2024-10-25 NOTE — Progress Notes (Signed)
 SABRA

## 2024-11-01 ENCOUNTER — Ambulatory Visit

## 2024-11-21 ENCOUNTER — Encounter: Payer: Self-pay | Admitting: Podiatry

## 2024-11-21 ENCOUNTER — Ambulatory Visit: Admitting: Podiatry

## 2024-11-21 DIAGNOSIS — M7662 Achilles tendinitis, left leg: Secondary | ICD-10-CM

## 2024-11-21 DIAGNOSIS — M7661 Achilles tendinitis, right leg: Secondary | ICD-10-CM | POA: Diagnosis not present

## 2024-11-21 NOTE — Progress Notes (Signed)
  Subjective:  Patient ID: Leah Robinson  Teresa Ruder, female    DOB: 06/14/1951,  MRN: [id number]  Chief Complaint  Patient presents with   Tendonitis    It's a little better.  When I go down the stairs I feel it in my heels.    73 y.o. female presents with the above complaint. History confirmed with patient.  She has made some progression she has completed physical therapy probably but 50% better does not bother her mostly she still with pickleball and most of her daily activities [facility] tendons stationary bike stretching stairs etc.  Objective:  Physical Exam: warm, good capillary refill, no trophic changes or ulcerative lesions, normal DP and PT pulses, and normal sensory exam. Left Foot: Mild tenderness to the Achilles insertion today, still has equinus Right Foot: Mild to moderate tenderness at Achilles tendon insertion and gastrocnemius equinus is noted with a positive silverskiold test  No images are attached to the encounter.  Radiographs: Multiple views x-ray of both feet: no fracture, dislocation, swelling or degenerative changes noted, plantar calcaneal spur, posterior calcaneal spur, and Haglund deformity noted Assessment:   1. Achilles tendinitis of both lower extremities       Plan:  Patient was evaluated and treated and all questions answered.  Still tenderness most days with certain activities but not lifestyle-limiting and she is able to do her ADLs well still.  I recommend she continue her home physical therapy plan and exercises who comes lifestyle-limiting worsening and I would recommend MRIs.  I will follow-up with her in 3 months to reevaluate.  We discussed surgical options to remove the spur and has a deformity she does not feel like it is severe enough for this at that point and considering is not lifestyle-limiting I would agree with her Return in about 3 months (around 02/19/2025) for Follow-up on Achilles tendons.

## 2024-11-29 ENCOUNTER — Ambulatory Visit: Payer: Self-pay | Admitting: Nurse Practitioner

## 2024-11-30 ENCOUNTER — Ambulatory Visit: Admitting: Internal Medicine

## 2024-12-13 ENCOUNTER — Ambulatory Visit: Payer: Self-pay

## 2024-12-21 ENCOUNTER — Ambulatory Visit: Payer: Medicare Other

## 2024-12-21 ENCOUNTER — Ambulatory Visit: Payer: Medicare Other | Admitting: Family Medicine

## 2024-12-29 ENCOUNTER — Other Ambulatory Visit (HOSPITAL_COMMUNITY): Payer: Self-pay

## 2025-01-08 ENCOUNTER — Other Ambulatory Visit: Payer: Self-pay | Admitting: Internal Medicine

## 2025-02-08 ENCOUNTER — Encounter: Payer: Medicare Other | Admitting: Nurse Practitioner

## 2025-02-13 ENCOUNTER — Ambulatory Visit: Admitting: Internal Medicine

## 2025-02-14 ENCOUNTER — Ambulatory Visit: Payer: Self-pay | Admitting: Nurse Practitioner

## 2025-02-21 ENCOUNTER — Ambulatory Visit: Admitting: Podiatry

## 2025-02-22 ENCOUNTER — Ambulatory Visit
